# Patient Record
Sex: Female | Born: 1965 | Race: Black or African American | Hispanic: No | Smoking: Never smoker
Health system: Southern US, Community
[De-identification: ages and names within clinical notes are randomized; demographics above are authoritative.]

## PROBLEM LIST (undated history)

## (undated) DIAGNOSIS — R51 Headache: Secondary | ICD-10-CM

## (undated) DIAGNOSIS — F319 Bipolar disorder, unspecified: Secondary | ICD-10-CM

## (undated) DIAGNOSIS — D649 Anemia, unspecified: Secondary | ICD-10-CM

## (undated) DIAGNOSIS — E119 Type 2 diabetes mellitus without complications: Secondary | ICD-10-CM

## (undated) DIAGNOSIS — F419 Anxiety disorder, unspecified: Secondary | ICD-10-CM

## (undated) DIAGNOSIS — I10 Essential (primary) hypertension: Secondary | ICD-10-CM

## (undated) DIAGNOSIS — K589 Irritable bowel syndrome without diarrhea: Secondary | ICD-10-CM

## (undated) DIAGNOSIS — F99 Mental disorder, not otherwise specified: Secondary | ICD-10-CM

## (undated) DIAGNOSIS — F32A Depression, unspecified: Secondary | ICD-10-CM

## (undated) DIAGNOSIS — F329 Major depressive disorder, single episode, unspecified: Secondary | ICD-10-CM

## (undated) HISTORY — DX: Headache: R51

## (undated) HISTORY — PX: NO PAST SURGERIES: SHX2092

## (undated) HISTORY — DX: Irritable bowel syndrome, unspecified: K58.9

## (undated) HISTORY — DX: Type 2 diabetes mellitus without complications: E11.9

---

## 1997-11-27 ENCOUNTER — Emergency Department (HOSPITAL_COMMUNITY): Admission: EM | Admit: 1997-11-27 | Discharge: 1997-11-27 | Payer: Self-pay | Admitting: Emergency Medicine

## 1999-01-19 ENCOUNTER — Other Ambulatory Visit: Admission: RE | Admit: 1999-01-19 | Discharge: 1999-01-19 | Payer: Self-pay | Admitting: Obstetrics and Gynecology

## 1999-02-09 ENCOUNTER — Other Ambulatory Visit: Admission: RE | Admit: 1999-02-09 | Discharge: 1999-02-09 | Payer: Self-pay | Admitting: Obstetrics and Gynecology

## 1999-02-09 ENCOUNTER — Encounter (INDEPENDENT_AMBULATORY_CARE_PROVIDER_SITE_OTHER): Payer: Self-pay | Admitting: Specialist

## 2000-01-21 ENCOUNTER — Encounter: Admission: RE | Admit: 2000-01-21 | Discharge: 2000-01-21 | Payer: Self-pay | Admitting: Family Medicine

## 2000-06-23 ENCOUNTER — Encounter: Admission: RE | Admit: 2000-06-23 | Discharge: 2000-06-23 | Payer: Self-pay | Admitting: Family Medicine

## 2000-10-28 ENCOUNTER — Encounter: Admission: RE | Admit: 2000-10-28 | Discharge: 2000-10-28 | Payer: Self-pay | Admitting: Family Medicine

## 2001-11-07 ENCOUNTER — Encounter (INDEPENDENT_AMBULATORY_CARE_PROVIDER_SITE_OTHER): Payer: Self-pay | Admitting: *Deleted

## 2001-11-07 ENCOUNTER — Encounter: Admission: RE | Admit: 2001-11-07 | Discharge: 2001-11-07 | Payer: Self-pay | Admitting: Family Medicine

## 2001-11-15 ENCOUNTER — Encounter: Admission: RE | Admit: 2001-11-15 | Discharge: 2001-11-15 | Payer: Self-pay | Admitting: Sports Medicine

## 2001-11-15 ENCOUNTER — Encounter: Payer: Self-pay | Admitting: Sports Medicine

## 2001-11-29 ENCOUNTER — Encounter: Admission: RE | Admit: 2001-11-29 | Discharge: 2001-11-29 | Payer: Self-pay | Admitting: Family Medicine

## 2001-12-06 ENCOUNTER — Encounter: Admission: RE | Admit: 2001-12-06 | Discharge: 2001-12-06 | Payer: Self-pay | Admitting: Family Medicine

## 2001-12-13 ENCOUNTER — Encounter: Admission: RE | Admit: 2001-12-13 | Discharge: 2001-12-13 | Payer: Self-pay | Admitting: Family Medicine

## 2001-12-20 ENCOUNTER — Encounter: Admission: RE | Admit: 2001-12-20 | Discharge: 2001-12-20 | Payer: Self-pay | Admitting: Family Medicine

## 2002-01-16 ENCOUNTER — Encounter: Admission: RE | Admit: 2002-01-16 | Discharge: 2002-01-16 | Payer: Self-pay | Admitting: Family Medicine

## 2002-02-14 ENCOUNTER — Encounter: Admission: RE | Admit: 2002-02-14 | Discharge: 2002-02-14 | Payer: Self-pay | Admitting: Sports Medicine

## 2002-04-17 ENCOUNTER — Encounter: Admission: RE | Admit: 2002-04-17 | Discharge: 2002-04-17 | Payer: Self-pay | Admitting: Family Medicine

## 2002-10-18 ENCOUNTER — Encounter (INDEPENDENT_AMBULATORY_CARE_PROVIDER_SITE_OTHER): Payer: Self-pay | Admitting: *Deleted

## 2002-10-18 LAB — CONVERTED CEMR LAB

## 2002-11-07 ENCOUNTER — Encounter: Admission: RE | Admit: 2002-11-07 | Discharge: 2002-11-07 | Payer: Self-pay | Admitting: Family Medicine

## 2003-05-01 ENCOUNTER — Encounter: Admission: RE | Admit: 2003-05-01 | Discharge: 2003-05-01 | Payer: Self-pay | Admitting: Family Medicine

## 2003-11-04 ENCOUNTER — Emergency Department (HOSPITAL_COMMUNITY): Admission: EM | Admit: 2003-11-04 | Discharge: 2003-11-05 | Payer: Self-pay | Admitting: Emergency Medicine

## 2003-11-05 ENCOUNTER — Inpatient Hospital Stay (HOSPITAL_COMMUNITY): Admission: RE | Admit: 2003-11-05 | Discharge: 2003-11-12 | Payer: Self-pay | Admitting: Psychiatry

## 2004-07-26 ENCOUNTER — Inpatient Hospital Stay (HOSPITAL_COMMUNITY): Admission: EM | Admit: 2004-07-26 | Discharge: 2004-07-28 | Payer: Self-pay | Admitting: Emergency Medicine

## 2004-07-28 ENCOUNTER — Ambulatory Visit: Payer: Self-pay | Admitting: Psychiatry

## 2004-07-28 ENCOUNTER — Inpatient Hospital Stay (HOSPITAL_COMMUNITY): Admission: AD | Admit: 2004-07-28 | Discharge: 2004-08-02 | Payer: Self-pay | Admitting: Psychiatry

## 2006-06-16 DIAGNOSIS — N949 Unspecified condition associated with female genital organs and menstrual cycle: Secondary | ICD-10-CM | POA: Insufficient documentation

## 2006-06-16 DIAGNOSIS — B351 Tinea unguium: Secondary | ICD-10-CM | POA: Insufficient documentation

## 2006-06-16 DIAGNOSIS — L2089 Other atopic dermatitis: Secondary | ICD-10-CM | POA: Insufficient documentation

## 2006-06-16 DIAGNOSIS — N393 Stress incontinence (female) (male): Secondary | ICD-10-CM

## 2006-06-17 ENCOUNTER — Encounter (INDEPENDENT_AMBULATORY_CARE_PROVIDER_SITE_OTHER): Payer: Self-pay | Admitting: *Deleted

## 2007-03-23 ENCOUNTER — Encounter: Admission: RE | Admit: 2007-03-23 | Discharge: 2007-03-23 | Payer: Self-pay | Admitting: Family Medicine

## 2007-05-26 ENCOUNTER — Emergency Department (HOSPITAL_COMMUNITY): Admission: EM | Admit: 2007-05-26 | Discharge: 2007-05-26 | Payer: Self-pay | Admitting: Emergency Medicine

## 2010-09-04 NOTE — Discharge Summary (Signed)
NAMERUTHETTA, [name]:  [id number]   MEDICAL RECORD NO.:  [id number]          PATIENT TYPE:  IPS   LOCATION:  0307                          FACILITY:  [facility]   PHYSICIAN:  Jeanice Lim, M.D. DATE OF BIRTH:  [birth year]   DATE OF ADMISSION:  07/28/2004  DATE OF DISCHARGE:  08/02/2004                                 DISCHARGE SUMMARY   IDENTIFYING DATA:  A 45 year old separated African-American female with  history of intention overdose on Tylenol Saturday, July 22, 2004 at home.  Took #50 tablets in an attempt to kill herself.   MEDICATIONS:  Medications in the past Zoloft, Restoril and Xanax.  No  current medications, noncompliant with follow up.   ALLERGIES:  No known drug allergies.   PHYSICAL EXAMINATION:  Physical and neurologic exam within normal limits.   MENTAL STATUS EXAM:  Alert, overweight, cooperative, no eye contact.  Anxious ruminating, positive suicidal thoughts, ambivalent regarding her  current overdose.  Cognitive intact.  Judgment and insight impaired.   ADMISSION DIAGNOSES:  Axis I.  Depressive disorder, recurrent, severe, rule  out post-traumatic stress disorder.  Axis II.  Deferred.  Axis III.  None.  Axis IV.  Moderate.  Axis V.  30/55.   HOSPITAL COURSE:  The patient was admitted, ordered routine p.r.n.'s,  monitored for safety.  Encouraged to participate in therapy as well as the  importance of medication compliance was stressed and the patient reported  regretting overdose, mood improved and the patient was discharged in  improved condition with no suicidal ideation and appearing to have improved  coping skills.  Aware of the need for follow up and treatment.  Her  condition is treatable but untreated she is likely to take her own life and  the patient had no thoughts of this in the future or at the time of  discharge.  Received medication education.   Discharged on:  1.  Zoloft 50 mg daily.  2.  Lamictal 25 mg until August 10, 2004 and then 10 mg daily.  3.  Restoril 0.5 mg q. 8 p.m.   The patient is discharged.  Follow up at [facility] Wednesday, August 05, 2004.   DISCHARGE DIAGNOSES:  Axis I.  Depressive disorder, recurrent, severe, rule  out post-traumatic stress disorder.  Axis II.  Deferred.  Axis III.  None.  Axis IV.  Moderate.  Axis V.  On discharge 75.      JEM/MEDQ  D:  08/27/2004  T:  08/28/2004  Job:  [id number]
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
## 2011-06-16 ENCOUNTER — Ambulatory Visit (HOSPITAL_COMMUNITY)
Admission: RE | Admit: 2011-06-16 | Discharge: 2011-06-16 | Disposition: A | Discharge status: Home / Self Care | Payer: 59 | Attending: Psychiatry | Admitting: Psychiatry

## 2011-06-16 ENCOUNTER — Encounter (HOSPITAL_COMMUNITY): Payer: Self-pay | Admitting: *Deleted

## 2011-06-16 DIAGNOSIS — F411 Generalized anxiety disorder: Secondary | ICD-10-CM | POA: Insufficient documentation

## 2011-06-16 DIAGNOSIS — F4310 Post-traumatic stress disorder, unspecified: Secondary | ICD-10-CM | POA: Insufficient documentation

## 2011-06-16 DIAGNOSIS — F3130 Bipolar disorder, current episode depressed, mild or moderate severity, unspecified: Secondary | ICD-10-CM | POA: Insufficient documentation

## 2011-06-16 HISTORY — DX: Mental disorder, not otherwise specified: F99

## 2011-06-16 HISTORY — DX: Depression, unspecified: F32.A

## 2011-06-16 HISTORY — DX: Major depressive disorder, single episode, unspecified: F32.9

## 2011-06-16 HISTORY — DX: Anxiety disorder, unspecified: F41.9
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
## 2011-06-17 ENCOUNTER — Encounter (HOSPITAL_COMMUNITY): Payer: 59 | Attending: Psychiatry

## 2011-06-17 ENCOUNTER — Encounter (HOSPITAL_COMMUNITY): Payer: Self-pay

## 2011-06-17 DIAGNOSIS — F339 Major depressive disorder, recurrent, unspecified: Secondary | ICD-10-CM

## 2011-06-17 DIAGNOSIS — F4000 Agoraphobia, unspecified: Secondary | ICD-10-CM

## 2011-06-17 NOTE — Progress Notes (Signed)
    Daily Group Progress Note  Program: IOP  Group Time: 9:00-10:30 am   Participation Level: Active  Behavioral Response: Appropriate  Type of Therapy:  Process Group  Summary of Progress: Today was patients first day in the group. She appeared anxious, but observed the group process and began to participate by offering to read a small portion of information being presented during the group.      Group Time: 10:30 am - 12:00 pm   Participation Level:  Active  Behavioral Response: Appropriate  Type of Therapy: Psycho-education Group  Patient participated in a follow up discussion on barriers that impact [place] setting healthy boundaries and discussed ways to challenge negative feelings associated with limit setting.      Maxcine Ham, MSW, LCSW
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
## 2011-06-18 ENCOUNTER — Telehealth (HOSPITAL_COMMUNITY): Payer: Self-pay | Admitting: Psychiatry

## 2011-06-18 ENCOUNTER — Encounter (HOSPITAL_COMMUNITY): Payer: 59 | Attending: Psychiatry

## 2011-06-18 DIAGNOSIS — F4001 Agoraphobia with panic disorder: Secondary | ICD-10-CM | POA: Insufficient documentation

## 2011-06-18 DIAGNOSIS — F332 Major depressive disorder, recurrent severe without psychotic features: Secondary | ICD-10-CM | POA: Insufficient documentation

## 2011-06-21 ENCOUNTER — Encounter (HOSPITAL_COMMUNITY): Payer: 59

## 2011-06-21 DIAGNOSIS — F4001 Agoraphobia with panic disorder: Secondary | ICD-10-CM

## 2011-06-21 NOTE — Progress Notes (Signed)
   Daily Group Progress Note  Program: IOP  Group Time: 0900 - 1030  Participation Level: None  Behavioral Response: NA  Type of Therapy:  Psycho-education Group  Summary of Progress:  Patricia Rodriguez did not participate as she was late arriving     Group Time: 1045 - 1200  Participation Level:  Active  Behavioral Response: Appropriate and Sharing  Type of Therapy: Process Group  Summary of Progress:   Patricia Rodriguez arrived just as we started this group.  She reported she had trouble deciding to leave the house, but she made herself come and drove here on her own and did not have to stop for any reason on the way.  She patted herself on the back to coming.  She was on target with questions to other group members and shared a bit about her struggle with the recent onset of agoraphobia.     Shonna Chock, APRN, CNS  [facility]
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
## 2011-06-22 ENCOUNTER — Encounter (HOSPITAL_COMMUNITY): Payer: 59

## 2011-06-22 NOTE — Progress Notes (Signed)
    Daily Group Progress Note  Program: IOP  Group Time: 9:00-10:30 am   Participation Level: Active  Behavioral Response: Appropriate  Type of Therapy:  Process Group  Summary of Progress: Patient arrived thirty minutes late to group and described her struggle with phobia of leaving her house. Patient was attentive to the discussion but did not participate until called upon to share. Patient described difficulty leaving her home because she can't handle "negative" people. She describes holding in emotion over the years from negative encounters with others and how the inability to release them has caused her to isolate. Patient appears to be getting more comfortable in the group and less guarded.     Group Time: 10:30 am - 12:00 pm   Participation Level:  Active  Behavioral Response: Appropriate  Type of Therapy: Psycho-education Group  Summary of Progress: Patient was introduced to progressive muscle relaxation as a way to decrease stress and promote healthy sleep. Patient practiced the technique and identified how they could use it going forward.  Maxcine Ham, MSW, LCSW

## 2011-06-22 NOTE — Progress Notes (Signed)
Patient ID: Patricia Rodriguez, female   DOB: 1965-04-29, 46 y.o.   MRN: [id number] Pt seen states tolerating the increase in Risperdal 0.5 mg bid well.sleep still poor, discussed increasing Risperdal 0.5 mg am and 1 mg po q pm.

## 2011-06-23 ENCOUNTER — Encounter (HOSPITAL_COMMUNITY): Payer: 59

## 2011-06-24 ENCOUNTER — Encounter (HOSPITAL_COMMUNITY): Payer: 59

## 2011-06-24 NOTE — Progress Notes (Signed)
    Daily Group Progress Note  Program: IOP  Group Time: 9:00-10:30 am   Participation Level: Active  Behavioral Response: Appropriate  Type of Therapy:  Process Group  Summary of Progress: Patient arrived thirty minutes late to the group and described her continued difficulty with getting out of the house in the mornings from her anxiety. Group challenged her on her need to attend, but also provided support and understanding of her difficulty. Patient described how difficult it is for her to get out of bed when her depression is bad. She also said she feels "manic" today and her moods fluctuate.      Group Time: 10:30 am - 12:00 pm   Participation Level:  Active  Behavioral Response: Appropriate  Type of Therapy: Psycho-education Group  Summary of Progress:  Patient discussed depression symptoms and barriers to wellness.   Maxcine Ham, MSW, LCSW

## 2011-06-25 ENCOUNTER — Encounter (HOSPITAL_COMMUNITY): Payer: 59

## 2011-06-25 NOTE — Progress Notes (Signed)
    Daily Group Progress Note  Program: IOP  Group Time: 9:00-10:30 am   Participation Level: Active  Behavioral Response: Appropriate  Type of Therapy:  Process Group  Summary of Progress: Patient arrived on time for the group for the first time and received positive feedback from members on this achievement. Patient described fearing expressing her true feelings to others for fear of their response, but appears to be getting more comfortable in the group setting and on opening up more with others.      Group Time: 10:30 am - 12:00 pm   Participation Level:  Active  Behavioral Response: Appropriate  Type of Therapy: Psycho-education Group  Summary of Progress: Patient defined the term "crisis" and discussed healthy and unhealthy ways of managing during crisis times.   Maxcine Ham, MSW, LCSW

## 2011-06-28 ENCOUNTER — Encounter (HOSPITAL_COMMUNITY): Payer: 59

## 2011-06-28 NOTE — Progress Notes (Signed)
    Daily Group Progress Note  Program: IOP  Group Time: 9:00-10:30 am   Participation Level: Active  Behavioral Response: Appropriate  Type of Therapy:  Process Group  Summary of Progress: Patient arrived on time for group again today and was smiling as she described feeling "good". She is scheduled to end group tomorrow and states her depression has lessoned and she is looking forward to returning to work next week.      Group Time: 10:30 am - 12:00 pm   Participation Level:  Active  Behavioral Response: Appropriate  Type of Therapy: Psycho-education Group  Summary of Progress: Patient was educated on the five stages of grief and identified losses and the stages they are currently experience in the grief process.   Maxcine Ham, MSW, LCSW

## 2011-06-29 ENCOUNTER — Encounter (HOSPITAL_COMMUNITY): Payer: 59

## 2011-06-29 MED ORDER — RISPERIDONE 1 MG PO TABS: 1.0000 mg | ORAL_TABLET | Freq: Two times a day (BID) | ORAL | Status: DC

## 2011-06-29 NOTE — Patient Instructions (Signed)
Patient completed MH-IOP today.  Denies any suicidal ideations.  Reports improved symptoms (decreased depression and anxiety).  Has been applying coping skills that were learned.  Follow up with Abel Presto, [facility] (pt will schedule) and Jake Seats, NP on 06-30-11 @ 1pm.  Encouraged patient to attend support groups and to continue all medications as prescribed.

## 2011-06-29 NOTE — Progress Notes (Signed)
    Daily Group Progress Note  Program: IOP  Group Time: 9:00-10:30 am   Participation Level: Active  Behavioral Response: Appropriate  Type of Therapy:  Process Group  Summary of Progress: Patient shared for the first time today and today was her discharge day. Patient smiled and described wearing a mask and not really feeling any better and not being ready to make the changes necessary to start managing her depression symptoms. She states she would rather wear the mask and pretend everything is ok. Patient discussed her tendency to "overcare" for others resulting in depleting herself and her energy and leading her to attempt suicide in the past. Patient denied suicidal thoughts today and states she feels ready to return to her individual therapist and back to work tomorrow.      Group Time: 10:30 am - 12:00 pm   Participation Level:  Active  Behavioral Response: Appropriate  Type of Therapy: Psycho-education Group  Summary of Progress: Patient participated in a discussion and education segment on taking personal responsibility for mental health wellness and the importance of doing things daily to take control over ensuring overall good mental health.  Maxcine Ham, MSW, LCSW

## 2011-06-29 NOTE — Progress Notes (Unsigned)
[facility] Intensive Outpatient Program Discharge Summary  DEVANEY SEGERS [id number]     3/12/2013Discharge Note  Patient:  Patricia Rodriguez is an 46 y.o., female DOB:  10-12-1965  Date of Admission:  06/17/2011  Date of Discharge:  06/29/2011  Reason for Admission: Depression and agoraphobia  Hospital Course: 46 year old African American female admitted because of severe anxiety agoraphobia depression and panic attacks. Patient was on limited oh, Prozac, Xanax and Bentyl and was also on Risperdal 0.5 mg at at bedtime Patient's risperidone was increased to 0.5 mg twice a day and this helped her agarophobia. Her Risperdal was then increased again to point 5 in the morning and 1 mg at bedtime and this helped her anxiety and panic she was able to get out of her house much easily and her sleep improved. Patient began opening up in group and processing issues and conflicts especially in regards to her friendship. Her anxiety improved and she no longer felt hopeless and helpless. Her appetite was good. Overall she was coping well and doing well scheduled to have some initial anxiety getting out of the house and so her risperidone was increased to 0.5 mg by mouth twice a day. Her panic attacks dissipated She had no side effects from the medications and was coping well sleep and appetite was good mood was good no suicidal or homicidal ideation was present and it was decided to discharge  Mental Status at Discharge: Alert oriented x3, affect is full and bright speech was normal no suicidal or homicidal ideation was present no anxiety was evident. No hallucinations or delusions were present. Recent and remote memory was good judgment and insight were good concentration and recall were good. Suicidal risk minimal  Lab Results: No labs done Current outpatient prescriptions:ALPRAZolam (XANAX) 1 MG tablet, Take 1 mg by mouth 3 (three) times daily as needed., Disp: , Rfl: ;   butalbital-acetaminophen-caffeine (FIORICET WITH CODEINE) 50-325-40-30 MG per capsule, Take 1 capsule by mouth every 4 (four) hours as needed., Disp: , Rfl: ;  dicyclomine (BENTYL) 20 MG tablet, Take 20 mg by mouth every 6 (six) hours., Disp: , Rfl:  FLUoxetine HCl (PROZAC PO), Take 60 mg by mouth 1 day or 1 dose., Disp: , Rfl: ;  lamoTRIgine (LAMICTAL) 200 MG tablet, Take 200 mg by mouth 2 (two) times daily., Disp: , Rfl: ;  risperiDONE (RISPERDAL) 1 MG tablet, Take 1 tablet (1 mg total) by mouth 2 (two) times daily., Disp: 60 tablet, Rfl: 0  Axis Diagnosis:   Axis I: Major Depression, Recurrent severe.            Agorophobia, and panic attacks Axis II: Dependent Personality Traits Axis III:  Past Medical History  Diagnosis Date  . Mental disorder   . Anxiety   . Depression   . IBS (irritable bowel syndrome)   . Headache    Axis IV: economic problems, occupational problems, other psychosocial or environmental problems, problems related to social environment and problems with primary support group Axis V: 61-70 mild symptoms   Level of Care:  OP  Discharge destination:  Home  Is patient on multiple antipsychotic therapies at discharge:  No    Has Patient had three or more failed trials of antipsychotic monotherapy by history:  No  Patient phone:  [phone] (home)  Patient address:   [address] [place] [address],   Follow-up recommendations:  Activity:  As tolerated Diet:  Regular  Comments:  At the time of discharge  patient was not suicidal or homicidal risk and was not psychotic  The patient received suicide prevention pamphlet:  Yes Belongings returned:  Valuables  Margit Banda 06/29/2011, 3:55 PM

## 2011-06-30 ENCOUNTER — Encounter (HOSPITAL_COMMUNITY): Payer: 59

## 2011-07-01 ENCOUNTER — Encounter (HOSPITAL_COMMUNITY): Payer: 59

## 2011-07-02 ENCOUNTER — Encounter (HOSPITAL_COMMUNITY): Payer: 59

## 2011-07-05 ENCOUNTER — Encounter (HOSPITAL_COMMUNITY): Payer: 59

## 2011-07-06 ENCOUNTER — Encounter (HOSPITAL_COMMUNITY): Payer: 59

## 2011-07-07 ENCOUNTER — Encounter (HOSPITAL_COMMUNITY): Payer: 59

## 2011-07-08 ENCOUNTER — Encounter (HOSPITAL_COMMUNITY): Payer: 59

## 2011-07-09 ENCOUNTER — Encounter (HOSPITAL_COMMUNITY): Payer: 59

## 2011-07-12 ENCOUNTER — Encounter (HOSPITAL_COMMUNITY): Payer: 59

## 2011-07-13 ENCOUNTER — Encounter (HOSPITAL_COMMUNITY): Payer: 59

## 2011-07-14 ENCOUNTER — Encounter (HOSPITAL_COMMUNITY): Payer: 59

## 2011-07-19 ENCOUNTER — Emergency Department (HOSPITAL_COMMUNITY)
Admission: EM | Admit: 2011-07-19 | Discharge: 2011-07-19 | Disposition: A | Discharge status: Home / Self Care | Payer: 59 | Attending: Emergency Medicine | Admitting: Emergency Medicine

## 2011-07-19 ENCOUNTER — Encounter (HOSPITAL_COMMUNITY): Payer: Self-pay | Admitting: *Deleted

## 2011-07-19 DIAGNOSIS — F341 Dysthymic disorder: Secondary | ICD-10-CM | POA: Insufficient documentation

## 2011-07-19 DIAGNOSIS — R112 Nausea with vomiting, unspecified: Secondary | ICD-10-CM | POA: Insufficient documentation

## 2011-07-19 DIAGNOSIS — R1013 Epigastric pain: Secondary | ICD-10-CM | POA: Insufficient documentation

## 2011-07-19 DIAGNOSIS — R51 Headache: Secondary | ICD-10-CM

## 2011-07-19 DIAGNOSIS — Z79899 Other long term (current) drug therapy: Secondary | ICD-10-CM | POA: Insufficient documentation

## 2011-07-19 LAB — DIFFERENTIAL
Basophils Absolute: 0 10*3/uL (ref 0.0–0.1)
Basophils Relative: 0 % (ref 0–1)
Eosinophils Relative: 0 % (ref 0–5)
Monocytes Absolute: 0.5 10*3/uL (ref 0.1–1.0)
Neutro Abs: 8.8 10*3/uL — ABNORMAL HIGH (ref 1.7–7.7)

## 2011-07-19 LAB — CBC
HCT: 40 % (ref 36.0–46.0)
Hemoglobin: 12.9 g/dL (ref 12.0–15.0)
MCH: 27.7 pg (ref 26.0–34.0)
MCHC: 32.3 g/dL (ref 30.0–36.0)
MCV: 85.8 fL (ref 78.0–100.0)
Platelets: 480 10*3/uL — ABNORMAL HIGH (ref 150–400)
RBC: 4.66 MIL/uL (ref 3.87–5.11)
RDW: 13.7 % (ref 11.5–15.5)
WBC: 10.6 10*3/uL — ABNORMAL HIGH (ref 4.0–10.5)

## 2011-07-19 LAB — COMPREHENSIVE METABOLIC PANEL
AST: 16 U/L (ref 0–37)
Albumin: 3.6 g/dL (ref 3.5–5.2)
Calcium: 9.6 mg/dL (ref 8.4–10.5)
Chloride: 99 mEq/L (ref 96–112)
Creatinine, Ser: 0.79 mg/dL (ref 0.50–1.10)

## 2011-07-19 LAB — URINALYSIS, ROUTINE W REFLEX MICROSCOPIC
Glucose, UA: NEGATIVE mg/dL
Leukocytes, UA: NEGATIVE
pH: 7 (ref 5.0–8.0)

## 2011-07-19 LAB — PREGNANCY, URINE: Preg Test, Ur: NEGATIVE

## 2011-07-19 MED ORDER — DIPHENHYDRAMINE HCL 50 MG/ML IJ SOLN
25.0000 mg | INTRAMUSCULAR | Status: AC
  Administered 2011-07-19: 25 mg via INTRAVENOUS
  Filled 2011-07-19: qty 1

## 2011-07-19 MED ORDER — GI COCKTAIL ~~LOC~~
30.0000 mL | Freq: Once | ORAL | Status: AC
  Administered 2011-07-19: 30 mL via ORAL
  Filled 2011-07-19: qty 30

## 2011-07-19 MED ORDER — PANTOPRAZOLE SODIUM 40 MG PO TBEC: 40.0000 mg | DELAYED_RELEASE_TABLET | Freq: Every day | ORAL | Status: DC

## 2011-07-19 MED ORDER — METOCLOPRAMIDE HCL 5 MG/ML IJ SOLN
10.0000 mg | Freq: Once | INTRAMUSCULAR | Status: AC
  Administered 2011-07-19: 10 mg via INTRAVENOUS
  Filled 2011-07-19: qty 2

## 2011-07-19 MED ORDER — PANTOPRAZOLE SODIUM 40 MG PO TBEC
40.0000 mg | DELAYED_RELEASE_TABLET | Freq: Once | ORAL | Status: AC
  Administered 2011-07-19: 40 mg via ORAL
  Filled 2011-07-19: qty 1

## 2011-07-19 MED ORDER — ONDANSETRON HCL 4 MG/2ML IJ SOLN
4.0000 mg | Freq: Once | INTRAMUSCULAR | Status: AC
  Administered 2011-07-19: 4 mg via INTRAVENOUS
  Filled 2011-07-19: qty 2

## 2011-07-19 MED ORDER — SODIUM CHLORIDE 0.9 % IV SOLN
Freq: Once | INTRAVENOUS | Status: AC
  Administered 2011-07-19: 03:00:00 via INTRAVENOUS

## 2011-07-19 NOTE — ED Notes (Signed)
Pt c/o sharp upper abdominal pain along with vomiting that began Sunday night. Pt states the pain "comes and goes" but the nausea is constant. Pt has been able to keep water down but hasn't attempted to eat because of the nausea and vomiting.

## 2011-07-19 NOTE — Discharge Instructions (Signed)
Peptic Ulcers Ulcers are small, open craters or sores that develop in the lining of the stomach or the duodenum (the first part of the small intestine). The term peptic ulcer is used to describe both types of ulcers. There are a number of treatments that relieve the discomfort of ulcers. In most cases ulcers do heal.  CAUSES AND COMMON FEATURES OF PEPTIC ULCERS  Peptic ulcers occur only in areas of the digestive system that come in contact with digestive juices. These juices are secreted (given off) by the stomach. They include acid and an enzyme called pepsin that breaks down proteins. Many people with duodenal ulcers have too much digestive juice spilling down from the stomach. Most people with gastric (stomach) ulcers have normal or below normal amounts of stomach acid. Sometimes, when the mucous membrane (protective lining) of the stomach and duodenum does not protect well, this may add to the growth of ulcers. Duodenal ulcers often produces pain in a small area between the breastbone and navel. Pain varies from hunger pain to constant gnawing or burning sensations (feeling). Sometimes the pain is felt during sleep and may awaken the person in the middle of the night. Often the pain occurs two or three hours after eating, when the stomach is empty. Other common symptoms (problems) include overeating for pain relief. Eating relieves the pain of a duodenal ulcer. Gastric ulcer pain may be felt in the same place as the pain of a duodenal ulcer, or slightly higher up. There may also be sensations of feeling full, indigestion, and heartburn. Sometimes pain occurs when the stomach is full. This causes loss of appetite followed by weight loss. HOME CARE INSTRUCTIONS   Use of tobacco products have been found to slow down the healing of an ulcer. STOP SMOKING.   Avoid alcohol, aspirin, and other inflammation (swelling and soreness) reducing drugs. These substances weaken the stomach lining.   Eat regular,  nutritious meals.   Avoid foods that bother you.   Take medications and antacids as directed. Over-the-counter medications are used to neutralize stomach acid. Prescription medications reduce acid secretion, block acid production, or provide a protective coating over the ulcer. If a specific antacid was prescribed, do not switch brands without your caregiver's approval.  Surgery is usually not necessary. Diet and/or drug therapy usually is effective. Surgery may be necessary if perforation, obstruction due to scarring, or uncontrollable bleeding is found, or if severe pain is not otherwise controlled. SEEK IMMEDIATE MEDICAL CARE IF:  You see signs of bleeding. This includes vomiting fresh, bright red blood or passing bloody or tarry, black stools.   You suffer weakness, fatigue, or loss of consciousness. These symptoms can result from severe hemorrhaging (bleeding). Shock may result.   You have sudden, intense, severe abdominal (belly) pain. This is the first sign of a perforation. This would require immediate surgical treatment.   You have intense pain and continued vomiting. This could signal an obstruction of the digestive tract.  Document Released: 04/02/2000 Document Revised: 03/25/2011 Document Reviewed: 04/01/2008 [facility] Patient Information 2012 [place], [place].  Pantoprazole tablets What is this medicine? PANTOPRAZOLE (pan TOE pra zole) prevents the production of acid in the stomach. It is used to treat gastroesophageal reflux disease (GERD), inflammation of the esophagus, and Zollinger-Ellison syndrome. This medicine may be used for other purposes; ask your health care provider or pharmacist if you have questions. What should I tell my health care provider before I take this medicine? They need to know if you have any  of these conditions: -liver disease -low levels of magnesium in the blood -an unusual or allergic reaction to omeprazole, lansoprazole, pantoprazole, rabeprazole,  other medicines, foods, dyes, or preservatives -pregnant or trying to get pregnant -breast-feeding How should I use this medicine? Take this medicine by mouth. Swallow the tablets whole with a drink of water. Follow the directions on the prescription label. Do not crush, break, or chew. Take your medicine at regular intervals. Do not take your medicine more often than directed. Talk to your pediatrician regarding the use of this medicine in children. While this drug may be prescribed for children as young as 5 years for selected conditions, precautions do apply. Overdosage: If you think you have taken too much of this medicine contact a poison control center or emergency room at once. NOTE: This medicine is only for you. Do not share this medicine with others. What if I miss a dose? If you miss a dose, take it as soon as you can. If it is almost time for your next dose, take only that dose. Do not take double or extra doses. What may interact with this medicine? Do not take this medicine with any of the following medications: -atazanavir -nelfinavir This medicine may also interact with the following medications: -ampicillin -delavirdine -digoxin -diuretics -iron salts -medicines for fungal infections like ketoconazole, itraconazole and voriconazole -warfarin This list may not describe all possible interactions. Give your health care provider a list of all the medicines, herbs, non-prescription drugs, or dietary supplements you use. Also tell them if you smoke, drink alcohol, or use illegal drugs. Some items may interact with your medicine. What should I watch for while using this medicine? It can take several days before your stomach pain gets better. Check with your doctor or health care professional if your condition does not start to get better, or if it gets worse. You may need blood work done while you are taking this medicine. What side effects may I notice from receiving this  medicine? Side effects that you should report to your doctor or health care professional as soon as possible: -allergic reactions like skin rash, itching or hives, swelling of the face, lips, or tongue -bone, muscle or joint pain -breathing problems -chest pain or chest tightness -dark yellow or brown urine -dizziness -fast, irregular heartbeat -feeling faint or lightheaded -fever or sore throat -muscle spasm -palpitations -redness, blistering, peeling or loosening of the skin, including inside the mouth -seizures -tremors -unusual bleeding or bruising -unusually weak or tired -yellowing of the eyes or skin Side effects that usually do not require medical attention (Report these to your doctor or health care professional if they continue or are bothersome.): -constipation -diarrhea -dry mouth -headache -nausea This list may not describe all possible side effects. Call your doctor for medical advice about side effects. You may report side effects to FDA at 1-800-FDA-1088. Where should I keep my medicine? Keep out of the reach of children. Store at room temperature between 15 and 30 degrees C (59 and 86 degrees F). Protect from light and moisture. Throw away any unused medicine after the expiration date. NOTE: This sheet is a summary. It may not cover all possible information. If you have questions about this medicine, talk to your doctor, pharmacist, or health care provider.  2012, [employer]vier/[employer] Standard. (06/24/2009 12:03:53 PM)

## 2011-07-19 NOTE — ED Provider Notes (Signed)
History     CSN: [id number]  Arrival date & time 07/19/11  0028   First MD Initiated Contact with Patient 07/19/11 [phone]      Chief Complaint  Patient presents with  . Abdominal Pain  . Headache  . Nausea  . Emesis    (Consider location/radiation/quality/duration/timing/severity/associated sxs/prior treatment) Patient is a 46 y.o. female presenting with abdominal pain, headaches, and vomiting. The history is provided by the patient.  Abdominal Pain The primary symptoms of the illness include abdominal pain and vomiting.  Headache  Associated symptoms include vomiting.  Emesis  Associated symptoms include abdominal pain and headaches.  She had sudden onset at 1900 severe, sharp epigastric pain without radiation. Pain is waxed and waned but is generally at 10/10. There is associated nausea and vomiting. She does not feel any better after vomiting. She denies constipation or diarrhea. She denies fever, chills, sweats. At home, she did not notice anything affecting the pain-nothing made it better nothing made it worse. In the emergency department, she tried taking ice chips orally and pain got worse following that. She does not recall having had pain like this before. She does have a history of irritable bowel syndrome.  Past Medical History  Diagnosis Date  . Mental disorder   . Anxiety   . Depression   . IBS (irritable bowel syndrome)   . Headache     History reviewed. No pertinent past surgical history.  Family History  Problem Relation Age of Onset  . Depression Maternal Aunt     History  Substance Use Topics  . Smoking status: Unknown If Ever Smoked  . Smokeless tobacco: Not on file  . Alcohol Use: No    OB History    Grav Para Term Preterm Abortions TAB SAB Ect Mult Living                  Review of Systems  Gastrointestinal: Positive for vomiting and abdominal pain.  Neurological: Positive for headaches.  All other systems reviewed and are  negative.    Allergies  Vicodin  Home Medications   Current Outpatient Rx  Name Route Sig Dispense Refill  . ALPRAZOLAM 1 MG PO TABS Oral Take 1 mg by mouth 3 (three) times daily as needed.    Marland Kitchen BUTALBITAL-APAP-CAFF-COD 50-325-40-30 MG PO CAPS Oral Take 1 capsule by mouth every 4 (four) hours as needed.    Marland Kitchen DICYCLOMINE HCL 20 MG PO TABS Oral Take 20 mg by mouth every 6 (six) hours.    Marland Kitchen PROZAC PO Oral Take 60 mg by mouth 1 day or 1 dose.    Marland Kitchen LAMOTRIGINE 200 MG PO TABS Oral Take 200 mg by mouth 2 (two) times daily.    Marland Kitchen RISPERIDONE 1 MG PO TABS Oral Take 0.5 mg by mouth 2 (two) times daily.      BP 138/80  Pulse 75  Temp(Src) 99.4 F (37.4 C) (Oral)  Resp 20  SpO2 98%  Physical Exam  Nursing note and vitals reviewed.  46 year old female who is resting comfortably and in no acute distress. Vital signs are normal. Oxygen saturation is 98% which is normal. She is obese. Head is no stock and atraumatic. PERRLA, EOMI. There is no scleral icterus. Oropharynx is clear. Neck is nontender and supple. Back is nontender. Lungs are clear without rales, wheezes, or rhonchi. Heart has regular rate and rhythm without murmur. Abdomen is soft, flat, with some mild tenderness in the epigastric area without rebound or guarding. There  no masses or hepatosplenomegaly. Peristalsis is diminished but present. Extremities have no cyanosis or edema, full range of motion is present. Skin is warm and dry without rash. Neurologic: Mental status is normal, cranial nerves are intact, there no focal motor or sensory deficits.  ED Course  Procedures (including critical care time)  Results for orders placed during the hospital encounter of 07/19/11  CBC      Component Value Range   WBC 10.6 (*) 4.0 - 10.5 (K/uL)   RBC 4.66  3.87 - 5.11 (MIL/uL)   Hemoglobin 12.9  12.0 - 15.0 (g/dL)   HCT 16.1  09.6 - 04.5 (%)   MCV 85.8  78.0 - 100.0 (fL)   MCH 27.7  26.0 - 34.0 (pg)   MCHC 32.3  30.0 - 36.0 (g/dL)   RDW  40.9  81.1 - 91.4 (%)   Platelets 480 (*) 150 - 400 (K/uL)  DIFFERENTIAL      Component Value Range   Neutrophils Relative 83 (*) 43 - 77 (%)   Neutro Abs 8.8 (*) 1.7 - 7.7 (K/uL)   Lymphocytes Relative 12  12 - 46 (%)   Lymphs Abs 1.3  0.7 - 4.0 (K/uL)   Monocytes Relative 5  3 - 12 (%)   Monocytes Absolute 0.5  0.1 - 1.0 (K/uL)   Eosinophils Relative 0  0 - 5 (%)   Eosinophils Absolute 0.0  0.0 - 0.7 (K/uL)   Basophils Relative 0  0 - 1 (%)   Basophils Absolute 0.0  0.0 - 0.1 (K/uL)  COMPREHENSIVE METABOLIC PANEL      Component Value Range   Sodium 137  135 - 145 (mEq/L)   Potassium 4.3  3.5 - 5.1 (mEq/L)   Chloride 99  96 - 112 (mEq/L)   CO2 31  19 - 32 (mEq/L)   Glucose, Bld 110 (*) 70 - 99 (mg/dL)   BUN 9  6 - 23 (mg/dL)   Creatinine, Ser 7.82  0.50 - 1.10 (mg/dL)   Calcium 9.6  8.4 - 95.6 (mg/dL)   Total Protein 7.5  6.0 - 8.3 (g/dL)   Albumin 3.6  3.5 - 5.2 (g/dL)   AST 16  0 - 37 (U/L)   ALT 16  0 - 35 (U/L)   Alkaline Phosphatase 63  39 - 117 (U/L)   Total Bilirubin 0.2 (*) 0.3 - 1.2 (mg/dL)   GFR calc non Af Amer >90  >90 (mL/min)   GFR calc Af Amer >90  >90 (mL/min)  LIPASE, BLOOD      Component Value Range   Lipase 18  11 - 59 (U/L)  URINALYSIS, ROUTINE W REFLEX MICROSCOPIC      Component Value Range   Color, Urine YELLOW  YELLOW    APPearance CLOUDY (*) CLEAR    Specific Gravity, Urine 1.014  1.005 - 1.030    pH 7.0  5.0 - 8.0    Glucose, UA NEGATIVE  NEGATIVE (mg/dL)   Hgb urine dipstick NEGATIVE  NEGATIVE    Bilirubin Urine NEGATIVE  NEGATIVE    Ketones, ur NEGATIVE  NEGATIVE (mg/dL)   Protein, ur NEGATIVE  NEGATIVE (mg/dL)   Urobilinogen, UA 0.2  0.0 - 1.0 (mg/dL)   Nitrite NEGATIVE  NEGATIVE    Leukocytes, UA NEGATIVE  NEGATIVE   PREGNANCY, URINE      Component Value Range   Preg Test, Ur NEGATIVE  NEGATIVE    She got good relief of abdominal pain with a GI cocktail. Started complaining  of a migraine headache and was given a dose of metoclopramide  and diphenhydramine intravenously with good relief of her headache. She will be discharged with a prescription for proton ex.  1. Epigastric pain   2. Headache       MDM  Abdominal pain which seems most likely to be either gastritis or ulcer. She'll be given a therapeutic trial of a GI cocktail and laboratory workup initiated.        Dione Booze, MD 07/19/11 [phone]

## 2013-06-13 ENCOUNTER — Other Ambulatory Visit (HOSPITAL_COMMUNITY): Payer: 59 | Attending: Psychiatry | Admitting: Psychiatry

## 2013-06-13 ENCOUNTER — Encounter (HOSPITAL_COMMUNITY): Payer: Self-pay

## 2013-06-13 DIAGNOSIS — F411 Generalized anxiety disorder: Secondary | ICD-10-CM | POA: Insufficient documentation

## 2013-06-13 DIAGNOSIS — K589 Irritable bowel syndrome without diarrhea: Secondary | ICD-10-CM | POA: Insufficient documentation

## 2013-06-13 DIAGNOSIS — F429 Obsessive-compulsive disorder, unspecified: Secondary | ICD-10-CM

## 2013-06-13 DIAGNOSIS — F332 Major depressive disorder, recurrent severe without psychotic features: Secondary | ICD-10-CM | POA: Insufficient documentation

## 2013-06-13 DIAGNOSIS — F410 Panic disorder [episodic paroxysmal anxiety] without agoraphobia: Secondary | ICD-10-CM | POA: Insufficient documentation

## 2013-06-13 DIAGNOSIS — F4001 Agoraphobia with panic disorder: Secondary | ICD-10-CM

## 2013-06-13 DIAGNOSIS — F314 Bipolar disorder, current episode depressed, severe, without psychotic features: Secondary | ICD-10-CM

## 2013-06-13 MED ORDER — MIRTAZAPINE 15 MG PO TABS: 15.0000 mg | ORAL_TABLET | Freq: Every day | ORAL | Status: DC
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
## 2013-06-13 NOTE — Progress Notes (Signed)
    Daily Group Progress Note  Program: IOP  Group Time: 9:00-10:30  Participation Level: Active  Behavioral Response: Appropriate  Type of Therapy:  Group Therapy  Summary of Progress: Pt. Met with case manager and psychiatrist.     Group Time: 10:30-12:00  Participation Level:  Active  Behavioral Response: Appropriate  Type of Therapy: Psycho-education Group  Summary of Progress: Pt. Met with case manager and psychiatrist.  Zenon Leaf B, COUNS 

## 2013-06-14 ENCOUNTER — Other Ambulatory Visit (HOSPITAL_COMMUNITY): Payer: 59

## 2013-06-15 ENCOUNTER — Other Ambulatory Visit (HOSPITAL_COMMUNITY): Payer: 59 | Admitting: Psychiatry
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
## 2013-06-18 ENCOUNTER — Other Ambulatory Visit (HOSPITAL_COMMUNITY): Payer: 59 | Attending: Family Medicine

## 2013-06-18 DIAGNOSIS — F3130 Bipolar disorder, current episode depressed, mild or moderate severity, unspecified: Secondary | ICD-10-CM | POA: Insufficient documentation

## 2013-06-18 DIAGNOSIS — F4310 Post-traumatic stress disorder, unspecified: Secondary | ICD-10-CM | POA: Insufficient documentation

## 2013-06-18 DIAGNOSIS — K589 Irritable bowel syndrome without diarrhea: Secondary | ICD-10-CM | POA: Insufficient documentation

## 2013-06-18 DIAGNOSIS — F411 Generalized anxiety disorder: Secondary | ICD-10-CM | POA: Insufficient documentation

## 2013-06-19 ENCOUNTER — Encounter (HOSPITAL_COMMUNITY): Payer: Self-pay | Admitting: Psychiatry

## 2013-06-19 ENCOUNTER — Other Ambulatory Visit (HOSPITAL_COMMUNITY): Payer: 59 | Admitting: Psychiatry

## 2013-06-19 DIAGNOSIS — F32A Depression, unspecified: Secondary | ICD-10-CM

## 2013-06-19 DIAGNOSIS — F329 Major depressive disorder, single episode, unspecified: Secondary | ICD-10-CM

## 2013-06-19 NOTE — Progress Notes (Signed)
    Daily Group Progress Note  Program: IOP  Group Time: 9:00-10:30  Participation Level: Active  Behavioral Response: Appropriate  Type of Therapy:  Group Therapy  Summary of Progress: Pt. Discussed history of depression, work-related stress, tendency toward perfectionism and not reaching out to others for support. Pt. Also discussed relationship with dependent mother, caregiving for adult step-daughter and managing contentious relationship with step-daughter's biological mother.   Group Time: 10:30-12:00  Participation Level:  Active  Behavioral Response: Appropriate  Type of Therapy: Psycho-education Group  Summary of Progress: Pt. Participated in group focused on developing healthy boundaries.  Nancie Neas, COUNS

## 2013-06-20 ENCOUNTER — Encounter (HOSPITAL_COMMUNITY): Payer: Self-pay | Admitting: Psychiatry

## 2013-06-20 ENCOUNTER — Other Ambulatory Visit (HOSPITAL_COMMUNITY): Payer: 59 | Admitting: Psychiatry

## 2013-06-20 DIAGNOSIS — F32A Depression, unspecified: Secondary | ICD-10-CM

## 2013-06-20 DIAGNOSIS — F329 Major depressive disorder, single episode, unspecified: Secondary | ICD-10-CM

## 2013-06-20 NOTE — Progress Notes (Signed)
    Daily Group Progress Note  Program: IOP  Group Time: 9:00-10:30  Participation Level: Minimal  Behavioral Response: Drowsy  Type of Therapy:  Group Therapy  Summary of Progress: Pt. Participated in heartmath and guided visualization. Pt. Continues to communicate positive attitude toward the group process and demonstrates motivation to address stressors and build coping skills. Pt. Presented as drowsy and apologized to the group for falling asleep. Pt. Attributed drowsiness to difficulty sleeping last night.     Group Time: 10:30-12:00  Participation Level:  Minimal  Behavioral Response:  Drowsy  Type of Therapy: Psycho-education Group  Summary of Progress: Pt. Continued to present as drowsy during group focused on developing healthy boundaries in relationships.  Nancie Neas, COUNS

## 2013-06-21 ENCOUNTER — Telehealth (HOSPITAL_COMMUNITY): Payer: Self-pay | Admitting: Psychiatry

## 2013-06-21 ENCOUNTER — Other Ambulatory Visit (HOSPITAL_COMMUNITY): Payer: 59 | Admitting: Psychiatry

## 2013-06-22 ENCOUNTER — Other Ambulatory Visit (HOSPITAL_COMMUNITY): Payer: 59 | Admitting: Psychiatry

## 2013-06-22 ENCOUNTER — Encounter (HOSPITAL_COMMUNITY): Payer: Self-pay | Admitting: Psychiatry

## 2013-06-22 DIAGNOSIS — F32A Depression, unspecified: Secondary | ICD-10-CM

## 2013-06-22 DIAGNOSIS — F329 Major depressive disorder, single episode, unspecified: Secondary | ICD-10-CM

## 2013-06-22 NOTE — Progress Notes (Signed)
    Daily Group Progress Note  Program: IOP  Group Time: 9:00-10:30  Participation Level: Active  Behavioral Response: Appropriate and Sharing  Type of Therapy:  Group Therapy  Summary of Progress: Pt. Participated in heartmath and guided meditation and affirmation exercise. Pt. Discussed feelings related to her divorce, home, and desire to change her home and work environment.     Group Time: 10:30-12:00  Participation Level:  Active  Behavioral Response: Appropriate and Sharing  Type of Therapy: Psycho-education Group  Summary of Progress: Pt. Participated in values clarification exercise.  Nancie Neas, COUNS

## 2013-06-25 ENCOUNTER — Encounter (HOSPITAL_COMMUNITY): Payer: Self-pay | Admitting: Psychiatry

## 2013-06-25 ENCOUNTER — Other Ambulatory Visit (HOSPITAL_COMMUNITY): Payer: 59 | Admitting: Psychiatry

## 2013-06-25 DIAGNOSIS — F329 Major depressive disorder, single episode, unspecified: Secondary | ICD-10-CM

## 2013-06-25 DIAGNOSIS — F32A Depression, unspecified: Secondary | ICD-10-CM

## 2013-06-25 NOTE — Progress Notes (Signed)
   Daily Group Progress Note  Program: IOP  Group Time: 9:00-10:30   Participation Level: Active   Behavioral Response: Appropriate and Sharing   Type of Therapy: Group Therapy   Summary of Progress: Pt. Participated in heartmath. Pt. Participated in discussion about the challenge of developing wellness behaviors including sleep routine, nutrition, and exercise. Pt. Reports that she has slept 2-4 hours a night for the past year, sleep is interrupted throughout the night.    Group Time: 10:30-12:00   Participation Level: Active   Behavioral Response: Appropriate and Sharing   Type of Therapy: Psycho-education Group   Summary of Progress: Pt. Participated in grief/loss group facilitated by Jeanella Craze.     Nancie Neas, COUNS

## 2013-06-26 ENCOUNTER — Other Ambulatory Visit (HOSPITAL_COMMUNITY): Payer: 59 | Admitting: Psychiatry

## 2013-06-26 ENCOUNTER — Encounter (HOSPITAL_COMMUNITY): Payer: Self-pay | Admitting: Psychiatry

## 2013-06-26 DIAGNOSIS — F329 Major depressive disorder, single episode, unspecified: Secondary | ICD-10-CM

## 2013-06-26 DIAGNOSIS — F32A Depression, unspecified: Secondary | ICD-10-CM

## 2013-06-26 NOTE — Progress Notes (Signed)
    Daily Group Progress Note  Program: IOP  Group Time: 9:00-10:30  Participation Level: Active  Behavioral Response: Appropriate  Type of Therapy:  Group Therapy  Summary of Progress: Pt. Participated in heartmath meditation. Pt. Participated in discussion about the nature of forgiveness and relationship losses.     Group Time: 10:30-12:00  Participation Level:  Active  Behavioral Response: Appropriate and Sharing  Type of Therapy: Psycho-education Group  Summary of Progress: Pt. Participated in discussion about expectations and stages of loss of a perceived entitlement.  Nancie Neas, COUNS

## 2013-06-27 ENCOUNTER — Other Ambulatory Visit (HOSPITAL_COMMUNITY): Payer: 59 | Admitting: Psychiatry

## 2013-06-28 ENCOUNTER — Encounter (HOSPITAL_COMMUNITY): Payer: Self-pay | Admitting: Psychiatry

## 2013-06-28 ENCOUNTER — Other Ambulatory Visit (HOSPITAL_COMMUNITY): Payer: 59 | Admitting: Psychiatry

## 2013-06-28 DIAGNOSIS — F329 Major depressive disorder, single episode, unspecified: Secondary | ICD-10-CM

## 2013-06-28 DIAGNOSIS — F32A Depression, unspecified: Secondary | ICD-10-CM

## 2013-06-28 NOTE — Progress Notes (Signed)
    Daily Group Progress Note  Program: IOP  Group Time: 9:00-10:30  Participation Level: Active  Behavioral Response: Appropriate and Sharing  Type of Therapy:  Group Therapy  Summary of Progress: Pt. Participated in heartmath and morning meditation. Pt. Shared challenge of agoraphobia, challenge of finding her voice and asserting herself in her relationship with her mother, and loss of former self due to depression.     Group Time: 10:30-12:00  Participation Level:  Active  Behavioral Response: Appropriate  Type of Therapy: Psycho-education Group  Summary of Progress: Pt. Participated in group on developing routine and countering negative self-talk.  Nancie Neas, COUNS

## 2013-06-29 ENCOUNTER — Other Ambulatory Visit (HOSPITAL_COMMUNITY): Payer: 59 | Admitting: Psychiatry

## 2013-06-29 ENCOUNTER — Encounter (HOSPITAL_COMMUNITY): Payer: Self-pay | Admitting: Psychiatry

## 2013-06-29 DIAGNOSIS — F32A Depression, unspecified: Secondary | ICD-10-CM

## 2013-06-29 DIAGNOSIS — F329 Major depressive disorder, single episode, unspecified: Secondary | ICD-10-CM

## 2013-06-29 NOTE — Progress Notes (Signed)
    Daily Group Progress Note  Program: IOP  Group Time: 9:00-10:30  Participation Level: Active  Behavioral Response: Appropriate  Type of Therapy:  Group Therapy  Summary of Progress: Pt. Participated in heartmath and morning meditation. Pt. Reported continued problems with sleep disturbance, reported concerns about medication and discontinuing remeron. Pt. Looking forward to sleep study and finding solutions to problems with sleep. Pt. Discussed positive coping tools including reading paranormal romance novels.     Group Time: 10:30-12:00  Participation Level:  Active  Behavioral Response: Appropriate  Type of Therapy: Psycho-education Group  Summary of Progress: Pt. Participated in group focused on developing distress tolerance behaviors.  Nancie Neas, COUNS

## 2013-07-02 ENCOUNTER — Other Ambulatory Visit (HOSPITAL_COMMUNITY): Payer: 59 | Admitting: Psychiatry

## 2013-07-03 ENCOUNTER — Other Ambulatory Visit (HOSPITAL_COMMUNITY): Payer: 59 | Admitting: Psychiatry

## 2013-07-04 ENCOUNTER — Encounter (HOSPITAL_COMMUNITY): Payer: Self-pay | Admitting: Psychiatry

## 2013-07-04 ENCOUNTER — Other Ambulatory Visit (HOSPITAL_COMMUNITY): Payer: 59 | Admitting: Psychiatry

## 2013-07-04 DIAGNOSIS — F32A Depression, unspecified: Secondary | ICD-10-CM

## 2013-07-04 DIAGNOSIS — F329 Major depressive disorder, single episode, unspecified: Secondary | ICD-10-CM

## 2013-07-04 NOTE — Progress Notes (Signed)
    Daily Group Progress Note  Program: IOP  Group Time: 9:00-10:30  Participation Level: Active  Behavioral Response: Appropriate  Type of Therapy:  Group Therapy  Summary of Progress: Pt. Participated in heartmath and reflective meditation. Pt. Shared that she continues to be challenged by agoraphobia and motivation to engage in self-care activities.     Group Time: 10:30-12:00  Participation Level:  Active  Behavioral Response: Appropriate  Type of Therapy: Psycho-education Group  Summary of Progress: Pt. Participated in discussion about healthy boundary setting.  Nancie Neas, COUNS

## 2013-07-05 ENCOUNTER — Other Ambulatory Visit (HOSPITAL_COMMUNITY): Payer: 59 | Admitting: Psychiatry

## 2013-07-05 DIAGNOSIS — F32A Depression, unspecified: Secondary | ICD-10-CM

## 2013-07-05 DIAGNOSIS — F329 Major depressive disorder, single episode, unspecified: Secondary | ICD-10-CM

## 2013-07-05 NOTE — Patient Instructions (Signed)
Patient completed MH-IOP today.  Patient to follow up with [name], [facility] on 07-05-13 @ 2pm and [name], NP @ 1pm.  Encouraged support groups.

## 2013-07-05 NOTE — Progress Notes (Unsigned)
  [facility] [facility] Discharge Summary  Patricia Rodriguez [id number]  Admission date: 06/13/2013 Discharge date: 07/05/2013  Reason for admission: Agoraphobia and MDD  Chemical Use History: Denies  Family of Origin Issues: None  Progress in Program Toward Treatment Goals: patient reports that she has achieved her goals and is now able to leave her home.Patient notes that she is ready to discharge from IOP as 10 days was all that was covered. She has good follow up in place and will see her therapist and her psychiatrist for medication management.    Progress (rationale): Patient is alert and oriented x 3.She denies SI/HI or AVH. She rates her depression as improved, and her anxiety as decreased.  Marlane Hatcher. Jeymi Hepp [facility] 07/05/2013 11:56 AM

## 2013-07-05 NOTE — Progress Notes (Signed)
Patient ID: Patricia Rodriguez, female   DOB: [birth year], 48 y.o.   MRN: [id number] D: This is a 48 yr old, divorced, African American, female who was referred per her therapist [name], [facility]), treatment for worsening depressive and anxiety symptoms. Pt c/o of increased isolation, hopelessness, poor hygiene, no motivation, decreased sleep (3 hrs), panic attacks, and passive suicidal thoughts. Pt denied intent or plan. Pt reported two previous suicide attempts (overdoses) in 2005 and 2006, in which she was admitted on the inpatient unit at [facility]. Stated that her symptoms worsened in December 2014 after her birthday on the [birth year] th of [birth year]. Family Hx: Maternal aunt struggles with depression and their are other maternal aunts and uncles with drugs and ETOH addictions. Stressors: 1) Job ([employer]) of nine years. Reports that the company allowed her to start working from home in 2013, due to her Agoraphobia. Pt states although, she has been working from home, her productivity has decreased and she's not meeting her STATS. 2) Financial Strain. Been on intermittent FMLA since August 2014 and on short term disability since 05-21-13. 3) No support system.  Pt completed MH-IOP today.  Reports decreased sadness, improved appetite and sleep.  Continues to c/o poor self esteem, indecisiveness, irritability, anhedonia, decreased motivation and passive SI.  Denies intent.  Able to contract for safety.  Denies HI or A/V hallucinations.  Pt would like to continue working on maintaining structure and coping skills.  RTW on 07-16-13; without any restrictions.  A:  D/C today.  F/U with [name], NP in six weeks and [name], [facility] today or to be rescheduled.  Encouraged support groups.  R:  Pt receptive.

## 2013-07-06 NOTE — Progress Notes (Signed)
    Daily Group Progress Note  Program: IOP  Group Time: 9:00-10:30  Participation Level: Active  Behavioral Response: Appropriate  Type of Therapy:  Group Therapy  Summary of Progress: Pt. Participated in heartmath and guided visualization. Pt. Processed challenges anticipated with discharge and return to work. Pt. Reports minimal anxiety and planning to make changes to her workspace to help manage times when she feels anxious.     Group Time: 10:30-12:00  Participation Level:  Active  Behavioral Response: Appropriate  Type of Therapy: Psycho-education Group  Summary of Progress: Pt. Participated in discussion about developing healthy relationship boundaries. Pt. Participated in discharge ceremony.  [facility]

## 2013-07-09 ENCOUNTER — Other Ambulatory Visit (HOSPITAL_COMMUNITY): Payer: 59

## 2013-07-10 ENCOUNTER — Other Ambulatory Visit (HOSPITAL_COMMUNITY): Payer: 59 | Admitting: Psychiatry

## 2013-07-10 DIAGNOSIS — F329 Major depressive disorder, single episode, unspecified: Secondary | ICD-10-CM

## 2013-07-10 DIAGNOSIS — F32A Depression, unspecified: Secondary | ICD-10-CM

## 2013-07-10 NOTE — Progress Notes (Signed)
    Daily Group Progress Note  Program: IOP  Group Time: 9:00-10:30  Participation Level: Active  Behavioral Response: Appropriate  Type of Therapy:  Group Therapy  Summary of Progress: Pt.'s first day back to group since her discharge Thursday of last week. Pt. Reports that she saw her therapist on Thursday afternoon and began to feel anxious about her return to work and felt that she could benefit from a few more days in group. Pt. Presented as tired, lethargic, smiled and shared appropriately in group about determining healthy boundaries in relationships.     Group Time: 10:30-12:00  Participation Level:  Active  Behavioral Response: Appropriate  Type of Therapy: Psycho-education Group  Summary of Progress: Pt. Participated in discussion about how to find self; discussed themes from [employer], [place], Love.  Nancie Neas, COUNS

## 2013-07-11 ENCOUNTER — Other Ambulatory Visit (HOSPITAL_COMMUNITY): Payer: 59 | Admitting: Psychiatry

## 2013-07-11 ENCOUNTER — Encounter (HOSPITAL_COMMUNITY): Payer: Self-pay | Admitting: Psychiatry

## 2013-07-11 DIAGNOSIS — F32A Depression, unspecified: Secondary | ICD-10-CM

## 2013-07-11 DIAGNOSIS — F329 Major depressive disorder, single episode, unspecified: Secondary | ICD-10-CM

## 2013-07-11 NOTE — Progress Notes (Signed)
    Daily Group Progress Note  Program: IOP  Group Time: 9:00-10:30  Participation Level: Active  Behavioral Response: Appropriate and Sharing  Type of Therapy:  Psycho-education Group  Summary of Progress: Pt. Reported that she was feeling positive mood, bright affect, talked and laughed appropriately. Pt. Reports some mild anxiety related to return to work, ongoing challenge of setting appropriate boundaries with her mother.     Group Time: 10:30-12:00  Participation Level:  Active  Behavioral Response: Appropriate  Type of Therapy: Psycho-education Group  Summary of Progress: Pt. Participated in Part 2 of the journey of self-discovery discussion with themes from [employer], [place], Love.  Nancie Neas, COUNS

## 2013-07-12 ENCOUNTER — Other Ambulatory Visit (HOSPITAL_COMMUNITY): Payer: 59 | Admitting: Psychiatry

## 2013-07-12 ENCOUNTER — Encounter (HOSPITAL_COMMUNITY): Payer: Self-pay | Admitting: Psychiatry

## 2013-07-12 DIAGNOSIS — F329 Major depressive disorder, single episode, unspecified: Secondary | ICD-10-CM

## 2013-07-12 DIAGNOSIS — F32A Depression, unspecified: Secondary | ICD-10-CM

## 2013-07-12 NOTE — Progress Notes (Signed)
    Daily Group Progress Note  Program: IOP  Group Time: 9:00-10:30  Participation Level: Active  Behavioral Response: Appropriate  Type of Therapy:  Group Therapy  Summary of Progress: Pt. Presented as alert, sharing, and supportive of other group members. Pt. shared that she was having a good day.     Group Time: 10:30-12:00  Participation Level:  Active  Behavioral Response: Appropriate  Type of Therapy: Psycho-education Group  Summary of Progress: Pt. Participated in discussion and presentation about [place] from [place].  Nancie Neas, COUNS

## 2013-07-13 ENCOUNTER — Other Ambulatory Visit (HOSPITAL_COMMUNITY): Payer: 59 | Admitting: Psychiatry

## 2013-07-13 ENCOUNTER — Encounter (HOSPITAL_COMMUNITY): Payer: Self-pay | Admitting: Psychiatry

## 2013-07-13 DIAGNOSIS — F32A Depression, unspecified: Secondary | ICD-10-CM

## 2013-07-13 DIAGNOSIS — F329 Major depressive disorder, single episode, unspecified: Secondary | ICD-10-CM

## 2013-07-13 NOTE — Progress Notes (Signed)
    Daily Group Progress Note  Program: IOP  Group Time: 9:00-10:30  Participation Level: Active  Behavioral Response: Appropriate  Type of Therapy:  Group Therapy  Summary of Progress: Pt. Alert, bright affect, smiles and laughs appropriately. Pt. Receptive to feedback from the group about developing healthy self-care and developing appropriate relationship boundaries.     Group Time: 10:30-12:00  Participation Level:  Active  Behavioral Response: Appropriate  Type of Therapy: Psycho-education Group  Summary of Progress: Pt. Participated in discussion about [employer], developing vulnerability and worthiness in relationships.  Nancie Neas, COUNS

## 2013-07-13 NOTE — Progress Notes (Signed)
Patient ID: Patricia Rodriguez, female   DOB: [birth year], 48 y.o.   MRN: [id number] Patient in the chart were reviewed today, case was discussed with case manager and patient was seen face-to-face. Patient states that her panic attacks have decreased and she is only experiencing one panic attack we continues to be Agorophobic, has been sleeping a little bit better and it is able to sleep 4 hours denies flashbacks or nightmares. Patient states that the Remeron was discontinued due to hypersomnia and she is now back on her Prozac and lithium was increased to 40 mg every day. Patient wants to curb her appetite as she states she has been binging again and has gained about 20 pounds. Discussed exercising and walking 3000 steps per day patient is willing to do that also discussed healthy snacks and eating patient is willing to try. Denies suicidal or homicidal ideation, anxiety  appears to have improved. No hallucinations or delusions. Coping significantly better.

## 2013-07-16 ENCOUNTER — Other Ambulatory Visit (HOSPITAL_COMMUNITY): Payer: 59

## 2013-07-17 ENCOUNTER — Encounter (HOSPITAL_COMMUNITY): Payer: Self-pay | Admitting: Psychiatry

## 2013-07-17 ENCOUNTER — Other Ambulatory Visit (HOSPITAL_COMMUNITY): Payer: 59 | Admitting: Psychiatry

## 2013-07-17 DIAGNOSIS — F329 Major depressive disorder, single episode, unspecified: Secondary | ICD-10-CM

## 2013-07-17 DIAGNOSIS — F32A Depression, unspecified: Secondary | ICD-10-CM

## 2013-07-17 MED ORDER — ALPRAZOLAM 1 MG PO TABS: 1.0000 mg | ORAL_TABLET | Freq: Three times a day (TID) | ORAL | Status: DC | PRN

## 2013-07-17 NOTE — Progress Notes (Signed)
    Daily Group Progress Note  Program: IOP  Group Time: 9:00-10:30  Participation Level: Active  Behavioral Response: Appropriate  Type of Therapy:  Group Therapy  Summary of Progress: Pt. Participated in meditation and body scan. Pt. Shared some mild anxiety about discharge, but shared success of getting out of the house more and spending time with family.     Group Time: 10:30-12:00  Participation Level:  Active  Behavioral Response: Appropriate  Type of Therapy: Psycho-education Group  Summary of Progress: Pt. Participated in discussion about developing self-compassion based on Kristin Neff's self-compassion model.  Nancie Neas, COUNS

## 2013-07-17 NOTE — Progress Notes (Signed)
Patient ID: Patricia Rodriguez, female   DOB: [birth year], 48 y.o.   MRN: [id number] D: This is a 48 yr old, divorced, African American, female who was referred per her therapist [name], [facility]), treatment for worsening depressive and anxiety symptoms. Pt c/o of increased isolation, hopelessness, poor hygiene, no motivation, decreased sleep (3 hrs), panic attacks, and passive suicidal thoughts. Pt denies intent or plan. Pt is able to contract for safety. Denies HI or A/V hallucinations. Pt reports two previous suicide attempts (overdoses) in 2005 and 2006, in which she was admitted on the inpatient unit at [facility]. States that her symptoms worsened in December 2014 after her birthday on the [birth year]. Family Hx: Maternal aunt struggles with depression and their are other maternal aunts and uncles with drugs and ETOH addictions. Stressors: 1) Job ([employer]) of nine years. Reports that the company allowed her to start working from home in 2013, due to her Agoraphobia. Pt states although, she has been working from home, her productivity has decreased and she's not meeting her STATS. 2) Financial Strain. Been on intermittent FMLA since August 2014 and on short term disability since 05-21-13. 3) No support system.  Pt was not discharged on 07-05-13, pt's therapist called that afternoon stating that she felt that pt needed one more week in [place] due to recent medication change.  This Probation officer called [employer] and was able to get one more week.  Pt reports feeling much better.  Improved sleep.  Decreased anxiety and depression.  Denies SI/HI or A/V hallucinations.  A:  D/C today.  F/U with [name], NP and [name], LPC.  Encouraged support groups.  Return to work when disability co states.  R:  Pt receptive.

## 2013-07-17 NOTE — Patient Instructions (Signed)
Patient completed MH-IOP today.  Will follow up with [name], [facility] and [name], NP.  Will return to work when [employer] states.  Encouraged support groups.
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
## 2013-07-19 ENCOUNTER — Encounter (HOSPITAL_COMMUNITY): Payer: Self-pay | Admitting: Psychiatry

## 2014-01-07 ENCOUNTER — Emergency Department (HOSPITAL_COMMUNITY)
Admission: EM | Admit: 2014-01-07 | Discharge: 2014-01-08 | Disposition: A | Discharge status: Home / Self Care | Payer: BC Managed Care – PPO | Attending: Emergency Medicine | Admitting: Emergency Medicine

## 2014-01-07 ENCOUNTER — Encounter (HOSPITAL_COMMUNITY): Payer: Self-pay | Admitting: Emergency Medicine

## 2014-01-07 DIAGNOSIS — F329 Major depressive disorder, single episode, unspecified: Secondary | ICD-10-CM | POA: Insufficient documentation

## 2014-01-07 DIAGNOSIS — X58XXXA Exposure to other specified factors, initial encounter: Secondary | ICD-10-CM | POA: Insufficient documentation

## 2014-01-07 DIAGNOSIS — Y939 Activity, unspecified: Secondary | ICD-10-CM | POA: Insufficient documentation

## 2014-01-07 DIAGNOSIS — Z79899 Other long term (current) drug therapy: Secondary | ICD-10-CM | POA: Diagnosis not present

## 2014-01-07 DIAGNOSIS — F411 Generalized anxiety disorder: Secondary | ICD-10-CM | POA: Diagnosis not present

## 2014-01-07 DIAGNOSIS — S139XXA Sprain of joints and ligaments of unspecified parts of neck, initial encounter: Secondary | ICD-10-CM | POA: Insufficient documentation

## 2014-01-07 DIAGNOSIS — Y929 Unspecified place or not applicable: Secondary | ICD-10-CM | POA: Insufficient documentation

## 2014-01-07 DIAGNOSIS — F3289 Other specified depressive episodes: Secondary | ICD-10-CM | POA: Diagnosis not present

## 2014-01-07 DIAGNOSIS — G43909 Migraine, unspecified, not intractable, without status migrainosus: Secondary | ICD-10-CM | POA: Diagnosis not present

## 2014-01-07 DIAGNOSIS — K589 Irritable bowel syndrome without diarrhea: Secondary | ICD-10-CM | POA: Diagnosis not present

## 2014-01-07 DIAGNOSIS — G44209 Tension-type headache, unspecified, not intractable: Secondary | ICD-10-CM | POA: Diagnosis not present

## 2014-01-07 DIAGNOSIS — S161XXA Strain of muscle, fascia and tendon at neck level, initial encounter: Secondary | ICD-10-CM

## 2014-01-07 NOTE — ED Notes (Signed)
Pt presents with c/o migraine. Pt has a hx of migraines. Pt reports light sensitivity, vomiting and neck stiffness with this migraine. Pt reports these are symptoms of her migraines but that her migraines do not usually last this long.

## 2014-01-08 MED ORDER — DIPHENHYDRAMINE HCL 25 MG PO CAPS
25.0000 mg | ORAL_CAPSULE | Freq: Once | ORAL | Status: AC
  Administered 2014-01-08: 25 mg via ORAL
  Filled 2014-01-08: qty 1

## 2014-01-08 MED ORDER — METHOCARBAMOL 500 MG PO TABS
1000.0000 mg | ORAL_TABLET | Freq: Once | ORAL | Status: AC
  Administered 2014-01-08: 1000 mg via ORAL
  Filled 2014-01-08: qty 2

## 2014-01-08 MED ORDER — METHOCARBAMOL 500 MG PO TABS: 500.0000 mg | ORAL_TABLET | Freq: Three times a day (TID) | ORAL | Status: DC | PRN

## 2014-01-08 MED ORDER — IBUPROFEN 600 MG PO TABS
600.0000 mg | ORAL_TABLET | Freq: Four times a day (QID) | ORAL | Status: DC | PRN
Start: 2014-01-08 — End: 2014-07-03

## 2014-01-08 MED ORDER — METOCLOPRAMIDE HCL 10 MG PO TABS: 10.0000 mg | ORAL_TABLET | Freq: Four times a day (QID) | ORAL | Status: DC | PRN

## 2014-01-08 MED ORDER — KETOROLAC TROMETHAMINE 60 MG/2ML IM SOLN
60.0000 mg | Freq: Once | INTRAMUSCULAR | Status: AC
  Administered 2014-01-08: 60 mg via INTRAMUSCULAR
  Filled 2014-01-08: qty 2

## 2014-01-08 MED ORDER — METOCLOPRAMIDE HCL 10 MG PO TABS
10.0000 mg | ORAL_TABLET | Freq: Once | ORAL | Status: AC
  Administered 2014-01-08: 10 mg via ORAL
  Filled 2014-01-08: qty 1

## 2014-01-08 NOTE — ED Provider Notes (Signed)
CSN: [id number]     Arrival date & time 01/07/14  2338 History   First MD Initiated Contact with Patient 01/08/14 0005     Chief Complaint  Patient presents with  . Migraine     (Consider location/radiation/quality/duration/timing/severity/associated sxs/prior Treatment) HPI Patient says she's been under increased stress at home. She complains of a temporal headache that radiates around to the occiput and down the neck. It is worse with palpation. Gradual onset 4 days ago. She's had no fever or chills. Denies URI symptoms. States she has associated photophobia and phonophobia. She's been out of her normal migraine medication. She has no focal weakness or numbness. Past Medical History  Diagnosis Date  . Mental disorder   . Anxiety   . Depression   . IBS (irritable bowel syndrome)   . Headache(784.0)    History reviewed. No pertinent past surgical history. Family History  Problem Relation Age of Onset  . Depression Maternal Aunt   . Alcohol abuse Maternal Aunt   . Drug abuse Maternal Aunt   . Drug abuse Father   . Alcohol abuse Paternal Aunt   . Drug abuse Paternal Aunt   . Alcohol abuse Maternal Uncle   . Drug abuse Maternal Uncle   . Alcohol abuse Paternal Uncle   . Drug abuse Paternal Uncle    History  Substance Use Topics  . Smoking status: Never Smoker   . Smokeless tobacco: Not on file  . Alcohol Use: No   OB History   Grav Para Term Preterm Abortions TAB SAB Ect Mult Living                 Review of Systems  Constitutional: Negative for fever and chills.  HENT: Negative for congestion, ear pain, sinus pressure and tinnitus.   Eyes: Positive for photophobia.  Respiratory: Negative for chest tightness and shortness of breath.   Gastrointestinal: Positive for nausea. Negative for vomiting, abdominal pain and diarrhea.  Genitourinary: Negative for dysuria, hematuria and flank pain.  Musculoskeletal: Positive for myalgias and neck pain. Negative for arthralgias,  back pain and neck stiffness.  Skin: Negative for rash and wound.  Neurological: Positive for headaches. Negative for dizziness, syncope, weakness, light-headedness and numbness.  All other systems reviewed and are negative.     Allergies  Vicodin  Home Medications   Prior to Admission medications   Medication Sig Start Date End Date Taking? Authorizing Provider  ALPRAZolam Duanne Moron) 1 MG tablet Take 1 tablet (1 mg total) by mouth 3 (three) times daily as needed. 07/17/13  Yes Leonides Grills, MD  dicyclomine (BENTYL) 20 MG tablet Take 20 mg by mouth every 6 (six) hours as needed (for irritable bowel syndrome).    Yes Historical Provider, MD  FLUoxetine HCl 60 MG TABS Take 60 mg by mouth daily. 12/06/13  Yes Historical Provider, MD  HYDROcodone-acetaminophen (NORCO/VICODIN) 5-325 MG per tablet Take 1 tablet by mouth once.   Yes Historical Provider, MD  lurasidone (LATUDA) 40 MG TABS tablet Take 40 mg by mouth daily.    Yes Historical Provider, MD   BP 158/99  Pulse 113  Temp(Src) 99.2 F (37.3 C) (Oral)  Resp 18  SpO2 94% Physical Exam  Nursing note and vitals reviewed. Constitutional: She is oriented to person, place, and time. She appears well-developed and well-nourished. No distress.  HENT:  Head: Normocephalic and atraumatic.  Mouth/Throat: Oropharynx is clear and moist.  This is tenderness with percussion. Patient does have tenderness to palpation in the  occiput bilaterally. No temporal artery tenderness.  Eyes: EOM are normal. Pupils are equal, round, and reactive to light.  Neck: Normal range of motion. Neck supple.  Full range of motion without nuchal rigidity. Patient does have bilateral cervical paraspinal tenderness with palpation. Spasm noted.  Cardiovascular: Normal rate and regular rhythm.   Pulmonary/Chest: Effort normal and breath sounds normal. No respiratory distress. She has no wheezes. She has no rales.  Abdominal: Soft. Bowel sounds are normal. She  exhibits no distension and no mass. There is no tenderness. There is no rebound and no guarding.  Musculoskeletal: Normal range of motion. She exhibits no edema and no tenderness.  Neurological: She is alert and oriented to person, place, and time.  Patient is alert and oriented x3 with clear, goal oriented speech. Patient has 5/5 motor in all extremities. Sensation is intact to light touch. Bilateral finger-to-nose is normal with no signs of dysmetria. Patient has a normal gait and walks without assistance.   Skin: Skin is warm and dry. No rash noted. No erythema.  Psychiatric: She has a normal mood and affect. Her behavior is normal.    ED Course  Procedures (including critical care time) Labs Review Labs Reviewed - No data to display  Imaging Review No results found.   EKG Interpretation None      MDM   Final diagnoses:  None    Headache likely due to combination of tension and migraine symptoms. We'll treat and reevaluate.  Headache improved. Patient is asking to be discharged home. Continues to have a normal neurologic exam. Return precautions given.  Julianne Rice, MD 01/08/14 0300

## 2014-01-08 NOTE — Discharge Instructions (Signed)

## 2014-01-16 ENCOUNTER — Ambulatory Visit (HOSPITAL_COMMUNITY): Admission: RE | Admit: 2014-01-16 | Payer: BC Managed Care – PPO | Source: Home / Self Care | Admitting: Psychiatry

## 2014-01-16 ENCOUNTER — Encounter (HOSPITAL_COMMUNITY): Payer: Self-pay | Admitting: Emergency Medicine

## 2014-01-16 ENCOUNTER — Emergency Department (HOSPITAL_COMMUNITY)
Admission: EM | Admit: 2014-01-16 | Discharge: 2014-01-17 | Disposition: A | Discharge status: Home / Self Care | Payer: BC Managed Care – PPO | Attending: Emergency Medicine | Admitting: Emergency Medicine

## 2014-01-16 DIAGNOSIS — F332 Major depressive disorder, recurrent severe without psychotic features: Secondary | ICD-10-CM

## 2014-01-16 DIAGNOSIS — F329 Major depressive disorder, single episode, unspecified: Secondary | ICD-10-CM | POA: Diagnosis not present

## 2014-01-16 DIAGNOSIS — R457 State of emotional shock and stress, unspecified: Secondary | ICD-10-CM | POA: Insufficient documentation

## 2014-01-16 DIAGNOSIS — Z79899 Other long term (current) drug therapy: Secondary | ICD-10-CM | POA: Diagnosis not present

## 2014-01-16 DIAGNOSIS — K589 Irritable bowel syndrome without diarrhea: Secondary | ICD-10-CM | POA: Diagnosis not present

## 2014-01-16 DIAGNOSIS — F419 Anxiety disorder, unspecified: Secondary | ICD-10-CM | POA: Insufficient documentation

## 2014-01-16 DIAGNOSIS — F411 Generalized anxiety disorder: Secondary | ICD-10-CM | POA: Diagnosis not present

## 2014-01-16 DIAGNOSIS — R45851 Suicidal ideations: Secondary | ICD-10-CM | POA: Insufficient documentation

## 2014-01-16 DIAGNOSIS — F3289 Other specified depressive episodes: Secondary | ICD-10-CM | POA: Diagnosis not present

## 2014-01-16 DIAGNOSIS — F430 Acute stress reaction: Secondary | ICD-10-CM | POA: Diagnosis not present

## 2014-01-16 LAB — COMPREHENSIVE METABOLIC PANEL
ALBUMIN: 3.4 g/dL — AB (ref 3.5–5.2)
ALK PHOS: 70 U/L (ref 39–117)
ALT: 15 U/L (ref 0–35)
ANION GAP: 10 (ref 5–15)
AST: 12 U/L (ref 0–37)
BILIRUBIN TOTAL: 0.2 mg/dL — AB (ref 0.3–1.2)
BUN: 9 mg/dL (ref 6–23)
CO2: 29 mEq/L (ref 19–32)
CREATININE: 0.83 mg/dL (ref 0.50–1.10)
Calcium: 9.1 mg/dL (ref 8.4–10.5)
Chloride: 99 mEq/L (ref 96–112)
GFR calc non Af Amer: 83 mL/min — ABNORMAL LOW (ref >90)
GLUCOSE: 93 mg/dL (ref 70–99)
POTASSIUM: 3.8 meq/L (ref 3.7–5.3)
Sodium: 138 mEq/L (ref 137–147)
TOTAL PROTEIN: 7.9 g/dL (ref 6.0–8.3)

## 2014-01-16 LAB — RAPID URINE DRUG SCREEN, HOSP PERFORMED
Amphetamines: NOT DETECTED
BARBITURATES: POSITIVE — AB
Benzodiazepines: POSITIVE — AB
Cocaine: NOT DETECTED
OPIATES: NOT DETECTED
TETRAHYDROCANNABINOL: NOT DETECTED

## 2014-01-16 LAB — SALICYLATE LEVEL: Salicylate Lvl: 2.5 mg/dL — ABNORMAL LOW (ref 2.8–20.0)

## 2014-01-16 LAB — CBC
HEMATOCRIT: 40.4 % (ref 36.0–46.0)
HEMOGLOBIN: 13 g/dL (ref 12.0–15.0)
MCH: 26.5 pg (ref 26.0–34.0)
MCHC: 32.2 g/dL (ref 30.0–36.0)
MCV: 82.4 fL (ref 78.0–100.0)
Platelets: 450 10*3/uL — ABNORMAL HIGH (ref 150–400)
RBC: 4.9 MIL/uL (ref 3.87–5.11)
RDW: 14.1 % (ref 11.5–15.5)
WBC: 8.3 10*3/uL (ref 4.0–10.5)

## 2014-01-16 LAB — ETHANOL

## 2014-01-16 LAB — ACETAMINOPHEN LEVEL

## 2014-01-16 MED ORDER — ZOLPIDEM TARTRATE 5 MG PO TABS: 5.0000 mg | ORAL_TABLET | Freq: Every evening | ORAL | Status: DC | PRN

## 2014-01-16 MED ORDER — METOCLOPRAMIDE HCL 10 MG PO TABS: 10.0000 mg | ORAL_TABLET | Freq: Four times a day (QID) | ORAL | Status: DC | PRN

## 2014-01-16 MED ORDER — LORAZEPAM 1 MG PO TABS
1.0000 mg | ORAL_TABLET | Freq: Three times a day (TID) | ORAL | Status: DC | PRN
Start: 2014-01-16 — End: 2014-01-16

## 2014-01-16 MED ORDER — METHOCARBAMOL 500 MG PO TABS: 500.0000 mg | ORAL_TABLET | Freq: Three times a day (TID) | ORAL | Status: DC | PRN

## 2014-01-16 MED ORDER — NICOTINE 21 MG/24HR TD PT24: 21.0000 mg | MEDICATED_PATCH | Freq: Every day | TRANSDERMAL | Status: DC

## 2014-01-16 MED ORDER — LURASIDONE HCL 40 MG PO TABS
40.0000 mg | ORAL_TABLET | Freq: Every day | ORAL | Status: DC
  Administered 2014-01-17: 40 mg via ORAL
  Filled 2014-01-16: qty 1

## 2014-01-16 MED ORDER — FLUOXETINE HCL 20 MG PO CAPS
60.0000 mg | ORAL_CAPSULE | Freq: Every day | ORAL | Status: DC
  Administered 2014-01-17: 60 mg via ORAL
  Filled 2014-01-16: qty 3

## 2014-01-16 MED ORDER — ONDANSETRON HCL 4 MG PO TABS: 4.0000 mg | ORAL_TABLET | Freq: Three times a day (TID) | ORAL | Status: DC | PRN

## 2014-01-16 MED ORDER — IBUPROFEN 200 MG PO TABS: 600.0000 mg | ORAL_TABLET | Freq: Three times a day (TID) | ORAL | Status: DC | PRN

## 2014-01-16 MED ORDER — ALUM & MAG HYDROXIDE-SIMETH 200-200-20 MG/5ML PO SUSP: 30.0000 mL | ORAL | Status: DC | PRN

## 2014-01-16 MED ORDER — DICYCLOMINE HCL 20 MG PO TABS: 20.0000 mg | ORAL_TABLET | Freq: Four times a day (QID) | ORAL | Status: DC | PRN

## 2014-01-16 MED ORDER — ALPRAZOLAM 1 MG PO TABS: 1.0000 mg | ORAL_TABLET | Freq: Three times a day (TID) | ORAL | Status: DC | PRN

## 2014-01-16 NOTE — ED Provider Notes (Signed)
CSN: [id number]     Arrival date & time 01/16/14  2017 History   First MD Initiated Contact with Patient 01/16/14 2114     Chief Complaint  Patient presents with  . Suicidal     (Consider location/radiation/quality/duration/timing/severity/associated sxs/prior Treatment) The history is provided by the patient and medical records. No language interpreter was used.    Patricia Rodriguez is a 48 y.o. female  with a hx of anxiety, agoraphobia presents to the Emergency Department complaining of gradual, persistent, progressively worsening SI onset 6 days ago.  Pt reports that she feels like she doesn't want to get out of bed, take a shower or eat.  Pt reports Hx of 2 suicide attempts via overdose. Pt reports that her current living site is being bought.  She is stressed about her finances.  Pt is being treated for her depression with prozac, xanax, latuda.  She denies all somatic symptoms.  She HI, auditory or visual hallucinations.  Pt denies drug or EtOH usage.   Past Medical History  Diagnosis Date  . Mental disorder   . Anxiety   . Depression   . IBS (irritable bowel syndrome)   . Headache(784.0)    History reviewed. No pertinent past surgical history. Family History  Problem Relation Age of Onset  . Depression Maternal Aunt   . Alcohol abuse Maternal Aunt   . Drug abuse Maternal Aunt   . Drug abuse Father   . Alcohol abuse Paternal Aunt   . Drug abuse Paternal Aunt   . Alcohol abuse Maternal Uncle   . Drug abuse Maternal Uncle   . Alcohol abuse Paternal Uncle   . Drug abuse Paternal Uncle    History  Substance Use Topics  . Smoking status: Never Smoker   . Smokeless tobacco: Not on file  . Alcohol Use: No   OB History   Grav Para Term Preterm Abortions TAB SAB Ect Mult Living                 Review of Systems  Constitutional: Negative for fever, diaphoresis, appetite change, fatigue and unexpected weight change.  HENT: Negative for mouth sores.   Eyes: Negative for  visual disturbance.  Respiratory: Negative for cough, chest tightness, shortness of breath and wheezing.   Cardiovascular: Negative for chest pain.  Gastrointestinal: Negative for nausea, vomiting, abdominal pain, diarrhea and constipation.  Endocrine: Negative for polydipsia, polyphagia and polyuria.  Genitourinary: Negative for dysuria, urgency, frequency and hematuria.  Musculoskeletal: Negative for back pain and neck stiffness.  Skin: Negative for rash.  Allergic/Immunologic: Negative for immunocompromised state.  Neurological: Negative for syncope, light-headedness and headaches.  Hematological: Does not bruise/bleed easily.  Psychiatric/Behavioral: Positive for suicidal ideas. Negative for sleep disturbance. The patient is nervous/anxious.       Allergies  Vicodin  Home Medications   Prior to Admission medications   Medication Sig Start Date End Date Taking? Authorizing Provider  ALPRAZolam Duanne Moron) 1 MG tablet Take 1 tablet (1 mg total) by mouth 3 (three) times daily as needed. 07/17/13  Yes Leonides Grills, MD  butalbital-acetaminophen-caffeine (FIORICET, ESGIC) 50-325-40 MG per tablet Take 1 tablet by mouth every 4 (four) hours as needed. headache 01/09/14  Yes Historical Provider, MD  dicyclomine (BENTYL) 20 MG tablet Take 20 mg by mouth every 6 (six) hours as needed (for irritable bowel syndrome).    Yes Historical Provider, MD  FLUoxetine HCl 60 MG TABS Take 60 mg by mouth daily. 12/06/13  Yes Historical Provider,  MD  HYDROcodone-acetaminophen (NORCO/VICODIN) 5-325 MG per tablet Take 1 tablet by mouth once.   Yes Historical Provider, MD  lurasidone (LATUDA) 40 MG TABS tablet Take 40 mg by mouth daily.    Yes Historical Provider, MD  ibuprofen (ADVIL,MOTRIN) 600 MG tablet Take 1 tablet (600 mg total) by mouth every 6 (six) hours as needed. 01/08/14   Julianne Rice, MD  methocarbamol (ROBAXIN) 500 MG tablet Take 1 tablet (500 mg total) by mouth every 8 (eight) hours as  needed for muscle spasms. 01/08/14   Julianne Rice, MD  metoCLOPramide (REGLAN) 10 MG tablet Take 1 tablet (10 mg total) by mouth every 6 (six) hours as needed for nausea (nausea/headache). 01/08/14   Julianne Rice, MD   BP 140/91  Pulse 83  Temp(Src) 99.1 F (37.3 C) (Oral)  Resp 16  SpO2 96% Physical Exam  Nursing note and vitals reviewed. Constitutional: She appears well-developed and well-nourished. No distress.  Awake, alert, nontoxic appearance  HENT:  Head: Normocephalic and atraumatic.  Mouth/Throat: Oropharynx is clear and moist. No oropharyngeal exudate.  Eyes: Conjunctivae are normal. No scleral icterus.  Neck: Normal range of motion. Neck supple.  Cardiovascular: Normal rate, regular rhythm and intact distal pulses.   Pulmonary/Chest: Effort normal and breath sounds normal. No respiratory distress. She has no wheezes.  Equal chest expansion  Abdominal: Soft. Bowel sounds are normal. She exhibits no mass. There is no tenderness. There is no rebound and no guarding.  Musculoskeletal: Normal range of motion. She exhibits no edema.  Neurological: She is alert.  Speech is clear and goal oriented Moves extremities without ataxia  Skin: Skin is warm and dry. She is not diaphoretic.  Psychiatric: She is not actively hallucinating. She exhibits a depressed mood. She expresses suicidal ideation. She expresses no suicidal plans and no homicidal plans.    ED Course  Procedures (including critical care time) Labs Review Labs Reviewed  CBC - Abnormal; Notable for the following:    Platelets 450 (*)    All other components within normal limits  COMPREHENSIVE METABOLIC PANEL - Abnormal; Notable for the following:    Albumin 3.4 (*)    Total Bilirubin 0.2 (*)    GFR calc non Af Amer 83 (*)    All other components within normal limits  SALICYLATE LEVEL - Abnormal; Notable for the following:    Salicylate Lvl 2.5 (*)    All other components within normal limits  URINE RAPID  DRUG SCREEN (HOSP PERFORMED) - Abnormal; Notable for the following:    Benzodiazepines POSITIVE (*)    Barbiturates POSITIVE (*)    All other components within normal limits  ACETAMINOPHEN LEVEL  ETHANOL    Imaging Review No results found.   EKG Interpretation None      MDM   Final diagnoses:  Suicidal ideation   Patricia Rodriguez presents with SI and hc of suicide attempt in the past.  Pt is here voluntarily however I advised her if she attempts to leave we will have to complete IVC paperwork.  Pt will need TTS evaluation.  Labs pending.   11:22 PM Labs reassuring.  Patient continues to await TTS assessment.  12:23 AM Pt evaluated by Lear Ng, LPC.  Her note reports:    Per Patriciaann Clan, PA pt meets inpt criteria and should be transferred to [facility] for clearance. There are no [facility] beds at this time. TTS to seek placement. Charge RN notified that pt is on her way. Pt agreeable with plan.  BP 140/91  Pulse 83  Temp(Src) 99.1 F (37.3 C) (Oral)  Resp 16  SpO2 96%     Abigail Butts, PA-C 01/17/14 0024
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
## 2014-01-16 NOTE — ED Notes (Signed)
Pt states she has a desire to commit suicide. Pt states when she looks at her pills she thinks " I could just not have a care in the world and take them all." Pt states "sometimes I think what is the use of waking up." Pt states she has a beautiful family and a good job. Pt reports she is having a situation with her home being sold (she's renting it). 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 Pt states "I still don't understand why the 33 of tylenol did not work." Pt states "I am tired, here in my soul, I have let the world drain the positive energy." Pt states she has therapist but it does not help. Pt states in order to leave the house she has to take her anxiety medication and even then she has a 4 hour limit that she can be out before she needs another pill.

## 2014-01-16 NOTE — ED Notes (Signed)
Bed: CVU13 Expected date:  Expected time:  Means of arrival:  Comments: Hold for triage 7

## 2014-01-17 DIAGNOSIS — F332 Major depressive disorder, recurrent severe without psychotic features: Secondary | ICD-10-CM

## 2014-01-17 DIAGNOSIS — R45851 Suicidal ideations: Secondary | ICD-10-CM | POA: Diagnosis present

## 2014-01-17 DIAGNOSIS — Z79899 Other long term (current) drug therapy: Secondary | ICD-10-CM | POA: Diagnosis not present

## 2014-01-17 DIAGNOSIS — F419 Anxiety disorder, unspecified: Secondary | ICD-10-CM | POA: Diagnosis not present

## 2014-01-17 DIAGNOSIS — F329 Major depressive disorder, single episode, unspecified: Secondary | ICD-10-CM | POA: Diagnosis not present

## 2014-01-17 DIAGNOSIS — R457 State of emotional shock and stress, unspecified: Secondary | ICD-10-CM | POA: Diagnosis not present

## 2014-01-17 DIAGNOSIS — K589 Irritable bowel syndrome without diarrhea: Secondary | ICD-10-CM | POA: Diagnosis not present

## 2014-01-17 NOTE — Progress Notes (Addendum)
CSW confirmed with Roderic Palau at [facility] that patient is accepted voluntarily.   Noreene Larsson [phone]  ED CSW 01/17/2014 09:09am   CSW confirmed with nurse who will arrange transport with [facility], and call report to [facility].  Noreene Larsson [phone]  ED CSW 01/17/2014 10:00am

## 2014-01-17 NOTE — Progress Notes (Signed)
Accepted to [facility], per Izora Gala, by Dr.Thotakura to [employer] [facility], report number [phone]. Patient can be transported at anytime.

## 2014-01-17 NOTE — ED Notes (Signed)
Pt accepted to [facility].
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
## 2014-01-17 NOTE — BH Assessment (Signed)
Pt meets inpt criteria and there are currently no [facility] beds available. Faxed referrals to the following for possible placement: [place], [place] Triage Specialist 01/17/2014 1:57 AM

## 2014-01-17 NOTE — BH Assessment (Signed)
Pt has been accepted to [facility] per Clarene Critchley and can come at anytime. Pt should arrive to lobby and let them know she is voluntary.   She will be under the care of Dr. Elaina Hoops, and reports can be called to [phone].   Informed SAPU of placement.   Lear Ng, [facility] Triage Specialist 01/17/2014 7:05 AM

## 2014-01-21 NOTE — ED Provider Notes (Signed)
Medical screening examination/treatment/procedure(s) were performed by non-physician practitioner and as supervising physician I was immediately available for consultation/collaboration.  Leota Jacobsen, MD 01/21/14 1026

## 2014-04-02 ENCOUNTER — Other Ambulatory Visit (HOSPITAL_COMMUNITY): Payer: BLUE CROSS/BLUE SHIELD | Admitting: Psychiatry

## 2014-04-02 ENCOUNTER — Encounter (HOSPITAL_COMMUNITY): Payer: Self-pay

## 2014-04-02 DIAGNOSIS — F4310 Post-traumatic stress disorder, unspecified: Secondary | ICD-10-CM

## 2014-04-02 DIAGNOSIS — F410 Panic disorder [episodic paroxysmal anxiety] without agoraphobia: Secondary | ICD-10-CM | POA: Diagnosis not present

## 2014-04-02 DIAGNOSIS — F3130 Bipolar disorder, current episode depressed, mild or moderate severity, unspecified: Secondary | ICD-10-CM

## 2014-04-02 DIAGNOSIS — K589 Irritable bowel syndrome without diarrhea: Secondary | ICD-10-CM | POA: Diagnosis not present

## 2014-04-02 DIAGNOSIS — F319 Bipolar disorder, unspecified: Secondary | ICD-10-CM | POA: Insufficient documentation

## 2014-04-02 DIAGNOSIS — F411 Generalized anxiety disorder: Secondary | ICD-10-CM | POA: Diagnosis not present

## 2014-04-02 DIAGNOSIS — R45851 Suicidal ideations: Secondary | ICD-10-CM | POA: Diagnosis not present
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
## 2014-04-03 ENCOUNTER — Other Ambulatory Visit (HOSPITAL_COMMUNITY): Payer: BLUE CROSS/BLUE SHIELD | Admitting: Psychiatry

## 2014-04-03 DIAGNOSIS — F3130 Bipolar disorder, current episode depressed, mild or moderate severity, unspecified: Secondary | ICD-10-CM

## 2014-04-03 DIAGNOSIS — F319 Bipolar disorder, unspecified: Secondary | ICD-10-CM | POA: Diagnosis not present

## 2014-04-03 NOTE — Progress Notes (Signed)
    Daily Group Progress Note  Program: IOP  Group Time: 9:00-10:30  Participation Level: Active  Behavioral Response: Appropriate  Type of Therapy:  Group Therapy  Summary of Progress: Pt. Discussed recent diagnosis of agoraphobia. Pt. Reported absence of self-care. Pt. Reported that it is hard for her to get out of bed because of her depression. Pt. Reports that she does not eat well and does not take her medications consistently. Pt. Reported that she takes better care of others than herself.      Group Time: 10:30-12:00  Participation Level:  Active  Behavioral Response: Appropriate  Type of Therapy: Psycho-education Group  Summary of Progress: Pt. Participated in discussion about developing a self-care plan.   Nancie Neas, LPC

## 2014-04-04 ENCOUNTER — Other Ambulatory Visit (HOSPITAL_COMMUNITY): Payer: BLUE CROSS/BLUE SHIELD | Attending: Psychiatry | Admitting: Psychiatry

## 2014-04-04 DIAGNOSIS — F319 Bipolar disorder, unspecified: Secondary | ICD-10-CM | POA: Diagnosis not present

## 2014-04-04 DIAGNOSIS — F3130 Bipolar disorder, current episode depressed, mild or moderate severity, unspecified: Secondary | ICD-10-CM

## 2014-04-04 NOTE — Progress Notes (Signed)
    Daily Group Progress Note  Program: IOP  Group Time: 9:00-10:30  Participation Level: Active  Behavioral Response: Appropriate  Type of Therapy:  Group Therapy  Summary of Progress: Pt. Reported history of experiencing winter blues/seasonal affective disorder. Pt. Discussed challenges of living with agoraphobia. Pt. Discussed her mother who continues to be very dependent on her. Pt. Was very sleepy, nodded often, reported that she slept about 4 hours last night.      Group Time: 10:30-12:00  Participation Level:  Active  Behavioral Response: Appropriate  Type of Therapy: Psycho-education Group  Summary of Progress: Pt. Participated in development of self activity with "I am...poem".   Nancie Neas, LPC

## 2014-04-05 ENCOUNTER — Other Ambulatory Visit (HOSPITAL_COMMUNITY): Payer: BLUE CROSS/BLUE SHIELD | Admitting: Psychiatry

## 2014-04-05 NOTE — Progress Notes (Signed)
    Daily Group Progress Note  Program: IOP  Group Time: 9:00-10:30  Participation Level: Active  Behavioral Response: Appropriate  Type of Therapy:  Group Therapy  Summary of Progress: Pt. Smiled and laughed appropriately. Pt. Actively discussed family who lived with her and significant energy that she spent providing a loving and disciplined home for the children. Pt. Discussed lack of social engagement due to agoraphobia.      Group Time: 10:30-12:00  Participation Level:  Active  Behavioral Response: Appropriate  Type of Therapy: Psycho-education Group  Summary of Progress: Pt. Participated in group facilitated by the mental health association.   Nancie Neas, LPC

## 2014-04-08 ENCOUNTER — Other Ambulatory Visit (HOSPITAL_COMMUNITY): Payer: BLUE CROSS/BLUE SHIELD

## 2014-04-09 ENCOUNTER — Other Ambulatory Visit (HOSPITAL_COMMUNITY): Payer: BLUE CROSS/BLUE SHIELD | Admitting: Psychiatry

## 2014-04-09 DIAGNOSIS — F3130 Bipolar disorder, current episode depressed, mild or moderate severity, unspecified: Secondary | ICD-10-CM

## 2014-04-09 DIAGNOSIS — F319 Bipolar disorder, unspecified: Secondary | ICD-10-CM | POA: Diagnosis not present

## 2014-04-10 ENCOUNTER — Other Ambulatory Visit (HOSPITAL_COMMUNITY): Payer: BLUE CROSS/BLUE SHIELD

## 2014-04-11 ENCOUNTER — Other Ambulatory Visit (HOSPITAL_COMMUNITY): Payer: BLUE CROSS/BLUE SHIELD

## 2014-04-11 NOTE — Progress Notes (Signed)
    Daily Group Progress Note  Program: IOP  Group Time: 9:00-10:30  Participation Level: Active  Behavioral Response: Appropriate  Type of Therapy:  Group Therapy  Summary of Progress: Pt. Presented with bright mood, talked and laughed appropriately. Pt. Continues to reported being challenged by agoraphobia, recognizes need for significant relations in her life.      Group Time: 10:30-12:00  Participation Level:  Active  Behavioral Response: Appropriate  Type of Therapy: Psycho-education Group  Summary of Progress: Pt. Participated in discussion about developing self-care routine, and planning for self-care holiday party for tomorrow's group.  Nancie Neas, LPC

## 2014-04-15 ENCOUNTER — Other Ambulatory Visit (HOSPITAL_COMMUNITY): Payer: BLUE CROSS/BLUE SHIELD | Admitting: Psychiatry

## 2014-04-15 DIAGNOSIS — F3130 Bipolar disorder, current episode depressed, mild or moderate severity, unspecified: Secondary | ICD-10-CM

## 2014-04-15 DIAGNOSIS — F319 Bipolar disorder, unspecified: Secondary | ICD-10-CM | POA: Diagnosis not present

## 2014-04-15 NOTE — Progress Notes (Signed)
    Daily Group Progress Note  Program: IOP  Group Time: 9:00-10:30  Participation Level: Active  Behavioral Response: Appropriate  Type of Therapy:  Individual Therapy  Summary of Progress: Pt. Presented with bright affect. Conducted individual session because Pt. Was the only patient in group. Pt. Discussed absence of self-care, concerns about relationship, isolation from family members.     Group Time: 10:30-12:00  Participation Level:  Active  Behavioral Response: Appropriate  Type of Therapy: Individual Therapy  Summary of Progress: Pt. Continued to process relationship concerns and clarification of healthy boundaries.   Nancie Neas, LPC

## 2014-04-16 ENCOUNTER — Other Ambulatory Visit (HOSPITAL_COMMUNITY): Payer: BLUE CROSS/BLUE SHIELD | Admitting: Psychiatry

## 2014-04-16 DIAGNOSIS — F3130 Bipolar disorder, current episode depressed, mild or moderate severity, unspecified: Secondary | ICD-10-CM

## 2014-04-16 DIAGNOSIS — F319 Bipolar disorder, unspecified: Secondary | ICD-10-CM | POA: Diagnosis not present

## 2014-04-16 NOTE — Progress Notes (Signed)
    Daily Group Progress Note  Program: IOP  Group Time: 9:00-10:30  Participation Level: Active  Behavioral Response: Appropriate  Type of Therapy:  Group Therapy  Summary of Progress: Pt. Presents with bright affect, talkative, laughs appropriately. Pt. Continues to report that she has significant anxiety and is challenged to engage in consistent self-care. Pt. Discussed pain and fear of being rejected by her family if she were to ask for emotional support.      Group Time: 10:30-12:00  Participation Level:  Active  Behavioral Response: Appropriate  Type of Therapy: Psycho-education Group  Summary of Progress: Pt. Participated in discussion about development of vulnerability in supportive relationships. Pt. Watched and discussed Almond Lint video.   Nancie Neas, LPC

## 2014-04-17 ENCOUNTER — Other Ambulatory Visit (HOSPITAL_COMMUNITY): Payer: BLUE CROSS/BLUE SHIELD

## 2014-04-18 ENCOUNTER — Other Ambulatory Visit (HOSPITAL_COMMUNITY): Payer: BLUE CROSS/BLUE SHIELD | Admitting: Psychiatry

## 2014-04-18 ENCOUNTER — Telehealth (HOSPITAL_COMMUNITY): Payer: Self-pay | Admitting: Psychiatry

## 2014-04-22 ENCOUNTER — Other Ambulatory Visit (HOSPITAL_COMMUNITY): Payer: BLUE CROSS/BLUE SHIELD | Attending: Psychiatry | Admitting: Psychiatry

## 2014-04-22 DIAGNOSIS — F419 Anxiety disorder, unspecified: Secondary | ICD-10-CM | POA: Diagnosis not present

## 2014-04-22 DIAGNOSIS — F3130 Bipolar disorder, current episode depressed, mild or moderate severity, unspecified: Secondary | ICD-10-CM

## 2014-04-22 DIAGNOSIS — F329 Major depressive disorder, single episode, unspecified: Secondary | ICD-10-CM | POA: Diagnosis present

## 2014-04-22 NOTE — Progress Notes (Signed)
    Daily Group Progress Note  Program: IOP  Group Time: 9:00-10:30  Participation Level: Active  Behavioral Response: Appropriate  Type of Therapy:  Group Therapy  Summary of Progress: Pt. Appeared sleepy, lethargic. Pt. Talks appropriately when encouraged. Pt. Reports that she continues to be depressed and challenged by agoraphobia. Pt. Has some anxiety about renewing relationship with boyfriend when he is released from prison next month.      Group Time: 10:30-12:00  Participation Level:  Active  Behavioral Response: Appropriate  Type of Therapy: Psycho-education Group  Summary of Progress: Pt. Participated in grief and loss facilitated by Jeanella Craze.   Nancie Neas, LPC

## 2014-04-23 ENCOUNTER — Other Ambulatory Visit (HOSPITAL_COMMUNITY): Payer: BLUE CROSS/BLUE SHIELD | Admitting: Psychiatry

## 2014-04-24 ENCOUNTER — Other Ambulatory Visit (HOSPITAL_COMMUNITY): Payer: BLUE CROSS/BLUE SHIELD | Admitting: Psychiatry

## 2014-04-24 DIAGNOSIS — F3130 Bipolar disorder, current episode depressed, mild or moderate severity, unspecified: Secondary | ICD-10-CM

## 2014-04-24 DIAGNOSIS — F329 Major depressive disorder, single episode, unspecified: Secondary | ICD-10-CM | POA: Diagnosis not present

## 2014-04-24 DIAGNOSIS — F4310 Post-traumatic stress disorder, unspecified: Secondary | ICD-10-CM

## 2014-04-24 NOTE — Progress Notes (Signed)
    Daily Group Progress Note  Program: IOP  Group Time: 9:00-10:30  Participation Level: Active  Behavioral Response: Appropriate  Type of Therapy:  Group Therapy  Summary of Progress: Pt. Presented as lethargic, sleepy, made appropriate eye contact and talked when prompted. Pt. Discussed anxiety related to return to work and thoughts about changing careers. Pt. 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 Pt. Discussed relationships with brother, mother, and boyfriend. Pt. Discussed behavioral patterns related to previous suicidal attempts which were feelings of hopelessness, feeling alone, and belief and things were not going to get better in her life.      Group Time: 10:30-12:00  Participation Level:  Active  Behavioral Response: Appropriate  Type of Therapy: Psycho-education Group  Summary of Progress: Pt. Participated in discussion about developing healthy relationship boundaries.   [facility]OP[facility] PSYCH

## 2014-04-25 ENCOUNTER — Other Ambulatory Visit (HOSPITAL_COMMUNITY): Payer: BLUE CROSS/BLUE SHIELD | Admitting: Psychiatry

## 2014-04-25 DIAGNOSIS — F3130 Bipolar disorder, current episode depressed, mild or moderate severity, unspecified: Secondary | ICD-10-CM

## 2014-04-25 DIAGNOSIS — F329 Major depressive disorder, single episode, unspecified: Secondary | ICD-10-CM | POA: Diagnosis not present

## 2014-04-25 NOTE — Progress Notes (Signed)
    Daily Group Progress Note  Program: IOP  Group Time: 9977-4142  Participation Level: Active  Behavioral Response: Appropriate  Type of Therapy:  Group Therapy  Summary of Progress:  Patient voiced that she doesn't feel ready for discharge.  Fearful of returning to work.  Explored fear with pt.  Pt states that she is considering talking to her providers about long-term disability.  Pt not receptive to feedback.      Group Time: 1045-1200  Participation Level:  Minimal  Behavioral Response: Evasive  Type of Therapy: Psycho-education Group  Summary of Progress: Nutritionist Antonieta Iba, RD, LDN) from Nutrition & Diabetes Department came and led group.  She discussed healthy eating habits, portion control and eating without distractions (i.e. TV, electronics, etc).  Provided pt with resources and websites.

## 2014-04-25 NOTE — Progress Notes (Signed)
  [facility] Discharge Summary  Patricia Rodriguez [id number]  Admission date: 04/02/2014 Discharge date: 04/26/2014  Reason for admission: persistent anxiety and depression  Chemical Use History: none  Family of Origin Issues: none  Progress in Program Toward Treatment Goals: says she believes she is about " half way there",  Does not want to go back to the stress of work, recognizes she is going to have to take more chances in life if she is to get better  Progress (rationale): talking and sharing in group helps but does not get to the core of her anxiety which is likely to get more help from individual therapy.    [name], MD 04/25/2014

## 2014-04-26 ENCOUNTER — Other Ambulatory Visit (HOSPITAL_COMMUNITY): Payer: BLUE CROSS/BLUE SHIELD | Admitting: Psychiatry

## 2014-04-26 DIAGNOSIS — F331 Major depressive disorder, recurrent, moderate: Secondary | ICD-10-CM

## 2014-04-29 ENCOUNTER — Other Ambulatory Visit (HOSPITAL_COMMUNITY): Payer: BLUE CROSS/BLUE SHIELD
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
## 2014-04-29 NOTE — Patient Instructions (Signed)
Patient didn't return to MH-IOP in order to complete her discharge.  Will follow up with [name], [facility] and [name], NP.  Encouraged support groups.  Return to work as stated by [name], NP or [name], [place].

## 2014-04-30 ENCOUNTER — Encounter (HOSPITAL_COMMUNITY): Payer: Self-pay | Admitting: Psychiatry

## 2014-04-30 ENCOUNTER — Other Ambulatory Visit (HOSPITAL_COMMUNITY): Payer: BLUE CROSS/BLUE SHIELD

## 2014-05-01 ENCOUNTER — Other Ambulatory Visit (HOSPITAL_COMMUNITY): Payer: BLUE CROSS/BLUE SHIELD

## 2014-05-02 ENCOUNTER — Other Ambulatory Visit (HOSPITAL_COMMUNITY): Payer: BLUE CROSS/BLUE SHIELD

## 2014-05-03 ENCOUNTER — Other Ambulatory Visit (HOSPITAL_COMMUNITY): Payer: BLUE CROSS/BLUE SHIELD

## 2014-05-06 ENCOUNTER — Other Ambulatory Visit (HOSPITAL_COMMUNITY): Payer: BLUE CROSS/BLUE SHIELD

## 2014-05-07 ENCOUNTER — Other Ambulatory Visit (HOSPITAL_COMMUNITY): Payer: BLUE CROSS/BLUE SHIELD

## 2014-05-08 ENCOUNTER — Other Ambulatory Visit (HOSPITAL_COMMUNITY): Payer: BLUE CROSS/BLUE SHIELD

## 2014-05-09 ENCOUNTER — Other Ambulatory Visit (HOSPITAL_COMMUNITY): Payer: BLUE CROSS/BLUE SHIELD

## 2014-05-10 ENCOUNTER — Other Ambulatory Visit (HOSPITAL_COMMUNITY): Payer: BLUE CROSS/BLUE SHIELD

## 2014-05-13 ENCOUNTER — Other Ambulatory Visit (HOSPITAL_COMMUNITY): Payer: BLUE CROSS/BLUE SHIELD

## 2014-05-14 ENCOUNTER — Other Ambulatory Visit (HOSPITAL_COMMUNITY): Payer: BLUE CROSS/BLUE SHIELD

## 2014-07-01 ENCOUNTER — Emergency Department (HOSPITAL_COMMUNITY): Payer: BLUE CROSS/BLUE SHIELD

## 2014-07-01 ENCOUNTER — Encounter (HOSPITAL_COMMUNITY): Payer: Self-pay

## 2014-07-01 ENCOUNTER — Inpatient Hospital Stay (HOSPITAL_COMMUNITY)
Admission: EM | Admit: 2014-07-01 | Discharge: 2014-07-03 | DRG: 918 | Payer: BLUE CROSS/BLUE SHIELD | Attending: Internal Medicine | Admitting: Internal Medicine

## 2014-07-01 DIAGNOSIS — E876 Hypokalemia: Secondary | ICD-10-CM | POA: Diagnosis not present

## 2014-07-01 DIAGNOSIS — R531 Weakness: Secondary | ICD-10-CM | POA: Diagnosis not present

## 2014-07-01 DIAGNOSIS — R51 Headache: Secondary | ICD-10-CM | POA: Diagnosis present

## 2014-07-01 DIAGNOSIS — N390 Urinary tract infection, site not specified: Secondary | ICD-10-CM | POA: Diagnosis present

## 2014-07-01 DIAGNOSIS — T424X1A Poisoning by benzodiazepines, accidental (unintentional), initial encounter: Secondary | ICD-10-CM | POA: Diagnosis present

## 2014-07-01 DIAGNOSIS — F314 Bipolar disorder, current episode depressed, severe, without psychotic features: Secondary | ICD-10-CM | POA: Diagnosis not present

## 2014-07-01 DIAGNOSIS — Z888 Allergy status to other drugs, medicaments and biological substances status: Secondary | ICD-10-CM | POA: Diagnosis not present

## 2014-07-01 DIAGNOSIS — F419 Anxiety disorder, unspecified: Secondary | ICD-10-CM | POA: Diagnosis present

## 2014-07-01 DIAGNOSIS — R45851 Suicidal ideations: Secondary | ICD-10-CM | POA: Diagnosis not present

## 2014-07-01 DIAGNOSIS — T50902A Poisoning by unspecified drugs, medicaments and biological substances, intentional self-harm, initial encounter: Secondary | ICD-10-CM

## 2014-07-01 DIAGNOSIS — T424X2A Poisoning by benzodiazepines, intentional self-harm, initial encounter: Principal | ICD-10-CM | POA: Diagnosis present

## 2014-07-01 DIAGNOSIS — F319 Bipolar disorder, unspecified: Secondary | ICD-10-CM | POA: Diagnosis present

## 2014-07-01 LAB — CBC WITH DIFFERENTIAL/PLATELET
BASOS ABS: 0 10*3/uL (ref 0.0–0.1)
BASOS PCT: 0 % (ref 0–1)
EOS PCT: 2 % (ref 0–5)
Eosinophils Absolute: 0.1 10*3/uL (ref 0.0–0.7)
HCT: 41.8 % (ref 36.0–46.0)
HEMOGLOBIN: 12.7 g/dL (ref 12.0–15.0)
LYMPHS PCT: 26 % (ref 12–46)
Lymphs Abs: 1.9 10*3/uL (ref 0.7–4.0)
MCH: 26.1 pg (ref 26.0–34.0)
MCHC: 30.4 g/dL (ref 30.0–36.0)
MCV: 86 fL (ref 78.0–100.0)
Monocytes Absolute: 0.5 10*3/uL (ref 0.1–1.0)
Monocytes Relative: 6 % (ref 3–12)
NEUTROS ABS: 5 10*3/uL (ref 1.7–7.7)
NEUTROS PCT: 66 % (ref 43–77)
PLATELETS: 406 10*3/uL — AB (ref 150–400)
RBC: 4.86 MIL/uL (ref 3.87–5.11)
RDW: 14.6 % (ref 11.5–15.5)
WBC: 7.5 10*3/uL (ref 4.0–10.5)

## 2014-07-01 LAB — URINE MICROSCOPIC-ADD ON

## 2014-07-01 LAB — COMPREHENSIVE METABOLIC PANEL
ALT: 19 U/L (ref 0–35)
ANION GAP: 8 (ref 5–15)
AST: 21 U/L (ref 0–37)
Albumin: 3.5 g/dL (ref 3.5–5.2)
Alkaline Phosphatase: 59 U/L (ref 39–117)
BILIRUBIN TOTAL: 0.6 mg/dL (ref 0.3–1.2)
BUN: 7 mg/dL (ref 6–23)
CO2: 31 mmol/L (ref 19–32)
Calcium: 8.4 mg/dL (ref 8.4–10.5)
Chloride: 101 mmol/L (ref 96–112)
Creatinine, Ser: 0.96 mg/dL (ref 0.50–1.10)
GFR, EST AFRICAN AMERICAN: 80 mL/min — AB (ref >90)
GFR, EST NON AFRICAN AMERICAN: 69 mL/min — AB (ref >90)
GLUCOSE: 121 mg/dL — AB (ref 70–99)
POTASSIUM: 4.2 mmol/L (ref 3.5–5.1)
Sodium: 140 mmol/L (ref 135–145)
TOTAL PROTEIN: 7.4 g/dL (ref 6.0–8.3)

## 2014-07-01 LAB — URINALYSIS, ROUTINE W REFLEX MICROSCOPIC
Bilirubin Urine: NEGATIVE
Glucose, UA: NEGATIVE mg/dL
Hgb urine dipstick: NEGATIVE
Ketones, ur: NEGATIVE mg/dL
NITRITE: NEGATIVE
PH: 7 (ref 5.0–8.0)
Protein, ur: NEGATIVE mg/dL
Specific Gravity, Urine: 1.004 — ABNORMAL LOW (ref 1.005–1.030)
Urobilinogen, UA: 0.2 mg/dL (ref 0.0–1.0)

## 2014-07-01 LAB — PREGNANCY, URINE: Preg Test, Ur: NEGATIVE

## 2014-07-01 LAB — SALICYLATE LEVEL

## 2014-07-01 LAB — RAPID URINE DRUG SCREEN, HOSP PERFORMED
Amphetamines: NOT DETECTED
BARBITURATES: NOT DETECTED
Benzodiazepines: POSITIVE — AB
Cocaine: NOT DETECTED
Opiates: NOT DETECTED
Tetrahydrocannabinol: NOT DETECTED

## 2014-07-01 LAB — ACETAMINOPHEN LEVEL: Acetaminophen (Tylenol), Serum: <10 ug/mL — ABNORMAL LOW (ref 10–30)

## 2014-07-01 MED ORDER — DICYCLOMINE HCL 20 MG PO TABS
20.0000 mg | ORAL_TABLET | Freq: Four times a day (QID) | ORAL | Status: DC | PRN
  Filled 2014-07-01: qty 1

## 2014-07-01 MED ORDER — ONDANSETRON HCL 4 MG/2ML IJ SOLN: 4.0000 mg | Freq: Four times a day (QID) | INTRAMUSCULAR | Status: DC | PRN

## 2014-07-01 MED ORDER — PANTOPRAZOLE SODIUM 40 MG PO TBEC
40.0000 mg | DELAYED_RELEASE_TABLET | Freq: Every day | ORAL | Status: DC
  Administered 2014-07-02 – 2014-07-03 (×2): 40 mg via ORAL
  Filled 2014-07-01 (×2): qty 1

## 2014-07-01 MED ORDER — ZOLPIDEM TARTRATE 5 MG PO TABS: 5.0000 mg | ORAL_TABLET | Freq: Every evening | ORAL | Status: DC | PRN

## 2014-07-01 MED ORDER — ENOXAPARIN SODIUM 40 MG/0.4ML ~~LOC~~ SOLN
40.0000 mg | SUBCUTANEOUS | Status: DC
  Administered 2014-07-01 – 2014-07-02 (×2): 40 mg via SUBCUTANEOUS
  Filled 2014-07-01 (×3): qty 0.4

## 2014-07-01 MED ORDER — ALUM & MAG HYDROXIDE-SIMETH 200-200-20 MG/5ML PO SUSP: 30.0000 mL | Freq: Four times a day (QID) | ORAL | Status: DC | PRN

## 2014-07-01 MED ORDER — BUPROPION HCL ER (XL) 150 MG PO TB24
150.0000 mg | ORAL_TABLET | Freq: Every day | ORAL | Status: DC
  Administered 2014-07-01 – 2014-07-03 (×3): 150 mg via ORAL
  Filled 2014-07-01 (×3): qty 1

## 2014-07-01 MED ORDER — IPRATROPIUM-ALBUTEROL 0.5-2.5 (3) MG/3ML IN SOLN
3.0000 mL | Freq: Once | RESPIRATORY_TRACT | Status: AC
  Administered 2014-07-02: 3 mL via RESPIRATORY_TRACT
  Filled 2014-07-01: qty 3

## 2014-07-01 MED ORDER — METRONIDAZOLE 500 MG PO TABS
2000.0000 mg | ORAL_TABLET | Freq: Once | ORAL | Status: AC
Start: 2014-07-01 — End: 2014-07-01
  Administered 2014-07-01: 2000 mg via ORAL
  Filled 2014-07-01: qty 4

## 2014-07-01 MED ORDER — ACETAMINOPHEN 325 MG PO TABS: 650.0000 mg | ORAL_TABLET | Freq: Four times a day (QID) | ORAL | Status: DC | PRN

## 2014-07-01 MED ORDER — FLUOXETINE HCL 20 MG PO CAPS
60.0000 mg | ORAL_CAPSULE | Freq: Every day | ORAL | Status: DC
  Administered 2014-07-01 – 2014-07-03 (×3): 60 mg via ORAL
  Filled 2014-07-01 (×3): qty 3

## 2014-07-01 MED ORDER — SODIUM CHLORIDE 0.9 % IV BOLUS (SEPSIS)
1000.0000 mL | Freq: Once | INTRAVENOUS | Status: AC
  Administered 2014-07-01: 1000 mL via INTRAVENOUS

## 2014-07-01 MED ORDER — ACETAMINOPHEN 650 MG RE SUPP: 650.0000 mg | Freq: Four times a day (QID) | RECTAL | Status: DC | PRN

## 2014-07-01 MED ORDER — ONDANSETRON HCL 4 MG PO TABS: 4.0000 mg | ORAL_TABLET | Freq: Four times a day (QID) | ORAL | Status: DC | PRN

## 2014-07-01 MED ORDER — CEFTRIAXONE SODIUM IN DEXTROSE 20 MG/ML IV SOLN
1.0000 g | INTRAVENOUS | Status: DC
Start: 2014-07-01 — End: 2014-07-03
  Administered 2014-07-01 – 2014-07-02 (×2): 1 g via INTRAVENOUS
  Filled 2014-07-01 (×3): qty 50

## 2014-07-01 MED ORDER — BUTALBITAL-APAP-CAFFEINE 50-325-40 MG PO TABS: 1.0000 | ORAL_TABLET | ORAL | Status: DC | PRN

## 2014-07-01 NOTE — H&P (Addendum)
Patient Demographics  Patricia Rodriguez, is a 49 y.o. female  MRN: [id number]   DOB - 1965/10/05  Admit Date - 07/01/2014  Outpatient Primary MD for the patient is Maggie Font, MD   [place] is a pleasant 50 year old female with bipolar disorder, history of suicidal attempts 2 prior to now and morbid obesity, who comes in after she took about 50 tablets of 1 mg strength Xanax around 4:00 this morning per her on account. She apparently texted a friend and her daughter saying goodbye. She has been referred to the medical service as she remains weak. The weakness may be related to UTI, otherwise her labs are within normal limits and she is oxygenating adequately on room air. Apparently she could not walk in the emergency department hence concern that she may need management on the medical side before referral to psychiatry. She is otherwise lucid and responding to questions appropriately. Will start IV antibiotics, monitor overnight and consult psychiatry in a.m. She will have bedside sitter in the meanwhile. Will defer management of bipolar disorder to psychiatry. Of note is that patient stopped taking her regular psych medications about 3 weeks ago. Plan Overdose of benzodiazepine/bipolar disorder/Suicidal ideation  Admit MedSurg  Bedside sitter.  Consult psychiatry in a.m.  Resume some of patient's regular home medications but hold Xanax  Check TSH. UTI  Follow urine culture  Ceftriaxone IV Obesity, morbid  No acute changes DVT/GI Prophylaxis  Lovenox Family Communication: Admission, patients condition and plan of care including tests being ordered have been discussed with the patient and family including mother and daughter who indicate understanding and agree with the plan and Code Status.  Code Status   Full  Likely DC  To inpatient Psychiatry  Condition GUARDED    Time spent in minutes : 65  Chief Complaint Chief Complaint  Patient  presents with  . Ingestion  . Suicidal     HPI Patricia Rodriguez  is a 49 y.o. female who comes in after she to Approximately 50 tablets of 1 mg strength is Xanax at around 4 AM. She texted her daughter and a friend, apparently to tell them goodbye. A friend found her around 9 AM and called 911. She says she felt like giving up as she is not getting attention. She stopped taking a regular bipolar medications about 3 weeks ago. She says she was ready to give up. She denies any nausea vomiting diarrhea or dysuria or cough.   Review of Systems   As in the HPI above.   Past Medical History  Diagnosis Date  . Mental disorder   . Anxiety   . Depression   . IBS (irritable bowel syndrome)   . Headache(784.0)       History reviewed. No pertinent past surgical history.  Social History History  Substance Use Topics  . Smoking status: Never Smoker   . Smokeless tobacco: Not on file  . Alcohol Use: No    Family History Family History  Problem Relation Age of Onset  . Depression Maternal Aunt   . Alcohol abuse Maternal Aunt   . Drug abuse Maternal Aunt   . Drug abuse Father   . Alcohol abuse Paternal Aunt   . Drug abuse Paternal Aunt   . Alcohol abuse Maternal Uncle   . Drug abuse Maternal Uncle   . Alcohol abuse Paternal Uncle   . Drug abuse Paternal Uncle     Prior to Admission medications   Medication  Sig Start Date End Date Taking? Authorizing Provider  ALPRAZolam Duanne Moron) 1 MG tablet Take 1 tablet (1 mg total) by mouth 3 (three) times daily as needed. 07/17/13  Yes Leonides Grills, MD  aspirin-acetaminophen-caffeine (EXCEDRIN MIGRAINE) [phone] MG per tablet Take 2 tablets by mouth every 6 (six) hours as needed for headache or migraine.   Yes Historical Provider, MD  Aspirin-Salicylamide-Caffeine (BC HEADACHE POWDER PO) Take 1 each by mouth 3 (three) times daily as needed (headache).   Yes Historical Provider, MD  butalbital-acetaminophen-caffeine (FIORICET, ESGIC)  50-325-40 MG per tablet Take 1 tablet by mouth every 4 (four) hours as needed. headache 01/09/14  Yes Historical Provider, MD  dicyclomine (BENTYL) 20 MG tablet Take 20 mg by mouth every 6 (six) hours as needed (for irritable bowel syndrome).    Yes Historical Provider, MD  FLUoxetine HCl 60 MG TABS Take 60 mg by mouth daily. 12/06/13  Yes Historical Provider, MD  lithium carbonate 300 MG capsule Take 600 mg by mouth 2 (two) times daily with a meal.   Yes Historical Provider, MD  lurasidone (LATUDA) 40 MG TABS tablet Take 40 mg by mouth daily.    Yes Historical Provider, MD  pantoprazole (PROTONIX) 20 MG tablet Take 20 mg by mouth.   Yes Historical Provider, MD  buPROPion (WELLBUTRIN XL) 150 MG 24 hr tablet Take 150 mg by mouth daily.     Historical Provider, MD  ibuprofen (ADVIL,MOTRIN) 600 MG tablet Take 1 tablet (600 mg total) by mouth every 6 (six) hours as needed. Patient not taking: Reported on 07/01/2014 01/08/14   Julianne Rice, MD    Allergies  Allergen Reactions  . Vicodin [Hydrocodone-Acetaminophen] Hives and Itching    Physical Exam  Vitals  Blood pressure 134/72, pulse 99, temperature 98.5 F (36.9 C), temperature source Oral, resp. rate 16, height 5' (1.524 m), SpO2 99 %.   1. General: lying in bed in NAD.  2. Normal affect and insight, Not Suicidal or Homicidal, Awake Alert, Oriented X 3.  3. No F.N deficits, ALL C.Nerves Intact, Strength 5/5 all 4 extremities, Sensation intact all 4 extremities, Plantars down going.  4. Ears and Eyes appear Normal, Conjunctivae clear, PERRLA. Moist Oral Mucosa.  5. Supple Neck, No JVD, No cervical lymphadenopathy appriciated, No Carotid Bruits.  6. Symmetrical Chest wall movement, Good air movement bilaterally, CTAB.  7. RRR, No Gallops, Rubs or Murmurs, No Parasternal Heave.  8. Positive Bowel Sounds, Abdomen Soft, Non tender, No organomegaly appriciated,No rebound -guarding or rigidity.  9.  No Cyanosis, Normal Skin Turgor, No  Skin Rash or Bruise.  10. Good muscle tone,  joints appear normal , no effusions, Normal ROM.  11. No Palpable Lymph Nodes in Neck or Axillae  Data Review CBC  Recent Labs Lab 07/01/14 1240  WBC 7.5  HGB 12.7  HCT 41.8  PLT 406*  MCV 86.0  MCH 26.1  MCHC 30.4  RDW 14.6  LYMPHSABS 1.9  MONOABS 0.5  EOSABS 0.1  BASOSABS 0.0   ------------------------------------------------------------------------------------------------------------------  Chemistries   Recent Labs Lab 07/01/14 1240  NA 140  K 4.2  CL 101  CO2 31  GLUCOSE 121*  BUN 7  CREATININE 0.96  CALCIUM 8.4  AST 21  ALT 19  ALKPHOS 59  BILITOT 0.6   ------------------------------------------------------------------------------------------------------------------ CrCl cannot be calculated (Unknown ideal weight.). ------------------------------------------------------------------------------------------------------------------ No results for input(s): TSH, T4TOTAL, T3FREE, THYROIDAB in the last 72 hours.  Invalid input(s): FREET3   Coagulation profile No results for input(s): INR, PROTIME  in the last 168 hours. ------------------------------------------------------------------------------------------------------------------- No results for input(s): DDIMER in the last 72 hours. -------------------------------------------------------------------------------------------------------------------  Cardiac Enzymes No results for input(s): CKMB, TROPONINI, MYOGLOBIN in the last 168 hours.  Invalid input(s): CK ------------------------------------------------------------------------------------------------------------------ Invalid input(s): POCBNP   ---------------------------------------------------------------------------------------------------------------  Urinalysis    Component Value Date/Time   COLORURINE YELLOW 07/01/2014 1238   APPEARANCEUR CLEAR 07/01/2014 1238   LABSPEC 1.004* 07/01/2014  1238   PHURINE 7.0 07/01/2014 1238   GLUCOSEU NEGATIVE 07/01/2014 1238   HGBUR NEGATIVE 07/01/2014 1238   BILIRUBINUR NEGATIVE 07/01/2014 1238   KETONESUR NEGATIVE 07/01/2014 1238   PROTEINUR NEGATIVE 07/01/2014 1238   UROBILINOGEN 0.2 07/01/2014 1238   NITRITE NEGATIVE 07/01/2014 1238   LEUKOCYTESUR MODERATE* 07/01/2014 1238    ----------------------------------------------------------------------------------------------------------------  Imaging results  Ct Head Wo Contrast  07/01/2014   CLINICAL DATA:  intentional ingestion of 50-60 1mg  Xanax tablets around 0245 and suicidal w/ plan to OD. Denies pain. Denies HI/AV. Hx of previous suicide attempts. A & Ox4. Hx of anxiety and depression  EXAM: CT HEAD WITHOUT CONTRAST  TECHNIQUE: Contiguous axial images were obtained from the base of the skull through the vertex without intravenous contrast.  COMPARISON:  07/18/ 2005  FINDINGS: There is no evidence of acute intracranial hemorrhage, brain edema, mass lesion, acute infarction, mass effect, or midline shift. Acute infarct may be inapparent on noncontrast CT. No other intra-axial abnormalities are seen, and the ventricles and sulci are within normal limits in size and symmetry. No abnormal extra-axial fluid collections or masses are identified. No significant calvarial abnormality.  IMPRESSION: 1. Negative for bleed or other acute intracranial process.   Electronically Signed   By: Lucrezia Europe M.D.   On: 07/01/2014 13:18   Dg Chest Port 1 View  07/01/2014   CLINICAL DATA:  Drug ingestion.  EXAM: PORTABLE CHEST - 1 VIEW  COMPARISON:  None.  FINDINGS: Lung volumes are low bilaterally. There is elevation of the left hemidiaphragm. There is no evidence of pulmonary edema, consolidation, pneumothorax, nodule or pleural fluid. The heart size and mediastinal contours are within normal limits. Visualized bony structures are unremarkable.  IMPRESSION: No active disease.   Electronically Signed   By: Aletta Edouard M.D.   On: 07/01/2014 12:58        Reginae Wolfrey M.D on 07/01/2014 at 7:50 PM  Between 7am to 7pm - Pager - [phone]  After 7pm go to [email or web address] - password [email or web address]  And look for the night coverage person covering me after hours  [facility] Office  [phone]

## 2014-07-01 NOTE — ED Notes (Signed)
MD ZAMMIT at bedside.

## 2014-07-01 NOTE — ED Notes (Signed)
Per EMS, Pt, from home, c/o intentional ingestion of 50-60 1mg  Xanax tablets around 0245 and suicidal w/ plan to OD.  Denies pain.  Denies HI/AV.  Hx of previous suicide attempts.  A & Ox4.  Hx of anxiety and depression.

## 2014-07-01 NOTE — ED Notes (Signed)
Bed: BT24 Expected date:  Expected time:  Means of arrival:  Comments: EMS- OD at [place]

## 2014-07-01 NOTE — ED Notes (Signed)
Crackers and water given.

## 2014-07-01 NOTE — ED Provider Notes (Signed)
CSN: [id number]     Arrival date & time 07/01/14  1151 History   First MD Initiated Contact with Patient 07/01/14 1204     Chief Complaint  Patient presents with  . Ingestion  . Suicidal     (Consider location/radiation/quality/duration/timing/severity/associated sxs/prior Treatment) Patient is a 49 y.o. female presenting with Ingested Medication. The history is provided by the patient (the pt states she took 40 xanax at aroung 230am today to hurt herself).  Ingestion This is a new problem. The current episode started 12 to 24 hours ago. The problem occurs constantly. The problem has not changed since onset.Pertinent negatives include no chest pain, no abdominal pain and no headaches. Nothing aggravates the symptoms. Nothing relieves the symptoms.    Past Medical History  Diagnosis Date  . Mental disorder   . Anxiety   . Depression   . IBS (irritable bowel syndrome)   . Headache(784.0)    History reviewed. No pertinent past surgical history. Family History  Problem Relation Age of Onset  . Depression Maternal Aunt   . Alcohol abuse Maternal Aunt   . Drug abuse Maternal Aunt   . Drug abuse Father   . Alcohol abuse Paternal Aunt   . Drug abuse Paternal Aunt   . Alcohol abuse Maternal Uncle   . Drug abuse Maternal Uncle   . Alcohol abuse Paternal Uncle   . Drug abuse Paternal Uncle    History  Substance Use Topics  . Smoking status: Never Smoker   . Smokeless tobacco: Not on file  . Alcohol Use: No   OB History    No data available     Review of Systems  Constitutional: Negative for appetite change and fatigue.  HENT: Negative for congestion, ear discharge and sinus pressure.   Eyes: Negative for discharge.  Respiratory: Negative for cough.   Cardiovascular: Negative for chest pain.  Gastrointestinal: Negative for abdominal pain and diarrhea.  Genitourinary: Negative for frequency and hematuria.  Musculoskeletal: Negative for back pain.  Skin: Negative for rash.   Neurological: Negative for seizures and headaches.  Psychiatric/Behavioral: Positive for dysphoric mood. Negative for hallucinations.      Allergies  Vicodin  Home Medications   Prior to Admission medications   Medication Sig Start Date End Date Taking? Authorizing Provider  ALPRAZolam Duanne Moron) 1 MG tablet Take 1 tablet (1 mg total) by mouth 3 (three) times daily as needed. 07/17/13  Yes Leonides Grills, MD  aspirin-acetaminophen-caffeine (EXCEDRIN MIGRAINE) [phone] MG per tablet Take 2 tablets by mouth every 6 (six) hours as needed for headache or migraine.   Yes Historical Provider, MD  Aspirin-Salicylamide-Caffeine (BC HEADACHE POWDER PO) Take 1 each by mouth 3 (three) times daily as needed (headache).   Yes Historical Provider, MD  butalbital-acetaminophen-caffeine (FIORICET, ESGIC) 50-325-40 MG per tablet Take 1 tablet by mouth every 4 (four) hours as needed. headache 01/09/14  Yes Historical Provider, MD  dicyclomine (BENTYL) 20 MG tablet Take 20 mg by mouth every 6 (six) hours as needed (for irritable bowel syndrome).    Yes Historical Provider, MD  FLUoxetine HCl 60 MG TABS Take 60 mg by mouth daily. 12/06/13  Yes Historical Provider, MD  lithium carbonate 300 MG capsule Take 600 mg by mouth 2 (two) times daily with a meal.   Yes Historical Provider, MD  lurasidone (LATUDA) 40 MG TABS tablet Take 40 mg by mouth daily.    Yes Historical Provider, MD  pantoprazole (PROTONIX) 20 MG tablet Take 20 mg by mouth.  Yes Historical Provider, MD  buPROPion (WELLBUTRIN XL) 150 MG 24 hr tablet Take 150 mg by mouth daily.     Historical Provider, MD  ibuprofen (ADVIL,MOTRIN) 600 MG tablet Take 1 tablet (600 mg total) by mouth every 6 (six) hours as needed. Patient not taking: Reported on 07/01/2014 01/08/14   Julianne Rice, MD   BP 144/82 mmHg  Pulse 90  Temp(Src) 98.4 F (36.9 C) (Oral)  Resp 26  SpO2 96% Physical Exam  Constitutional: She is oriented to person, place, and time.  She appears well-developed.  HENT:  Head: Normocephalic.  Eyes: Conjunctivae and EOM are normal. No scleral icterus.  Neck: Neck supple. No thyromegaly present.  Cardiovascular: Normal rate and regular rhythm.  Exam reveals no gallop and no friction rub.   No murmur heard. Pulmonary/Chest: No stridor. She has no wheezes. She has no rales. She exhibits no tenderness.  Abdominal: She exhibits no distension. There is no tenderness. There is no rebound.  Musculoskeletal: Normal range of motion. She exhibits no edema.  Lymphadenopathy:    She has no cervical adenopathy.  Neurological: She is oriented to person, place, and time. She exhibits normal muscle tone. Coordination normal.  Pt moderately lethargic and is ataxic  Skin: No rash noted. No erythema.  Psychiatric: She has a normal mood and affect. Her behavior is normal.    ED Course  Procedures (including critical care time) Labs Review Labs Reviewed  CBC WITH DIFFERENTIAL/PLATELET - Abnormal; Notable for the following:    Platelets 406 (*)    All other components within normal limits  COMPREHENSIVE METABOLIC PANEL - Abnormal; Notable for the following:    Glucose, Bld 121 (*)    GFR calc non Af Amer 69 (*)    GFR calc Af Amer 80 (*)    All other components within normal limits  ACETAMINOPHEN LEVEL - Abnormal; Notable for the following:    Acetaminophen (Tylenol), Serum <10.0 (*)    All other components within normal limits  URINALYSIS, ROUTINE W REFLEX MICROSCOPIC - Abnormal; Notable for the following:    Specific Gravity, Urine 1.004 (*)    Leukocytes, UA MODERATE (*)    All other components within normal limits  URINE RAPID DRUG SCREEN (HOSP PERFORMED) - Abnormal; Notable for the following:    Benzodiazepines POSITIVE (*)    All other components within normal limits  URINE MICROSCOPIC-ADD ON - Abnormal; Notable for the following:    Bacteria, UA FEW (*)    All other components within normal limits  SALICYLATE LEVEL   PREGNANCY, URINE    Imaging Review Ct Head Wo Contrast  07/01/2014   CLINICAL DATA:  intentional ingestion of 50-60 1mg  Xanax tablets around 0245 and suicidal w/ plan to OD. Denies pain. Denies HI/AV. Hx of previous suicide attempts. A & Ox4. Hx of anxiety and depression  EXAM: CT HEAD WITHOUT CONTRAST  TECHNIQUE: Contiguous axial images were obtained from the base of the skull through the vertex without intravenous contrast.  COMPARISON:  07/18/ 2005  FINDINGS: There is no evidence of acute intracranial hemorrhage, brain edema, mass lesion, acute infarction, mass effect, or midline shift. Acute infarct may be inapparent on noncontrast CT. No other intra-axial abnormalities are seen, and the ventricles and sulci are within normal limits in size and symmetry. No abnormal extra-axial fluid collections or masses are identified. No significant calvarial abnormality.  IMPRESSION: 1. Negative for bleed or other acute intracranial process.   Electronically Signed   By: Eden Emms.D.  On: 07/01/2014 13:18   Dg Chest Port 1 View  07/01/2014   CLINICAL DATA:  Drug ingestion.  EXAM: PORTABLE CHEST - 1 VIEW  COMPARISON:  None.  FINDINGS: Lung volumes are low bilaterally. There is elevation of the left hemidiaphragm. There is no evidence of pulmonary edema, consolidation, pneumothorax, nodule or pleural fluid. The heart size and mediastinal contours are within normal limits. Visualized bony structures are unremarkable.  IMPRESSION: No active disease.   Electronically Signed   By: Aletta Edouard M.D.   On: 07/01/2014 12:58     EKG Interpretation   Date/Time:  Monday July 01 2014 12:33:00 EDT Ventricular Rate:  96 PR Interval:  145 QRS Duration: 87 QT Interval:  354 QTC Calculation: 447 R Axis:   21 Text Interpretation:  Sinus rhythm Abnormal R-wave progression, early  transition Left ventricular hypertrophy Borderline T abnormalities,  inferior leads Minimal ST elevation, inferior leads Confirmed by  Dominiq Fontaine   MD, Hodaya Curto [phone]) on 07/01/2014 2:36:10 PM     CRITICAL CARE Performed by: Coston Mandato L Total critical care time: 40 Critical care time was exclusive of separately billable procedures and treating other patients. Critical care was necessary to treat or prevent imminent or life-threatening deterioration. Critical care was time spent personally by me on the following activities: development of treatment plan with patient and/or surrogate as well as nursing, discussions with consultants, evaluation of patient's response to treatment, examination of patient, obtaining history from patient or surrogate, ordering and performing treatments and interventions, ordering and review of laboratory studies, ordering and review of radiographic studies, pulse oximetry and re-evaluation of patient's condition.   MDM   Final diagnoses:  Weakness  Overdose, intentional self-harm, initial encounter    admit    Milton Ferguson, MD 07/01/14 1436

## 2014-07-02 DIAGNOSIS — R45851 Suicidal ideations: Secondary | ICD-10-CM

## 2014-07-02 DIAGNOSIS — E876 Hypokalemia: Secondary | ICD-10-CM | POA: Diagnosis not present

## 2014-07-02 DIAGNOSIS — F314 Bipolar disorder, current episode depressed, severe, without psychotic features: Secondary | ICD-10-CM

## 2014-07-02 LAB — COMPREHENSIVE METABOLIC PANEL
ALT: 16 U/L (ref 0–35)
AST: 16 U/L (ref 0–37)
Albumin: 3.3 g/dL — ABNORMAL LOW (ref 3.5–5.2)
Alkaline Phosphatase: 57 U/L (ref 39–117)
Anion gap: 6 (ref 5–15)
BILIRUBIN TOTAL: 0.2 mg/dL — AB (ref 0.3–1.2)
BUN: 10 mg/dL (ref 6–23)
CO2: 32 mmol/L (ref 19–32)
Calcium: 8.3 mg/dL — ABNORMAL LOW (ref 8.4–10.5)
Chloride: 101 mmol/L (ref 96–112)
Creatinine, Ser: 1.06 mg/dL (ref 0.50–1.10)
GFR calc Af Amer: 71 mL/min — ABNORMAL LOW (ref >90)
GFR, EST NON AFRICAN AMERICAN: 62 mL/min — AB (ref >90)
Glucose, Bld: 184 mg/dL — ABNORMAL HIGH (ref 70–99)
Potassium: 3.1 mmol/L — ABNORMAL LOW (ref 3.5–5.1)
SODIUM: 139 mmol/L (ref 135–145)
Total Protein: 6.9 g/dL (ref 6.0–8.3)

## 2014-07-02 LAB — CBC
HEMATOCRIT: 36.8 % (ref 36.0–46.0)
Hemoglobin: 11.5 g/dL — ABNORMAL LOW (ref 12.0–15.0)
MCH: 26.9 pg (ref 26.0–34.0)
MCHC: 31.3 g/dL (ref 30.0–36.0)
MCV: 86.2 fL (ref 78.0–100.0)
PLATELETS: 399 10*3/uL (ref 150–400)
RBC: 4.27 MIL/uL (ref 3.87–5.11)
RDW: 14.7 % (ref 11.5–15.5)
WBC: 7.8 10*3/uL (ref 4.0–10.5)

## 2014-07-02 LAB — LITHIUM LEVEL: Lithium Lvl: <0.8 mmol/L — ABNORMAL LOW (ref 0.80–1.40)

## 2014-07-02 LAB — TSH: TSH: 1.938 u[IU]/mL (ref 0.350–4.500)

## 2014-07-02 MED ORDER — POTASSIUM CHLORIDE 20 MEQ/15ML (10%) PO SOLN
40.0000 meq | Freq: Once | ORAL | Status: AC
  Administered 2014-07-02: 40 meq via ORAL
  Filled 2014-07-02: qty 30

## 2014-07-02 MED ORDER — POTASSIUM CHLORIDE 10 MEQ/100ML IV SOLN: 10.0000 meq | INTRAVENOUS | Status: DC

## 2014-07-02 NOTE — Consult Note (Signed)
[place] Psychiatry Consult   Reason for Consult:  Bipolar disorder and xanax overdose Referring Physician:  Dr. Sanjuana Letters Patient Identification: Patricia Rodriguez MRN:  [id number] Principal Diagnosis: Bipolar disorder Diagnosis:   Patient Active Problem List   Diagnosis Date Noted  . Overdose of benzodiazepine [T42.4X1A] 07/01/2014  . Bipolar disorder [F31.9] 07/01/2014  . Suicidal ideation [R45.851] 07/01/2014  . UTI (urinary tract infection) [N39.0] 07/01/2014  . ONYCHOMYCOSIS [B35.1] 06/16/2006  . Obesity, morbid [E66.01] 06/16/2006  . 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 ECZEMA, ATOPIC DERMATITIS [L20.9] 06/16/2006    Total Time spent with patient: 45 minutes  Subjective:   Patricia Rodriguez is a 49 y.o. female patient admitted with alprazolam overdose with suicide intent.  HPI:  Patricia Rodriguez is a 49 years old female seen and chart reviewed for psychiatric consultation and evaluation of depression with suicidal attempt by intentional overdose of alprazolam. Patient mother is at bedside during this evaluation and reportedly concerned about her safety secondary to previous suicidal attempts and exacerbated symptoms of depression and anxiety. Patient reportedly to approximately 40-50 mg of alprazolam and then communicated with the 2 or 3 people including her ex-boyfriend, ex-husband and her daughter saying goodbye to them. Patient does not required intubation during this hospitalization. Patient reported she has been depressed and stressed about disability was rejected recently, has no financial support and does not feel like she is ready to go back to work after taking long-term disability since September 2015. She also reported stopped taking all her mental health medications except alprazolam for the last 15 days. Patient reported that she promised to get god she is not going to overdose after 2006  intentional overdose and psychiatric hospitalization. Patient was recently admitted to [facility] for suicidal ideation the month of October 2015. Patient has been seeing outpatient psychiatric services [facility]. Patient reported she has been good at masking her stresses and symptoms from the family members and mental health providers. Patient cannot contract for safety at this time and needed acute psychiatric hospitalization. Dr. Sanjuana Letters stated that her IV antibiotics can be changed to oral antibiotics for urinary tract infection if she needed to be placed in acute psychiatric hospital. Patient is index screen is positive for benzodiazepines.  Medical history: Patient is a 49 year old female with bipolar disorder, history of suicidal attempts 2 prior to now and morbid obesity, who comes in after she took about 50 tablets of 1 mg strength Xanax around 4:00 this morning per her on account. She apparently texted a friend and her daughter saying goodbye. She has been referred to the medical service as she remains weak. The weakness may be related to UTI, otherwise her labs are within normal limits and she is oxygenating adequately on room air. Apparently she could not walk in the emergency department hence concern that she may need management on the medical side before referral to psychiatry. She is otherwise lucid and responding to questions appropriately. Will start IV antibiotics, monitor overnight and consult psychiatry in a.m. She will have bedside sitter in the meanwhile. Will defer management of bipolar disorder to psychiatry. Of note is that patient stopped taking her regular psych medications about 3 weeks ago.  HPI Elements:   Location:  Bipolar depression. Quality:  Loss of hope, helpless and feels worthless. Severity:  Suicidal attempt by intentional overdose on medication. Timing:  Long-term disability ending and state rejected social  security  disability. Duration:  Few months. Context:  She won't end her life and feels she cannot be struggling.  Past Medical History:  Past Medical History  Diagnosis Date  . Mental disorder   . Anxiety   . Depression   . IBS (irritable bowel syndrome)   . Headache(784.0)    History reviewed. No pertinent past surgical history. Family History:  Family History  Problem Relation Age of Onset  . Depression Maternal Aunt   . Alcohol abuse Maternal Aunt   . Drug abuse Maternal Aunt   . Drug abuse Father   . Alcohol abuse Paternal Aunt   . Drug abuse Paternal Aunt   . Alcohol abuse Maternal Uncle   . Drug abuse Maternal Uncle   . Alcohol abuse Paternal Uncle   . Drug abuse Paternal Uncle    Social History:  History  Alcohol Use No     History  Drug Use No    History   Social History  . Marital Status: Divorced    Spouse Name: N/A  . Number of Children: N/A  . Years of Education: N/A   Social History Main Topics  . Smoking status: Never Smoker   . Smokeless tobacco: Not on file  . Alcohol Use: No  . Drug Use: No  . Sexual Activity: Not Currently   Other Topics Concern  . None   Social History Narrative   Additional Social History:                          Allergies:   Allergies  Allergen Reactions  . Vicodin [Hydrocodone-Acetaminophen] Hives and Itching    Vitals: Blood pressure 130/92, pulse 101, temperature 98.2 F (36.8 C), temperature source Oral, resp. rate 18, height 5' (1.524 m), weight 115.304 kg (254 lb 3.2 oz), SpO2 93 %.  Risk to Self: Is patient at risk for suicide?: Yes Risk to Others:   Prior Inpatient Therapy:   Prior Outpatient Therapy:    Current Facility-Administered Medications  Medication Dose Route Frequency Provider Last Rate Last Dose  . acetaminophen (TYLENOL) tablet 650 mg  650 mg Oral Q6H PRN Simbiso Ranga, MD       Or  . acetaminophen (TYLENOL) suppository 650 mg  650 mg Rectal Q6H PRN Simbiso Ranga, MD      . alum &  mag hydroxide-simeth (MAALOX/MYLANTA) 200-200-20 MG/5ML suspension 30 mL  30 mL Oral Q6H PRN Simbiso Ranga, MD      . buPROPion (WELLBUTRIN XL) 24 hr tablet 150 mg  150 mg Oral Daily Simbiso Ranga, MD   150 mg at 07/02/14 1125  . butalbital-acetaminophen-caffeine (FIORICET, ESGIC) 50-325-40 MG per tablet 1 tablet  1 tablet Oral Q4H PRN Simbiso Ranga, MD      . cefTRIAXone (ROCEPHIN) 1 g in dextrose 5 % 50 mL IVPB - Premix  1 g Intravenous Q24H Simbiso Ranga, MD   1 g at 07/01/14 2233  . dicyclomine (BENTYL) tablet 20 mg  20 mg Oral Q6H PRN Simbiso Ranga, MD      . enoxaparin (LOVENOX) injection 40 mg  40 mg Subcutaneous Q24H Simbiso Ranga, MD   40 mg at 07/01/14 1741  . FLUoxetine (PROZAC) capsule 60 mg  60 mg Oral Daily Simbiso Ranga, MD   60 mg at 07/02/14 1125  . ondansetron (ZOFRAN) tablet 4 mg  4 mg Oral Q6H PRN Simbiso Ranga, MD       Or  . ondansetron (ZOFRAN) injection  4 mg  4 mg Intravenous Q6H PRN Simbiso Ranga, MD      . pantoprazole (PROTONIX) EC tablet 40 mg  40 mg Oral Daily Simbiso Ranga, MD   40 mg at 07/02/14 1125  . zolpidem (AMBIEN) tablet 5 mg  5 mg Oral QHS PRN Simbiso Ranga, MD        Musculoskeletal: Strength & Muscle Tone: decreased Gait & Station: unable to stand Patient leans: N/A  Psychiatric Specialty Exam: Physical Exam as per the history and physical examination   ROS drowsy, weakness and unsteady on feet, depressed, hopeless, and helpless   Blood pressure 130/92, pulse 101, temperature 98.2 F (36.8 C), temperature source Oral, resp. rate 18, height 5' (1.524 m), weight 115.304 kg (254 lb 3.2 oz), SpO2 93 %.Body mass index is 49.64 kg/(m^2).  General Appearance: Guarded  Eye Contact::  Fair  Speech:  Clear and Coherent and Slow  Volume:  Decreased  Mood:  Anxious, Depressed, Hopeless and Worthless  Affect:  Depressed and Flat  Thought Process:  Coherent and Goal Directed  Orientation:  Full (Time, Place, and Person)  Thought Content:  Rumination   Suicidal Thoughts:  Yes.  with intent/plan  Homicidal Thoughts:  No  Memory:  Immediate;   Fair Recent;   Fair  Judgement:  Impaired  Insight:  Fair  Psychomotor Activity:  Psychomotor Retardation  Concentration:  Fair  Recall:  Good  Fund of Knowledge:Good  Language: Good  Akathisia:  Negative  Handed:  Right  AIMS (if indicated):     Assets:  Communication Skills Housing Leisure Time Physical Health Resilience Social Support Talents/Skills  ADL's:  Impaired  Cognition: WNL  Sleep:      Medical Decision Making: Review of Psycho-Social Stressors (1), Review or order clinical lab tests (1), Established Problem, Worsening (2), Review of Medication Regimen & Side Effects (2) and Review of New Medication or Change in Dosage (2)  Treatment Plan Summary: Daily contact with patient to assess and evaluate symptoms and progress in treatment and Medication management  Plan: Agree with restarting Wellbutrin XL 150 mg daily, fluoxetine 60 mg daily Monitor for the lithium levels and may reintroduce Latuda Monitor for benzodiazepine withdrawals seizures and treat as needed Recommend psychiatric Inpatient admission when medically cleared. Supportive therapy provided about ongoing stressors. Appreciate psychiatric consultation and follow up as clinically required Please contact [phone] or [phone] if needs further assistance  Disposition: Patient meets criteria for acute psychiatric hospitalization for crisis stabilization, safety monitoring on medication management of bipolar depression and psychosocial support.  Taegen Lennox,JANARDHAHA R. 07/02/2014 11:28 AM

## 2014-07-02 NOTE — Progress Notes (Signed)
[facility] PROGRESS NOTE  Patricia Rodriguez IEP:[id number] DOB: 16-Aug-1965 DOA: 07/01/2014 PCP: Maggie Font, MD  Summary Appreciate psychiatry. Patricia Rodriguez is a pleasant 49 year old female with bipolar disorder, history of suicidal attempts 2 prior to now and morbid obesity, who came in after she took about 50 tablets of 1 mg strength Xanax in morning of admission per patient's account. She apparently texted a friend and her daughter saying goodbye. She was referred to the medical service for admission as she was noted to have generalized weakness. The weakness may be related to UTI. She is on ceftriaxone pending final urine culture results. Today her potassium is slightly low and this will be replenished. Hopefully, patient will be medically stable in the next 24 hours to be able to transfer to psychiatry inpatient. Will continue ceftriaxone, replenish potassium and ambulate as necessary. Will continue bipolar medications per psychiatry. Plan Overdose of benzodiazepine/bipolar disorder/Suicidal ideation  Bedside sitter.  Follow psychiatry recommendations  For transfer to psychiatry inpatient once medically stable hopefully tomorrow. UTI/hypokalemia  Follow urine culture  Continue Ceftriaxone IV to complete 5-7 days of antibiotic  Replenish potassium Obesity, morbid  No acute changes DVT/GI Prophylaxis  Lovenox Family Communication: Patient's condition and plan of care including tests being ordered have been discussed with the patient and family including mother who indicate understanding and agree with the plan.  Code Status   Full  Consultants:  Psychiatry  Procedures:  None  Antibiotics:  Ceftriaxone 07/01/2014>>  HPI/Subjective: Feels better. Complains of suprapubic pain.  Objective: Filed Vitals:   07/02/14 1252  BP: 137/89  Pulse: 100  Temp: 98.6 F (37 C)  Resp: 18    Intake/Output Summary (Last 24 hours) at 07/02/14 2144 Last data  filed at 07/02/14 1938  Gross per 24 hour  Intake   1010 ml  Output      0 ml  Net   1010 ml   Filed Weights   07/01/14 2100  Weight: 115.304 kg (254 lb 3.2 oz)    Exam:   General:  Comfortable at rest.  Cardiovascular: S1-S2 normal. No murmurs. Pulse regular.  Respiratory: Good air entry bilaterally. No rhonchi or rales.  Abdomen: Soft and nontender. Normal bowel sounds. No organomegaly.  Musculoskeletal: No pedal edema   Neurological: Intact  Data Reviewed: Basic Metabolic Panel:  Recent Labs Lab 07/01/14 1240 07/02/14 0420  NA 140 139  K 4.2 3.1*  CL 101 101  CO2 31 32  GLUCOSE 121* 184*  BUN 7 10  CREATININE 0.96 1.06  CALCIUM 8.4 8.3*   Liver Function Tests:  Recent Labs Lab 07/01/14 1240 07/02/14 0420  AST 21 16  ALT 19 16  ALKPHOS 59 57  BILITOT 0.6 0.2*  PROT 7.4 6.9  ALBUMIN 3.5 3.3*   No results for input(s): LIPASE, AMYLASE in the last 168 hours. No results for input(s): AMMONIA in the last 168 hours. CBC:  Recent Labs Lab 07/01/14 1240 07/02/14 0420  WBC 7.5 7.8  NEUTROABS 5.0  --   HGB 12.7 11.5*  HCT 41.8 36.8  MCV 86.0 86.2  PLT 406* 399   Cardiac Enzymes: No results for input(s): CKTOTAL, CKMB, CKMBINDEX, TROPONINI in the last 168 hours. BNP (last 3 results) No results for input(s): BNP in the last 8760 hours.  ProBNP (last 3 results) No results for input(s): PROBNP in the last 8760 hours.  CBG: No results for input(s): GLUCAP in the last 168 hours.  No results found for this or any  previous visit (from the past 240 hour(s)).   Studies: Ct Head Wo Contrast  07/01/2014   CLINICAL DATA:  intentional ingestion of 50-60 1mg  Xanax tablets around 0245 and suicidal w/ plan to OD. Denies pain. Denies HI/AV. Hx of previous suicide attempts. A & Ox4. Hx of anxiety and depression  EXAM: CT HEAD WITHOUT CONTRAST  TECHNIQUE: Contiguous axial images were obtained from the base of the skull through the vertex without intravenous  contrast.  COMPARISON:  07/18/ 2005  FINDINGS: There is no evidence of acute intracranial hemorrhage, brain edema, mass lesion, acute infarction, mass effect, or midline shift. Acute infarct may be inapparent on noncontrast CT. No other intra-axial abnormalities are seen, and the ventricles and sulci are within normal limits in size and symmetry. No abnormal extra-axial fluid collections or masses are identified. No significant calvarial abnormality.  IMPRESSION: 1. Negative for bleed or other acute intracranial process.   Electronically Signed   By: Lucrezia Europe M.D.   On: 07/01/2014 13:18   Dg Chest Port 1 View  07/01/2014   CLINICAL DATA:  Drug ingestion.  EXAM: PORTABLE CHEST - 1 VIEW  COMPARISON:  None.  FINDINGS: Lung volumes are low bilaterally. There is elevation of the left hemidiaphragm. There is no evidence of pulmonary edema, consolidation, pneumothorax, nodule or pleural fluid. The heart size and mediastinal contours are within normal limits. Visualized bony structures are unremarkable.  IMPRESSION: No active disease.   Electronically Signed   By: Aletta Edouard M.D.   On: 07/01/2014 12:58    Scheduled Meds: . buPROPion  150 mg Oral Daily  . cefTRIAXone (ROCEPHIN)  IV  1 g Intravenous Q24H  . enoxaparin (LOVENOX) injection  40 mg Subcutaneous Q24H  . FLUoxetine  60 mg Oral Daily  . pantoprazole  40 mg Oral Daily   Continuous Infusions:    Time spent: 20 minutes    Patricia Rodriguez  [facility] Pager [phone]. If 7PM-7AM, please contact night-coverage at [email or web address], password [facility] 07/02/2014, 9:44 PM  LOS: 1 day
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
## 2014-07-02 NOTE — Evaluation (Signed)
Physical Therapy Evaluation Patient Details Name: Patricia Rodriguez MRN: [id number] DOB: Jun 04, 1965 Today's Date: 07/02/2014   History of Present Illness  Patient is a 49 y.o. female admitted after overdose on Xanax.  She has PMH of bipolar d/o with previous suicide attempts and IBS.  Clinical Impression  Patient presents with decreased mobility due to deficits listed in PT problem list.  She will benefit from skilled PT in the acute setting to allow return home or safe d/c to inpatient psychiatric unit.  Encouraged pt with support to walk to bathroom in room and to mobilize only when mostly alert.    Follow Up Recommendations Home health PT (if pt d/c home)    Equipment Recommendations  None recommended by PT (mother plans for her to use her walker if needed)    Recommendations for Other Services       Precautions / Restrictions Precautions Precautions: Fall Precaution Comments: suicide precautions      Mobility  Bed Mobility Overal bed mobility: Needs Assistance Bed Mobility: Supine to Sit     Supine to sit: HOB elevated;Mod assist     General bed mobility comments: due to lethargy increased assist; family and sitter report patient more independent when alert  Transfers Overall transfer level: Needs assistance Equipment used: Rolling walker (2 wheeled) Transfers: Sit to/from Stand Sit to Stand: Min assist         General transfer comment: pulled up to stand on my hands due to lethargy  Ambulation/Gait Ambulation/Gait assistance: Min assist Ambulation Distance (Feet): 150 Feet   Gait Pattern/deviations: Step-through pattern;Trunk flexed;Wide base of support (left hand falling off walker)     General Gait Details: cues for eyes open, for posture, for keeping left hand on walker; mechanics and alertness improved in hallway when she saw her ex-boyfriend coming for a visit  Stairs            Wheelchair Mobility    Modified Rankin (Stroke Patients  Only)       Balance Overall balance assessment: Needs assistance   Sitting balance-Leahy Scale: Fair Sitting balance - Comments: at times posterior lean, but self corrects     Standing balance-Leahy Scale: Poor Standing balance comment: due to lethargy anterior bias                             Pertinent Vitals/Pain Pain Assessment: No/denies pain    Home Living Family/patient expects to be discharged to:: Private residence (may go to inpatient psychiatric center in  [facility]) Living Arrangements: Alone (ex-boyfriend present end of session states he lives with her, her mother states she lives alone) Available Help at Discharge: Family;Available PRN/intermittently Type of Home: House Home Access: Stairs to enter   Entrance Stairs-Number of Steps: 1 Home Layout: One level Home Equipment: None (mother states she can use her rolling walker)      Prior Function Level of Independence: Independent               Hand Dominance        Extremity/Trunk Assessment   Upper Extremity Assessment: Generalized weakness           Lower Extremity Assessment: Overall WFL for tasks assessed         Communication   Communication: Other (comment);Expressive difficulties (slowed speech, dysarthric)  Cognition Arousal/Alertness: Lethargic Behavior During Therapy: Flat affect Overall Cognitive Status: Difficult to assess  General Comments      Exercises        Assessment/Plan    PT Assessment Patient needs continued PT services  PT Diagnosis Generalized weakness;Abnormality of gait   PT Problem List Decreased strength;Decreased safety awareness;Decreased balance;Decreased knowledge of use of DME;Decreased activity tolerance  PT Treatment Interventions DME instruction;Therapeutic exercise;Gait training;Balance training;Functional mobility training;Therapeutic activities;Patient/family education   PT Goals (Current goals  can be found in the Care Plan section) Acute Rehab PT Goals Patient Stated Goal: To walk independent PT Goal Formulation: With patient/family Time For Goal Achievement: 07/09/14 Potential to Achieve Goals: Good    Frequency Min 3X/week   Barriers to discharge        Co-evaluation               End of Session Equipment Utilized During Treatment: Gait belt Activity Tolerance: Patient limited by lethargy Patient left: in bed;with call bell/phone within reach;with nursing/sitter in room;with family/visitor present Nurse Communication: Mobility status    Functional Assessment Tool Used: Clinical judgement Functional Limitation: Mobility: Walking and moving around Mobility: Walking and Moving Around Current Status [phone]): At least 20 percent but less than 40 percent impaired, limited or restricted Mobility: Walking and Moving Around Goal Status [phone]): At least 1 percent but less than 20 percent impaired, limited or restricted    Time: 1010-1038 PT Time Calculation (min) (ACUTE ONLY): 28 min   Charges:   PT Evaluation $Initial PT Evaluation Tier I: 1 Procedure PT Treatments $Gait Training: 8-22 mins   PT G Codes:   PT G-Codes **NOT FOR INPATIENT CLASS** Functional Assessment Tool Used: Clinical judgement Functional Limitation: Mobility: Walking and moving around Mobility: Walking and Moving Around Current Status (O1157): At least 20 percent but less than 40 percent impaired, limited or restricted Mobility: Walking and Moving Around Goal Status [phone]): At least 1 percent but less than 20 percent impaired, limited or restricted    [facility] 07/02/2014, 10:49 AM Magda Kiel, PT [phone] 07/02/2014

## 2014-07-03 DIAGNOSIS — F319 Bipolar disorder, unspecified: Secondary | ICD-10-CM

## 2014-07-03 LAB — CBC
HEMATOCRIT: 37 % (ref 36.0–46.0)
HEMOGLOBIN: 11.2 g/dL — AB (ref 12.0–15.0)
MCH: 26.2 pg (ref 26.0–34.0)
MCHC: 30.3 g/dL (ref 30.0–36.0)
MCV: 86.4 fL (ref 78.0–100.0)
PLATELETS: 424 10*3/uL — AB (ref 150–400)
RBC: 4.28 MIL/uL (ref 3.87–5.11)
RDW: 14.9 % (ref 11.5–15.5)
WBC: 8.4 10*3/uL (ref 4.0–10.5)

## 2014-07-03 LAB — BASIC METABOLIC PANEL
Anion gap: 10 (ref 5–15)
BUN: 10 mg/dL (ref 6–23)
CHLORIDE: 102 mmol/L (ref 96–112)
CO2: 28 mmol/L (ref 19–32)
Calcium: 8.3 mg/dL — ABNORMAL LOW (ref 8.4–10.5)
Creatinine, Ser: 0.87 mg/dL (ref 0.50–1.10)
GFR, EST NON AFRICAN AMERICAN: 78 mL/min — AB (ref >90)
Glucose, Bld: 180 mg/dL — ABNORMAL HIGH (ref 70–99)
POTASSIUM: 3.7 mmol/L (ref 3.5–5.1)
Sodium: 140 mmol/L (ref 135–145)

## 2014-07-03 LAB — URINE CULTURE: Colony Count: 60000

## 2014-07-03 LAB — MAGNESIUM: MAGNESIUM: 2.2 mg/dL (ref 1.5–2.5)

## 2014-07-03 MED ORDER — CIPROFLOXACIN HCL 500 MG PO TABS: 500.0000 mg | ORAL_TABLET | Freq: Two times a day (BID) | ORAL | Status: DC

## 2014-07-03 NOTE — Clinical Social Work Note (Signed)
Patient accepted at [facility] and is agreeable to plans to go voluntary.  RN to call report and [facility] to transport. Patient and mother at bedside agree to plans- patient asking how many days she will have to be there- CSW has advised her they will assess and decide.   Eduard Clos, MSW, [place]

## 2014-07-03 NOTE — Progress Notes (Signed)
Pt has been discharged to [facility].

## 2014-07-03 NOTE — Clinical Social Work Note (Signed)
CSW spoke with MD who indicates patient is medically stable for d/c. CSW is checking with area inpatient units for bed availability- will advise.   Eduard Clos, MSW, [place]

## 2014-07-03 NOTE — Discharge Summary (Addendum)
Physician Discharge Summary  Patricia Rodriguez XBM:[id number] DOB: [birth year] DOA: 07/01/2014  PCP: Maggie Font, MD  Admit date: 07/01/2014 Discharge date: 07/03/2014  Time spent: 35 minutes minutes  Recommendations for Outpatient Follow-up:  1. Ciprofloxacin 500 mg by mouth twice a day 3 days for urinary tract infection   Discharge Diagnoses:  Principal Problem:   Bipolar disorder Active Problems:   Obesity, morbid   Overdose of benzodiazepine   Suicidal ideation   UTI (urinary tract infection)   Hypokalemia   Discharge Condition: Medically stable for discharge to inpatient psychiatric facility  Diet recommendation: Heart healthy diet  Filed Weights   07/01/14 2100  Weight: 115.304 kg (254 lb 3.2 oz)    History of present illness:  Patricia Rodriguez is a pleasant 49 year old female with bipolar disorder, history of suicidal attempts 2 prior to now and morbid obesity, who comes in after she took about 50 tablets of 1 mg strength Xanax around 4:00 this morning per her on account. She apparently texted a friend and her daughter saying goodbye. She has been referred to the medical service as she remains weak. The weakness may be related to UTI, otherwise her labs are within normal limits and she is oxygenating adequately on room air. Apparently she could not walk in the emergency department hence concern that she may need management on the medical side before referral to psychiatry. She is otherwise lucid and responding to questions appropriately. Will start IV antibiotics, monitor overnight and consult psychiatry in a.m. She will have bedside sitter in the meanwhile. Will defer management of bipolar disorder to psychiatry. Of note is that patient stopped taking her regular psych medications about 3 weeks ago.  Hospital Course:  Patient is a pleasant 48 year old female with past medical history major depression, bipolar disorder, history of suicide attempts, who was admitted to  the medicine service on 07/01/2014 after taking approximately 50 tablets of 1mg  strength Xanax. She reported having severe depression, with feelings of wanting to harm herself. She was monitored closely with bedside sitter. Lab work remained unremarkable. UA showed evidence of UTI which she was treated with empiric antibiotic therapy. During this hospitalization she was evaluated by psychiatry, who recommended inpatient psych he had trach care admission when medically stable. Social work was consulted, she was accepted to [facility] inpatient psychiatric facility. Patient was transferred in stable condition on 07/03/2014.   Consultations:  Psychiatry  Discharge Exam: Filed Vitals:   07/03/14 1429  BP: 163/72  Pulse: 91  Temp: 97.8 F (36.6 C)  Resp: 20    General: No acute distress, she is awake and alert Cardiovascular: Regular rate and rhythm normal S1-S2 no murmurs rubs gallops Respiratory: Normal respiratory effort, lungs are clear to auscultation bilaterally Abdomen: Soft nontender nondistended positive bowel sounds Extremities: No edema  Discharge Instructions   Discharge Instructions    Call MD for:  difficulty breathing, headache or visual disturbances    Complete by:  As directed      Call MD for:  extreme fatigue    Complete by:  As directed      Call MD for:  hives    Complete by:  As directed      Call MD for:  persistant dizziness or light-headedness    Complete by:  As directed      Call MD for:  persistant nausea and vomiting    Complete by:  As directed      Call MD for:  redness, tenderness, or  signs of infection (pain, swelling, redness, odor or green/yellow discharge around incision site)    Complete by:  As directed      Call MD for:  severe uncontrolled pain    Complete by:  As directed      Call MD for:  temperature >100.4    Complete by:  As directed      Diet - low sodium heart healthy    Complete by:  As directed      Diet - low sodium heart  healthy    Complete by:  As directed      Increase activity slowly    Complete by:  As directed      Increase activity slowly    Complete by:  As directed           Current Discharge Medication List    START taking these medications   Details  ciprofloxacin (CIPRO) 500 MG tablet Take 1 tablet (500 mg total) by mouth 2 (two) times daily. Qty: 6 tablet, Refills: 0      CONTINUE these medications which have NOT CHANGED   Details  dicyclomine (BENTYL) 20 MG tablet Take 20 mg by mouth every 6 (six) hours as needed (for irritable bowel syndrome).     FLUoxetine HCl 60 MG TABS Take 60 mg by mouth daily.    lithium carbonate 300 MG capsule Take 600 mg by mouth 2 (two) times daily with a meal.    pantoprazole (PROTONIX) 20 MG tablet Take 20 mg by mouth.    buPROPion (WELLBUTRIN XL) 150 MG 24 hr tablet Take 150 mg by mouth daily.       STOP taking these medications     ALPRAZolam (XANAX) 1 MG tablet      aspirin-acetaminophen-caffeine (EXCEDRIN MIGRAINE) 250-250-65 MG per tablet      Aspirin-Salicylamide-Caffeine (BC HEADACHE POWDER PO)      butalbital-acetaminophen-caffeine (FIORICET, ESGIC) 50-325-40 MG per tablet      lurasidone (LATUDA) 40 MG TABS tablet      ibuprofen (ADVIL,MOTRIN) 600 MG tablet        Allergies  Allergen Reactions  . Vicodin [Hydrocodone-Acetaminophen] Hives and Itching      The results of significant diagnostics from this hospitalization (including imaging, microbiology, ancillary and laboratory) are listed below for reference.    Significant Diagnostic Studies: Ct Head Wo Contrast  07/01/2014   CLINICAL DATA:  intentional ingestion of 50-60 1mg  Xanax tablets around 0245 and suicidal w/ plan to OD. Denies pain. Denies HI/AV. Hx of previous suicide attempts. A & Ox4. Hx of anxiety and depression  EXAM: CT HEAD WITHOUT CONTRAST  TECHNIQUE: Contiguous axial images were obtained from the base of the skull through the vertex without intravenous  contrast.  COMPARISON:  07/18/ 2005  FINDINGS: There is no evidence of acute intracranial hemorrhage, brain edema, mass lesion, acute infarction, mass effect, or midline shift. Acute infarct may be inapparent on noncontrast CT. No other intra-axial abnormalities are seen, and the ventricles and sulci are within normal limits in size and symmetry. No abnormal extra-axial fluid collections or masses are identified. No significant calvarial abnormality.  IMPRESSION: 1. Negative for bleed or other acute intracranial process.   Electronically Signed   By: Lucrezia Europe M.D.   On: 07/01/2014 13:18   Dg Chest Port 1 View  07/01/2014   CLINICAL DATA:  Drug ingestion.  EXAM: PORTABLE CHEST - 1 VIEW  COMPARISON:  None.  FINDINGS: Lung volumes are low bilaterally. There is elevation  of the left hemidiaphragm. There is no evidence of pulmonary edema, consolidation, pneumothorax, nodule or pleural fluid. The heart size and mediastinal contours are within normal limits. Visualized bony structures are unremarkable.  IMPRESSION: No active disease.   Electronically Signed   By: Aletta Edouard M.D.   On: 07/01/2014 12:58    Microbiology: Recent Results (from the past 240 hour(s))  Culture, Urine     Status: None   Collection Time: 07/01/14 12:47 PM  Result Value Ref Range Status   Specimen Description URINE, RANDOM  Final   Special Requests NONE  Final   Colony Count   Final    60,000 COLONIES/ML Performed at [facility]    Culture   Final    Multiple bacterial morphotypes present, none predominant. Suggest appropriate recollection if clinically indicated. Performed at [facility]    Report Status 07/03/2014 FINAL  Final     Labs: Basic Metabolic Panel:  Recent Labs Lab 07/01/14 1240 07/02/14 0420 07/03/14 0427  NA 140 139 140  K 4.2 3.1* 3.7  CL 101 101 102  CO2 31 32 28  GLUCOSE 121* 184* 180*  BUN 7 10 10   CREATININE 0.96 1.06 0.87  CALCIUM 8.4 8.3* 8.3*  MG  --   --  2.2    Liver Function Tests:  Recent Labs Lab 07/01/14 1240 07/02/14 0420  AST 21 16  ALT 19 16  ALKPHOS 59 57  BILITOT 0.6 0.2*  PROT 7.4 6.9  ALBUMIN 3.5 3.3*   No results for input(s): LIPASE, AMYLASE in the last 168 hours. No results for input(s): AMMONIA in the last 168 hours. CBC:  Recent Labs Lab 07/01/14 1240 07/02/14 0420 07/03/14 0427  WBC 7.5 7.8 8.4  NEUTROABS 5.0  --   --   HGB 12.7 11.5* 11.2*  HCT 41.8 36.8 37.0  MCV 86.0 86.2 86.4  PLT 406* 399 424*   Cardiac Enzymes: No results for input(s): CKTOTAL, CKMB, CKMBINDEX, TROPONINI in the last 168 hours. BNP: BNP (last 3 results) No results for input(s): BNP in the last 8760 hours.  ProBNP (last 3 results) No results for input(s): PROBNP in the last 8760 hours.  CBG: No results for input(s): GLUCAP in the last 168 hours.     SignedKelvin Cellar  [facility] 07/03/2014, 2:41 PM

## 2014-07-03 NOTE — Consult Note (Signed)
Psychiatry Consult followup  Reason for Consult:  Bipolar disorder and xanax overdose Referring Physician:  Dr. Sanjuana Letters Patient Identification: Patricia Rodriguez MRN:  [id number] Principal Diagnosis: Bipolar disorder Diagnosis:   Patient Active Problem List   Diagnosis Date Noted  . Hypokalemia [E87.6] 07/02/2014  . Overdose of benzodiazepine [T42.4X1A] 07/01/2014  . Bipolar disorder [F31.9] 07/01/2014  . Suicidal ideation [R45.851] 07/01/2014  . UTI (urinary tract infection) [N39.0] 07/01/2014  . ONYCHOMYCOSIS [B35.1] 06/16/2006  . Obesity, morbid [E66.01] 06/16/2006  . 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 ECZEMA, ATOPIC DERMATITIS [L20.9] 06/16/2006    Total Time spent with patient: 20 minutes  Subjective:   [name] is a 49 y.o. female patient admitted with alprazolam overdose with suicide intent.  HPI:  Patricia Rodriguez is a 49 years old female seen and chart reviewed for psychiatric consultation and evaluation of depression with suicidal attempt by intentional overdose of alprazolam. Patient mother is at bedside during this evaluation and reportedly concerned about her safety secondary to previous suicidal attempts and exacerbated symptoms of depression and anxiety. Patient reportedly to approximately 40-50 mg of alprazolam and then communicated with the 2 or 3 people including her ex-boyfriend, ex-husband and her daughter saying goodbye to them. Patient does not required intubation during this hospitalization. Patient reported she has been depressed and stressed about disability was rejected recently, has no financial support and does not feel like she is ready to go back to work after taking long-term disability since September 2015. She also reported stopped taking all her mental health medications except alprazolam for the last 15 days. Patient reported that she promised to get god she is not going to  overdose after 2006 intentional overdose and psychiatric hospitalization. Patient was recently admitted to old [facility] for suicidal ideation the month of October 2015. Patient has been seeing outpatient psychiatric services at [facility]. Patient reported she has been good at masking her stresses and symptoms from the family members and mental health providers. Patient cannot contract for safety at this time and needed acute psychiatric hospitalization. Dr. Sanjuana Letters stated that her IV antibiotics can be changed to oral antibiotics for urinary tract infection if she needed to be placed in acute psychiatric hospital. Patient is index screen is positive for benzodiazepines.  Interval History: Patient has no new complaints and stated that she is willing to be placed in [place] and has previous admission at the same place and her past treatment is satisfactory. Her antibiotics can be changed to oral at discharge from this hospital. She continue to meet criteria for acute psych admission. She was accepted at [facility] as per social service and may transfer soon.  Medical history: Patient is a 49 year old female with bipolar disorder, history of suicidal attempts 2 prior to now and morbid obesity, who comes in after she took about 50 tablets of 1 mg strength Xanax around 4:00 this morning per her on account. She apparently texted a friend and her daughter saying goodbye. She has been referred to the medical service as she remains weak. The weakness may be related to UTI, otherwise her labs are within normal limits and she is oxygenating adequately on room air. Apparently she could not walk in the emergency department hence concern that she may need management on the medical side before referral to psychiatry. She is otherwise lucid and responding to questions appropriately. Will start IV antibiotics, monitor overnight  and consult psychiatry in a.m. She will have  bedside sitter in the meanwhile. Will defer management of bipolar disorder to psychiatry. Of note is that patient stopped taking her regular psych medications about 3 weeks ago.    Past Medical History:  Past Medical History  Diagnosis Date  . Mental disorder   . Anxiety   . Depression   . IBS (irritable bowel syndrome)   . Headache(784.0)    History reviewed. No pertinent past surgical history. Family History:  Family History  Problem Relation Age of Onset  . Depression Maternal Aunt   . Alcohol abuse Maternal Aunt   . Drug abuse Maternal Aunt   . Drug abuse Father   . Alcohol abuse Paternal Aunt   . Drug abuse Paternal Aunt   . Alcohol abuse Maternal Uncle   . Drug abuse Maternal Uncle   . Alcohol abuse Paternal Uncle   . Drug abuse Paternal Uncle    Social History:  History  Alcohol Use No     History  Drug Use No    History   Social History  . Marital Status: Divorced    Spouse Name: N/A  . Number of Children: N/A  . Years of Education: N/A   Social History Main Topics  . Smoking status: Never Smoker   . Smokeless tobacco: Not on file  . Alcohol Use: No  . Drug Use: No  . Sexual Activity: Not Currently   Other Topics Concern  . None   Social History Narrative   Additional Social History:                          Allergies:   Allergies  Allergen Reactions  . Vicodin [Hydrocodone-Acetaminophen] Hives and Itching    Vitals: Blood pressure 108/81, pulse 87, temperature 96.6 F (35.9 C), temperature source Axillary, resp. rate 24, height 5' (1.524 m), weight 115.304 kg (254 lb 3.2 oz), SpO2 94 %.  Risk to Self: Is patient at risk for suicide?: Yes Risk to Others:   Prior Inpatient Therapy:   Prior Outpatient Therapy:    Current Facility-Administered Medications  Medication Dose Route Frequency Provider Last Rate Last Dose  . acetaminophen (TYLENOL) tablet 650 mg  650 mg Oral Q6H PRN Simbiso Ranga, MD       Or  . acetaminophen  (TYLENOL) suppository 650 mg  650 mg Rectal Q6H PRN Simbiso Ranga, MD      . alum & mag hydroxide-simeth (MAALOX/MYLANTA) 200-200-20 MG/5ML suspension 30 mL  30 mL Oral Q6H PRN Simbiso Ranga, MD      . buPROPion (WELLBUTRIN XL) 24 hr tablet 150 mg  150 mg Oral Daily Simbiso Ranga, MD   150 mg at 07/03/14 1256  . butalbital-acetaminophen-caffeine (FIORICET, ESGIC) 50-325-40 MG per tablet 1 tablet  1 tablet Oral Q4H PRN Simbiso Ranga, MD      . cefTRIAXone (ROCEPHIN) 1 g in dextrose 5 % 50 mL IVPB - Premix  1 g Intravenous Q24H Simbiso Ranga, MD   1 g at 07/02/14 2100  . dicyclomine (BENTYL) tablet 20 mg  20 mg Oral Q6H PRN Simbiso Ranga, MD      . enoxaparin (LOVENOX) injection 40 mg  40 mg Subcutaneous Q24H Simbiso Ranga, MD   40 mg at 07/02/14 2011  . FLUoxetine (PROZAC) capsule 60 mg  60 mg Oral Daily Simbiso Ranga, MD   60 mg at 07/03/14 1256  . ondansetron (ZOFRAN) tablet 4 mg  4  mg Oral Q6H PRN Simbiso Ranga, MD       Or  . ondansetron (ZOFRAN) injection 4 mg  4 mg Intravenous Q6H PRN Simbiso Ranga, MD      . pantoprazole (PROTONIX) EC tablet 40 mg  40 mg Oral Daily Simbiso Ranga, MD   40 mg at 07/03/14 1256  . zolpidem (AMBIEN) tablet 5 mg  5 mg Oral QHS PRN Simbiso Ranga, MD        Musculoskeletal: Strength & Muscle Tone: decreased Gait & Station: unable to stand Patient leans: N/A  Psychiatric Specialty Exam: Physical Exam as per the history and physical examination   ROS drowsy, weakness and unsteady on feet, depressed, hopeless, and helpless   Blood pressure 108/81, pulse 87, temperature 96.6 F (35.9 C), temperature source Axillary, resp. rate 24, height 5' (1.524 m), weight 115.304 kg (254 lb 3.2 oz), SpO2 94 %.Body mass index is 49.64 kg/(m^2).  General Appearance: Guarded  Eye Contact::  Fair  Speech:  Clear and Coherent and Slow  Volume:  Decreased  Mood:  Anxious, Depressed, Hopeless and Worthless  Affect:  Depressed and Flat  Thought Process:  Coherent and Goal  Directed  Orientation:  Full (Time, Place, and Person)  Thought Content:  Rumination  Suicidal Thoughts:  Yes.  with intent/plan  Homicidal Thoughts:  No  Memory:  Immediate;   Fair Recent;   Fair  Judgement:  Impaired  Insight:  Fair  Psychomotor Activity:  Psychomotor Retardation  Concentration:  Fair  Recall:  Good  Fund of Knowledge:Good  Language: Good  Akathisia:  Negative  Handed:  Right  AIMS (if indicated):     Assets:  Communication Skills Housing Leisure Time Physical Health Resilience Social Support Talents/Skills  ADL's:  Impaired  Cognition: WNL  Sleep:      Medical Decision Making: Review of Psycho-Social Stressors (1), Review or order clinical lab tests (1), Established Problem, Worsening (2), Review of Medication Regimen & Side Effects (2) and Review of New Medication or Change in Dosage (2)  Treatment Plan Summary: Daily contact with patient to assess and evaluate symptoms and progress in treatment and Medication management  Plan: Continue Wellbutrin XL 150 mg daily, fluoxetine 60 mg daily Monitor for the lithium levels and may reintroduce Latuda Monitor for benzodiazepine withdrawals seizures and treat as needed Recommend psychiatric Inpatient admission when medically cleared. Supportive therapy provided about ongoing stressors. Appreciate psychiatric consultation and follow up as clinically required Please contact [phone] or [phone] if needs further assistance  Disposition: Patient meets criteria for acute psychiatric hospitalization for crisis stabilization, safety monitoring on medication management of bipolar depression and psychosocial support.  Kimari Coudriet,JANARDHAHA R. 07/03/2014 2:16 PM

## 2014-07-11 ENCOUNTER — Other Ambulatory Visit (HOSPITAL_COMMUNITY): Payer: BLUE CROSS/BLUE SHIELD | Attending: Psychiatry | Admitting: Psychiatry

## 2014-07-11 ENCOUNTER — Encounter (HOSPITAL_COMMUNITY): Payer: Self-pay | Admitting: Psychiatry

## 2014-07-11 DIAGNOSIS — F419 Anxiety disorder, unspecified: Secondary | ICD-10-CM | POA: Diagnosis not present

## 2014-07-11 DIAGNOSIS — F332 Major depressive disorder, recurrent severe without psychotic features: Secondary | ICD-10-CM | POA: Diagnosis present

## 2014-07-11 DIAGNOSIS — K589 Irritable bowel syndrome without diarrhea: Secondary | ICD-10-CM | POA: Diagnosis not present

## 2014-07-11 NOTE — Progress Notes (Signed)
Psychiatric Assessment Adult  Patient Identification:  Patricia Rodriguez Date of Evaluation:  07/11/2014 Chief Complaint: just out of inpatient for overdose History of Chief Complaint:  Ms Amescua has a history of mood disorder but I have diagnosed her with major depression which seems to fit better as she does not have a history of discreet manic episodes as far as I can tell.  She has been depressed for years and was recently in IOP and even more recently in inpatient after an overdose.  She apparently had a bad day she said and took one of her ativan pills to sleep, no help so she took another and then on an impulse she took a handful.  This is the same impulse she has all the time.  On this admission she feels she has made a breakthrough and is using the coping skills she has been taught over and over she says.  She believes she can benefit from the IOP as well.  HPI Review of Systems Physical Exam  Depressive Symptoms: depressed mood, insomnia, fatigue, recurrent thoughts of death, suicidal attempt,  (Hypo) Manic Symptoms:   Elevated Mood:  Negative Irritable Mood:  Negative Grandiosity:  Negative Distractibility:  Negative Labiality of Mood:  Negative Delusions:  Negative Hallucinations:  Negative Impulsivity:  Yes Sexually Inappropriate Behavior:  Negative Financial Extravagance:  Negative Flight of Ideas:  Negative  Anxiety Symptoms: Excessive Worry:  Yes Panic Symptoms:  Yes Agoraphobia:  Negative Obsessive Compulsive: Negative  Symptoms: None, Specific Phobias:  Negative Social Anxiety:  Negative  Psychotic Symptoms:  Hallucinations: Negative None Delusions:  Negative Paranoia:  Negative   Ideas of Reference:  Negative  PTSD Symptoms: Ever had a traumatic exposure:  Negative Had a traumatic exposure in the last month:  Negative Re-experiencing: Negative None Hypervigilance:  Negative Hyperarousal: Negative None Avoidance: Negative None  Traumatic Brain  Injury: Negative na  Past Psychiatric History: Diagnosis: bipolar disorder and major depression  Hospitalizations: several  Outpatient Care: sees therapist and psychiatrist  Substance Abuse Care: none  Self-Mutilation: none  Suicidal Attempts: several  Violent Behaviors: none   Past Medical History:   Past Medical History  Diagnosis Date  . Mental disorder   . Anxiety   . Depression   . IBS (irritable bowel syndrome)   . Headache(784.0)    History of Loss of Consciousness:  Negative Seizure History:  Negative Cardiac History:  Negative Allergies:   Allergies  Allergen Reactions  . Vicodin [Hydrocodone-Acetaminophen] Hives and Itching   Current Medications:  Current Outpatient Prescriptions  Medication Sig Dispense Refill  . buPROPion (WELLBUTRIN XL) 150 MG 24 hr tablet Take 150 mg by mouth daily.     . ciprofloxacin (CIPRO) 500 MG tablet Take 1 tablet (500 mg total) by mouth 2 (two) times daily. 6 tablet 0  . dicyclomine (BENTYL) 20 MG tablet Take 20 mg by mouth every 6 (six) hours as needed (for irritable bowel syndrome).     Marland Kitchen FLUoxetine HCl 60 MG TABS Take 60 mg by mouth daily.    Marland Kitchen lithium carbonate 300 MG capsule Take 600 mg by mouth 2 (two) times daily with a meal.    . pantoprazole (PROTONIX) 20 MG tablet Take 20 mg by mouth.     No current facility-administered medications for this visit.    Previous Psychotropic Medications:  Medication Dose   continue above meds  na  Substance Abuse History in the last 12 months:none                                                                                                   Medical Consequences of Substance Abuse: none  Legal Consequences of Substance Abuse: none  Family Consequences of Substance Abuse: none  Blackouts:  Negative DT's:  Negative Withdrawal Symptoms:  Negative None  Social History: Current Place of Residence: [place] Place of Birth: not asked Family  Members: lives alone Marital Status:  Single Children: not asked  Sons: na  Daughters: na Relationships: a loner Education:  HS Soil scientist Problems/Performance: good Religious Beliefs/Practices: Christian History of Abuse: none Ship broker History:  None. Legal History: none Hobbies/Interests: not asked  Family History:   Family History  Problem Relation Age of Onset  . Depression Maternal Aunt   . Alcohol abuse Maternal Aunt   . Drug abuse Maternal Aunt   . Drug abuse Father   . Alcohol abuse Paternal Aunt   . Drug abuse Paternal Aunt   . Alcohol abuse Maternal Uncle   . Drug abuse Maternal Uncle   . Alcohol abuse Paternal Uncle   . Drug abuse Paternal Uncle     Mental Status Examination/Evaluation: Objective:  Appearance: Well Groomed  Eye Contact::  Good  Speech:  Clear and Coherent  Volume:  Normal  Mood:  euthymic  Affect:  Appropriate  Thought Process:  Coherent and Logical  Orientation:  Full (Time, Place, and Person)  Thought Content:  Negative  Suicidal Thoughts:  No  Homicidal Thoughts:  No  Judgement:  Good  Insight:  Fair  Psychomotor Activity:  Normal  Akathisia:  Negative  Handed:  Right  AIMS (if indicated):  0  Assets:  Communication Skills Desire for Improvement Financial Resources/Insurance Housing Physical Health Talents/Skills Transportation Vocational/Educational    Laboratory/X-Ray Psychological Evaluation(s)   none  none   Assessment:  Major depression, recurrent, severe without psychosis                  Treatment Plan/Recommendations:  Plan of Care: daily group therapy  Laboratory:  na  Psychotherapy: group therapy  Medications: continue current meds  Routine PRN Medications:  Yes  Consultations: none  Safety Concerns:  none  Other:      [name], MD 3/24/20163:47 PM
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
## 2014-07-12 ENCOUNTER — Other Ambulatory Visit (HOSPITAL_COMMUNITY): Payer: BLUE CROSS/BLUE SHIELD

## 2014-07-12 NOTE — Progress Notes (Signed)
    Daily Group Progress Note  Program: IOP  Group Time: 9:00-10:30  Participation Level: Active  Behavioral Response: Appropriate  Type of Therapy:  Group Therapy  Summary of Progress: Pt. Met with case manager and psychiatrist.     Group Time: 10:30-12:00  Participation Level:  Active  Behavioral Response: Appropriate  Type of Therapy: Psycho-education Group  Summary of Progress: Pt. Presented as alert and attentive, and talkative. Pt. Reported that she is developing awareness about how controlling and inflexible about her life and is learning to "let go" of things more easily.  Nancie Neas, LPC

## 2014-07-15 ENCOUNTER — Other Ambulatory Visit (HOSPITAL_COMMUNITY): Payer: BLUE CROSS/BLUE SHIELD | Admitting: Psychiatry

## 2014-07-15 DIAGNOSIS — F332 Major depressive disorder, recurrent severe without psychotic features: Secondary | ICD-10-CM

## 2014-07-15 NOTE — Progress Notes (Signed)
    Daily Group Progress Note  Program: IOP  Group Time: 9:00-10:30  Participation Level: Active  Behavioral Response: Appropriate  Type of Therapy:  Group Therapy  Summary of Progress: Pt. Reported that her mood was good over the Easter Holiday. Pt. Reported that she spent time with her family and attended bingo on Saturday night. Pt. Reported awareness of anxiety that precedes her agoraphobia. Pt. Reported that she was able to challenge herself to get out of the house and recognized that she had some nausea that she believes was caused by her anxiety but she was able to manage it.     Group Time: 10:30-12:00  Participation Level:  Active  Behavioral Response: Appropriate  Type of Therapy: Psycho-education Group  Summary of Progress: Pt. Watched and discussed Lorina Rabon video about transforming frustrations into opportunities for creativity and resourcefulness.   Nancie Neas, LPC

## 2014-07-16 ENCOUNTER — Other Ambulatory Visit (HOSPITAL_COMMUNITY): Payer: BLUE CROSS/BLUE SHIELD | Admitting: Psychiatry

## 2014-07-16 DIAGNOSIS — F332 Major depressive disorder, recurrent severe without psychotic features: Secondary | ICD-10-CM | POA: Diagnosis not present

## 2014-07-16 NOTE — Progress Notes (Signed)
    Daily Group Progress Note  Program: IOP  Group Time: 9:00-10:30  Participation Level: Active  Behavioral Response: Appropriate  Type of Therapy:  Group Therapy  Summary of Progress: Pt. Presented with brightened affect, smiled and talked appropriately, made appropriate eye contact. Pt. Reported that she made progress with doing laundry yesterday. Pt. Reported that she did not finish the task, but was happy that she was motivated to get started. Pt. Discussed that she believes that her "agoraphobia is no longer working for her", is developing ability to be assertive and set healthier boundaries in relationships.      Group Time: 10:30-12:00  Participation Level:  Active  Behavioral Response: Appropriate  Type of Therapy: Psycho-education Group  Summary of Progress: Pt. Watched Shawn Achor video and participated in discussion about activities that encourage a positive mental state such as gratitude, journaling, exercise, meditation and engaging in random acts of kindness.   Nancie Neas, LPC

## 2014-07-17 ENCOUNTER — Other Ambulatory Visit (HOSPITAL_COMMUNITY): Payer: BLUE CROSS/BLUE SHIELD

## 2014-07-18 ENCOUNTER — Other Ambulatory Visit (HOSPITAL_COMMUNITY): Payer: BLUE CROSS/BLUE SHIELD | Admitting: Psychiatry

## 2014-07-18 DIAGNOSIS — F332 Major depressive disorder, recurrent severe without psychotic features: Secondary | ICD-10-CM | POA: Diagnosis not present

## 2014-07-18 NOTE — Progress Notes (Signed)
    Daily Group Progress Note  Program: IOP  Group Time: 9:00-10:30  Participation Level: Active  Behavioral Response: Appropriate  Type of Therapy:  Group Therapy  Summary of Progress: Pt. Presented with brightened affect. Pt. Discussed burden of feeling dependent on others. Pt. Reported that she is getting her hair done today as an exercise of self-care.      Group Time: 10:30-12:00  Participation Level:  Active  Behavioral Response: Appropriate  Type of Therapy: Group Therapy  Summary of Progress: Pt. Participated in discussion about self-identity development, development of new interests, and defining new-healthier boundaries in relationships.   Nancie Neas, LPC

## 2014-07-19 ENCOUNTER — Other Ambulatory Visit (HOSPITAL_COMMUNITY): Payer: BLUE CROSS/BLUE SHIELD | Attending: Psychiatry | Admitting: Psychiatry

## 2014-07-19 DIAGNOSIS — F332 Major depressive disorder, recurrent severe without psychotic features: Secondary | ICD-10-CM

## 2014-07-19 DIAGNOSIS — F331 Major depressive disorder, recurrent, moderate: Secondary | ICD-10-CM | POA: Diagnosis present

## 2014-07-19 DIAGNOSIS — F419 Anxiety disorder, unspecified: Secondary | ICD-10-CM | POA: Diagnosis not present

## 2014-07-19 NOTE — Progress Notes (Signed)
    Daily Group Progress Note  Program: IOP  Group Time: 1610-9604  Participation Level: Active  Behavioral Response: Appropriate and Sharing  Type of Therapy:  Group Therapy  Summary of Progress: Pt shared that she is struggling with shame and guilt feelings after her suicide attempt.  Reports that her family is smothering her with telling her that she (pt) is loved and how they don't want her to attempt again.  "It's to the point I don't know what to say to them.  I thought fifty pills would've done it.  I don't want to see them hurt."  Suggested that pt discuss with her therapist the possibility of having a family session to educate the family and give pt a chance to openly talk.     Group Time: 1045-1200  Participation Level:  Active  Behavioral Response: Appropriate and Sharing  Type of Therapy: Psycho-education Group  Summary of Progress: Pt participated in grief/loss group; which was facilitated by Jeanella Craze.  Dellia Nims, M.Ed.

## 2014-07-22 ENCOUNTER — Telehealth (HOSPITAL_COMMUNITY): Payer: Self-pay | Admitting: Psychiatry

## 2014-07-22 ENCOUNTER — Other Ambulatory Visit (HOSPITAL_COMMUNITY): Payer: BLUE CROSS/BLUE SHIELD | Admitting: Psychiatry

## 2014-07-23 ENCOUNTER — Other Ambulatory Visit (HOSPITAL_COMMUNITY): Payer: BLUE CROSS/BLUE SHIELD | Admitting: Psychiatry

## 2014-07-23 DIAGNOSIS — F331 Major depressive disorder, recurrent, moderate: Secondary | ICD-10-CM | POA: Diagnosis not present

## 2014-07-23 DIAGNOSIS — F332 Major depressive disorder, recurrent severe without psychotic features: Secondary | ICD-10-CM

## 2014-07-23 NOTE — Progress Notes (Signed)
    Daily Group Progress Note  Program: IOP  Group Time:   Participation Level:   Behavioral Response:   Type of Therapy:    Summary of Progress:      Group Time: 1100-1200   Participation Level:  Active  Behavioral Response: Appropriate, Sharing and Drowsy  Type of Therapy: Psycho-education Group  Summary of Progress: Discussed +/- nightly rituals and ways to enhance sleep by way of deep breathing techniques, meditation, progressive muscle relaxation and guided imagery.

## 2014-07-24 ENCOUNTER — Other Ambulatory Visit (HOSPITAL_COMMUNITY): Payer: BLUE CROSS/BLUE SHIELD | Admitting: Psychiatry

## 2014-07-24 DIAGNOSIS — F331 Major depressive disorder, recurrent, moderate: Secondary | ICD-10-CM | POA: Diagnosis not present

## 2014-07-24 DIAGNOSIS — F332 Major depressive disorder, recurrent severe without psychotic features: Secondary | ICD-10-CM

## 2014-07-24 NOTE — Progress Notes (Signed)
    Daily Group Progress Note  Program: IOP  Group Time: 9:00-10:30  Participation Level: Active  Behavioral Response: Appropriate  Type of Therapy:  Group Therapy  Summary of Progress: Pt. Reported that she slept approximately 7 hours last night which is significant improvement for her. Pt. Reported plans to go to home improvement store to purchase a toilet seat with her daughter. Pt. Made plans for going to a non-threatening store, but reported that she felt that she was up to the challenge. Pt. Presented with bright affect, good eye contact, and talkative.        Nancie Neas, LPC

## 2014-07-24 NOTE — Progress Notes (Signed)
    Daily Group Progress Note  Program: IOP  Group Time: 9:00-10:30  Participation Level: Active  Behavioral Response: Appropriate  Type of Therapy:  Group Therapy  Summary of Progress: Pt. Reported that she felt very relaxed. Pt. Reported that this was her second night sleeping for 7 hours uninterrupted. Pt. Reported that she was able to go to [employer] with her daughter and pick out a toilet seat with minimal anxiety.      Group Time: 10:30-12:00  Participation Level:  Active  Behavioral Response: Appropriate  Type of Therapy: Psycho-education Group  Summary of Progress: Pt. Participated in grounding sequence including bumble bee breath, 4-3-8 breathing, tree pose, and demonstrations of legs up the wall, corpse pose, and child's pose.  Nancie Neas, LPC

## 2014-07-25 ENCOUNTER — Other Ambulatory Visit (HOSPITAL_COMMUNITY): Payer: BLUE CROSS/BLUE SHIELD | Admitting: Psychiatry

## 2014-07-25 DIAGNOSIS — F331 Major depressive disorder, recurrent, moderate: Secondary | ICD-10-CM | POA: Diagnosis not present

## 2014-07-25 DIAGNOSIS — F332 Major depressive disorder, recurrent severe without psychotic features: Secondary | ICD-10-CM

## 2014-07-25 NOTE — Progress Notes (Signed)
    Daily Group Progress Note  Program: IOP  Group Time: 9:00-10:30  Participation Level: Active  Behavioral Response: Appropriate  Type of Therapy:  Group Therapy  Summary of Progress: Pt. Reported that she was feeling sleepy, but that she was continuing to manage her anxiety well. Pt. Reported that she had more motivation for household tasks, continuing to cleanup her house. Pt. Reported that she had productive conversation with relative which helped her to gain insight about unmet needs from close family members.      Group Time: 10:30-12:00  Participation Level:  Active  Behavioral Response: Appropriate  Type of Therapy: Psycho-education Group  Summary of Progress: Pt. Watched Ruby Wax video and participated in discussion about the stigma of mental illness.   Nancie Neas, LPC

## 2014-07-26 ENCOUNTER — Other Ambulatory Visit (HOSPITAL_COMMUNITY): Payer: BLUE CROSS/BLUE SHIELD

## 2014-07-29 ENCOUNTER — Other Ambulatory Visit (HOSPITAL_COMMUNITY): Payer: BLUE CROSS/BLUE SHIELD | Admitting: Psychiatry

## 2014-07-30 ENCOUNTER — Other Ambulatory Visit (HOSPITAL_COMMUNITY): Payer: BLUE CROSS/BLUE SHIELD | Admitting: Psychiatry

## 2014-07-30 DIAGNOSIS — F331 Major depressive disorder, recurrent, moderate: Secondary | ICD-10-CM | POA: Diagnosis not present

## 2014-07-30 DIAGNOSIS — F332 Major depressive disorder, recurrent severe without psychotic features: Secondary | ICD-10-CM

## 2014-07-31 ENCOUNTER — Other Ambulatory Visit (HOSPITAL_COMMUNITY): Payer: BLUE CROSS/BLUE SHIELD | Admitting: Psychiatry

## 2014-07-31 DIAGNOSIS — F331 Major depressive disorder, recurrent, moderate: Secondary | ICD-10-CM | POA: Diagnosis not present

## 2014-07-31 DIAGNOSIS — F332 Major depressive disorder, recurrent severe without psychotic features: Secondary | ICD-10-CM

## 2014-07-31 NOTE — Progress Notes (Signed)
Patient ID: [name], female   DOB: Apr 03, 1966, 49 y.o.   MRN: [id number] Discharge Note  Patient:  Patricia Rodriguez is an 49 y.o., female DOB:  [birth year]  Date of Admission:  07/11/2014  Date of Discharge:  07/31/2014  Reason for Admission:follow up to inpatient admission after overdose  IOP Course:  Patricia Rodriguez believed she had made a psychological turn around after this inpatient stay and that more optimistic stance has persisted through out her stay in IOP.  She missed very little time and participated in groups.  She remains positive and although she is anxious about returning to work, she plans to go back on 15 April.  She has been able to get out the house and lead a fairly normal life.  Anxiety remains prominent.  Depression is present but not severe and she is not having the thoughts she might overdose for no reason.  She, even when depressed, is always pleasant and non complaining.  Mental Status at [place] depressed but not suicidal.  Anxiety is prominent. No suicidal ideation.  No psychosis  Lab Results: No results found for this or any previous visit (from the past 48 hour(s)).   Current outpatient prescriptions:  .  ALPRAZolam (XANAX) 1 MG tablet, Take 1 mg by mouth 3 (three) times daily., Disp: , Rfl:  .  buPROPion (WELLBUTRIN XL) 150 MG 24 hr tablet, Take 150 mg by mouth daily. , Disp: , Rfl:  .  ciprofloxacin (CIPRO) 500 MG tablet, Take 1 tablet (500 mg total) by mouth 2 (two) times daily., Disp: 6 tablet, Rfl: 0 .  lurasidone (LATUDA) 40 MG TABS tablet, Take by mouth 1 day or 1 dose. Take 60 mgQHS, Disp: , Rfl:  .  pantoprazole (PROTONIX) 20 MG tablet, Take 20 mg by mouth., Disp: , Rfl:   Axis Diagnosis:  Major depression, recurrent, moderate   Level of Care:  IOP  Discharge destination:  Other:  return to outpatient therapy and med management at [facility]  Is patient on multiple antipsychotic therapies at discharge:  No    Has Patient  had three or more failed trials of antipsychotic monotherapy by history:  Negative  Patient phone:  [phone] (home)  Patient address:   [address] [place] [address],   Follow-up recommendations:  Activity:  continue current activity Diet:  continue current diet  Comments:  Has done very well this go round and is expecting to return to work i 2 days  The patient received suicide prevention pamphlet:  Yes Belongings returned:  Trisha Mangle 07/31/2014, 9:47 AM

## 2014-07-31 NOTE — Progress Notes (Signed)
    Daily Group Progress Note  Program: IOP  Group Time: 9:00-10:30  Participation Level: Active  Behavioral Response: Appropriate  Type of Therapy:  Group Therapy  Summary of Progress: Pt. Prepared for discharge. Pt. Reported that she felt some nervousness about plan to return to work on Friday, but planned to return by contacting her supervisor and easing in to work schedule.     Group Time: 10:30-12:00  Participation Level:  Active  Behavioral Response: Appropriate  Type of Therapy: Psycho-education Group  Summary of Progress: Pt. Watched Mel Robbins video about developing motivation.  Nancie Neas, LPC

## 2014-07-31 NOTE — Progress Notes (Signed)
Patricia Rodriguez is a 49 y.o. patient, divorced, African American, female who was transitioned from [facility]. Pt was at that facility from 07-03-14 until 07-10-14; post suicide attempt (overdose: #50 Xanax). Pt reported worsening depressive and anxiety symtoms. Discussed safety options at length. Pt was able to contract for safety. Denied HI or A/V hallucinations. Pt reported two previous suicide attempts (overdoses) in 2005 and 2006, in which she was admitted on the inpatient unit at [facility]. Was previously hospitalized at [facility] for ~ seven days in October 2015. Family Hx: Maternal aunt struggles with depression and their are other maternal aunts and uncles with drugs and ETOH addictions. Stressors: 1) Pt mentioned that her symptoms worsened recently whenever she saw a cousin's husband at a fish fry. Reported this is someone who she has had conflict with previously. Stated she later found out that he killed two people that night. 2) Job ([employer]) of nine years. Pt stated she has been out of work on disability since January 2016 was due back to return on 07-05-14. Stated her claim was turned down and she hasn't received a paycheck in months. "My mother has been providing me with food, but I am use to taking care of myself."  Pt completed MH-IOP today.  Reports feeling much better (ie. Improved sleep and concentration).  C/O increased appetite.  Reports feeling anxious about returning to work and leaving [place].  Denies SI/HI or A/V hallucinations.  A:  D/C pt today.  F/U with [name], NP today at 2 pm and [name], [facility] on 08-14-14 @ 2 pm.  Encouraged support groups.  RTW on 08-02-14; without any restrictions.  R:  Pt receptive.

## 2014-07-31 NOTE — Progress Notes (Signed)
    Daily Group Progress Note  Program: IOP  Group Time: 9:00-10:30  Participation Level: Active  Behavioral Response: Appropriate  Type of Therapy:  Group Therapy  Summary of Progress: Pt. Presented as sleepy, lethargic. Pt. Reported that she took too much of anxiety medication before group. Pt. Participated in bilateral tapping exercise for anxiety.      Group Time: 10:30-12:00  Participation Level:  Active  Behavioral Response: Appropriate  Type of Therapy: Psycho-education Group  Summary of Progress: Pt. Participated in session with Alyse from the mental health association.   Nancie Neas, LPC

## 2014-07-31 NOTE — Patient Instructions (Signed)
Patient completed MH-IOP today.  Will follow up with [name], NP on 07-31-14 @ 2pm and [name], [facility] on 08-14-14 @ 2pm.  Encouraged support groups.  Return to work on 08-02-14; without any restrictions.

## 2014-08-01 ENCOUNTER — Other Ambulatory Visit (HOSPITAL_COMMUNITY): Payer: BLUE CROSS/BLUE SHIELD

## 2014-08-02 ENCOUNTER — Other Ambulatory Visit (HOSPITAL_COMMUNITY): Payer: BLUE CROSS/BLUE SHIELD

## 2014-08-05 ENCOUNTER — Other Ambulatory Visit (HOSPITAL_COMMUNITY): Payer: BLUE CROSS/BLUE SHIELD

## 2014-08-06 ENCOUNTER — Other Ambulatory Visit (HOSPITAL_COMMUNITY): Payer: BLUE CROSS/BLUE SHIELD

## 2014-08-07 ENCOUNTER — Other Ambulatory Visit (HOSPITAL_COMMUNITY): Payer: BLUE CROSS/BLUE SHIELD

## 2014-08-08 ENCOUNTER — Other Ambulatory Visit (HOSPITAL_COMMUNITY): Payer: BLUE CROSS/BLUE SHIELD

## 2014-08-09 ENCOUNTER — Other Ambulatory Visit (HOSPITAL_COMMUNITY): Payer: BLUE CROSS/BLUE SHIELD

## 2014-08-12 ENCOUNTER — Other Ambulatory Visit (HOSPITAL_COMMUNITY): Payer: BLUE CROSS/BLUE SHIELD

## 2014-08-13 ENCOUNTER — Other Ambulatory Visit (HOSPITAL_COMMUNITY): Payer: BLUE CROSS/BLUE SHIELD

## 2014-08-14 ENCOUNTER — Other Ambulatory Visit (HOSPITAL_COMMUNITY): Payer: BLUE CROSS/BLUE SHIELD

## 2014-08-15 ENCOUNTER — Other Ambulatory Visit (HOSPITAL_COMMUNITY): Payer: BLUE CROSS/BLUE SHIELD

## 2014-08-16 ENCOUNTER — Other Ambulatory Visit (HOSPITAL_COMMUNITY): Payer: BLUE CROSS/BLUE SHIELD

## 2014-08-19 ENCOUNTER — Other Ambulatory Visit (HOSPITAL_COMMUNITY): Payer: BLUE CROSS/BLUE SHIELD

## 2014-08-20 ENCOUNTER — Other Ambulatory Visit (HOSPITAL_COMMUNITY): Payer: BLUE CROSS/BLUE SHIELD

## 2014-08-21 ENCOUNTER — Other Ambulatory Visit (HOSPITAL_COMMUNITY): Payer: BLUE CROSS/BLUE SHIELD

## 2014-09-25 ENCOUNTER — Encounter (HOSPITAL_COMMUNITY): Payer: Self-pay | Admitting: Psychiatry

## 2014-09-30 ENCOUNTER — Encounter: Payer: Self-pay | Admitting: Podiatry

## 2014-09-30 ENCOUNTER — Ambulatory Visit (INDEPENDENT_AMBULATORY_CARE_PROVIDER_SITE_OTHER): Payer: BLUE CROSS/BLUE SHIELD | Admitting: Podiatry

## 2014-09-30 VITALS — BP 148/84 | HR 86 | Temp 98.3°F | Resp 14

## 2014-09-30 DIAGNOSIS — L600 Ingrowing nail: Secondary | ICD-10-CM | POA: Diagnosis not present

## 2014-09-30 DIAGNOSIS — B351 Tinea unguium: Secondary | ICD-10-CM | POA: Diagnosis not present

## 2014-09-30 DIAGNOSIS — M79606 Pain in leg, unspecified: Secondary | ICD-10-CM

## 2014-09-30 NOTE — Progress Notes (Signed)
   Subjective:    Patient ID: Patricia Rodriguez, female    DOB: 29-Jan-1966, 49 y.o.   MRN: [id number]  HPI Patient presents here today with painful and discolored B/L toenails since 6 months ago. She has had the blackened nails for 5 years. They are too thick to cut herself.     Review of Systems  Skin:       excema  Neurological: Positive for headaches.  Psychiatric/Behavioral:       Depression, anxiety       Objective:   Physical Exam        Assessment & Plan:

## 2014-09-30 NOTE — Progress Notes (Signed)
Subjective:     Patient ID: Patricia Rodriguez, female   DOB: [birth year], 49 y.o.   MRN: [id number]  HPI patient presents with severe nail disease of the hallux second and fifth nail right and hallux second and lesser nails of the left. States they get tender and making it hard for her to walk or to wear shoe gear comfortably   Review of Systems  All other systems reviewed and are negative.      Objective:   Physical Exam  Constitutional: She is oriented to person, place, and time.  Cardiovascular: Intact distal pulses.   Musculoskeletal: Normal range of motion.  Neurological: She is oriented to person, place, and time.  Skin: Skin is warm.  Nursing note and vitals reviewed.  neurovascular status intact muscle strength adequate with range of motion found to be within normal limits. 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 Long-term I recommended nail removal and explained procedure and patient will have the right foot done first with hallux second and fifth nails to be removed permanently scheduled for surgery

## 2014-10-07 ENCOUNTER — Ambulatory Visit: Payer: BLUE CROSS/BLUE SHIELD | Admitting: Podiatry

## 2014-10-10 ENCOUNTER — Ambulatory Visit: Payer: BLUE CROSS/BLUE SHIELD | Admitting: Podiatry

## 2014-10-14 ENCOUNTER — Encounter: Payer: Self-pay | Admitting: Podiatry

## 2014-10-14 ENCOUNTER — Ambulatory Visit (INDEPENDENT_AMBULATORY_CARE_PROVIDER_SITE_OTHER): Payer: BLUE CROSS/BLUE SHIELD | Admitting: Podiatry

## 2014-10-14 VITALS — BP 143/90 | HR 96 | Resp 16

## 2014-10-14 DIAGNOSIS — L600 Ingrowing nail: Secondary | ICD-10-CM

## 2014-10-14 MED ORDER — OXYCODONE-ACETAMINOPHEN 10-325 MG PO TABS: 1.0000 | ORAL_TABLET | ORAL | Status: DC | PRN

## 2014-10-14 NOTE — Progress Notes (Signed)
Subjective:     Patient ID: Patricia Rodriguez, female   DOB: [birth year], 49 y.o.   MRN: [id number]  HPI patient presents for nail removal of the big nail and second nail left foot decided she doesn't want the fifth nail done. Has several nails on the right foot that are given need to be fixed but were in a do the left foot first. States they are tender   Review of Systems     Objective:   Physical Exam Neurovascular status intact muscle strength adequate range of motion within normal limits with significant thickness of the hallux and second nails left that are painful when pressed hallux second fifth nails of the right foot that are painful when pressed    Assessment:     Damaged hallux nailbeds with pain 1 to left 1-5 right    Plan:     H&P and condition reviewed with patient. We discussed nail removal and I explained risk and patient wants the procedures to be performed at this time I went ahead and I infiltrated the left hallux and second toes with 120 mg Xylocaine Marcaine mixture and remove the hallux and second nails exposed matrix and applied phenol 5 applications to the hallux nailbed for applications to the second nail bed and applied alcohol lavaged afterwards and sterile dressings. Gave instructions on soaks and reappoint

## 2014-10-14 NOTE — Patient Instructions (Signed)

## 2014-10-15 ENCOUNTER — Telehealth: Payer: Self-pay | Admitting: *Deleted

## 2014-10-15 NOTE — Telephone Encounter (Signed)
Called patient at [phone] (Home #) to check to see how their left great toe was doing. Patient stated that their left great toe bleed last night. They changed their dressing and foot is feeling okay today.

## 2014-10-29 ENCOUNTER — Ambulatory Visit: Payer: BLUE CROSS/BLUE SHIELD | Admitting: Podiatry

## 2014-10-30 ENCOUNTER — Telehealth: Payer: Self-pay | Admitting: *Deleted

## 2014-10-30 MED ORDER — OXYCODONE-ACETAMINOPHEN 10-325 MG PO TABS: 1.0000 | ORAL_TABLET | ORAL | Status: DC | PRN

## 2014-10-30 NOTE — Telephone Encounter (Signed)
Pt states she will be in to see Dr. Paulla Dolly 11/20/2014, but her foot hurts and she would like a refill of the Percocet.  Dr. Paulla Dolly ordered refill as previously, but no more until seen.  Pt is informed she must pick the rx up in the [place] office.

## 2014-11-20 ENCOUNTER — Encounter: Payer: Self-pay | Admitting: Podiatry

## 2014-11-20 ENCOUNTER — Ambulatory Visit (INDEPENDENT_AMBULATORY_CARE_PROVIDER_SITE_OTHER): Payer: BLUE CROSS/BLUE SHIELD | Admitting: Podiatry

## 2014-11-20 VITALS — BP 140/96 | HR 86 | Resp 15

## 2014-11-20 DIAGNOSIS — L600 Ingrowing nail: Secondary | ICD-10-CM | POA: Diagnosis not present

## 2014-11-20 MED ORDER — NEOMYCIN-POLYMYXIN-HC 3.5-10000-1 OT SOLN: OTIC | Status: DC

## 2014-11-20 MED ORDER — OXYCODONE-ACETAMINOPHEN 10-325 MG PO TABS: 1.0000 | ORAL_TABLET | ORAL | Status: DC | PRN

## 2014-11-20 NOTE — Progress Notes (Signed)
Subjective:     Patient ID: Patricia Rodriguez, female   DOB: Sep 25, 1965, 49 y.o.   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 Patient wants surgery understanding risk and today I went ahead and infiltrated the right second toe and big toe with 120 mg Xylocaine Marcaine mixture and under sterile conditions remove the hallux nail and second nail right. I then exposed matrix and applied phenol 5 applications to the hallux for applications the second toe 30 seconds followed by alcohol lavaged and sterile dressing. Gave instructions on soaks

## 2014-11-20 NOTE — Patient Instructions (Signed)

## 2014-11-26 ENCOUNTER — Telehealth: Payer: Self-pay | Admitting: *Deleted

## 2014-11-26 NOTE — Telephone Encounter (Addendum)
Pt request refill of Percocet her toenail procedure sites still hurt.  Reordered and informed pt, she would need to be seen if continues to have pain, and to pick the rx up in the [place] office.

## 2014-11-27 MED ORDER — OXYCODONE-ACETAMINOPHEN 10-325 MG PO TABS: 1.0000 | ORAL_TABLET | ORAL | Status: DC | PRN

## 2014-11-27 NOTE — Telephone Encounter (Signed)
She can have 20 and that is it

## 2014-12-03 ENCOUNTER — Ambulatory Visit (INDEPENDENT_AMBULATORY_CARE_PROVIDER_SITE_OTHER): Payer: BLUE CROSS/BLUE SHIELD | Admitting: Family Medicine

## 2014-12-03 VITALS — BP 138/80 | HR 113 | Temp 98.7°F | Resp 16 | Ht 60.0 in | Wt 257.2 lb

## 2014-12-03 DIAGNOSIS — J029 Acute pharyngitis, unspecified: Secondary | ICD-10-CM

## 2014-12-03 DIAGNOSIS — J020 Streptococcal pharyngitis: Secondary | ICD-10-CM | POA: Diagnosis not present

## 2014-12-03 LAB — POCT RAPID STREP A (OFFICE): RAPID STREP A SCREEN: NEGATIVE

## 2014-12-03 MED ORDER — PENICILLIN V POTASSIUM 500 MG PO TABS: ORAL_TABLET | ORAL | Status: DC

## 2014-12-03 NOTE — Patient Instructions (Signed)
Drink plenty of fluids and get enough rest  Take ibuprofen or Tylenol if needed for pain and fever  Take the penicillin 500 mg 1 twice daily for infection. If the throat culture shows positive for strep take it for a full 10 days. If it is negative you should discontinue the penicillin and discard the remainder  Should hear from my office by this weekend regarding this, if you do not hear back by about Friday or Saturday please call and check on the lab results.  Return if further problems \ Pharyngitis Pharyngitis is redness, pain, and swelling (inflammation) of your pharynx.  CAUSES  Pharyngitis is usually caused by infection. Most of the time, these infections are from viruses (viral) and are part of a cold. However, sometimes pharyngitis is caused by bacteria (bacterial). Pharyngitis can also be caused by allergies. Viral pharyngitis may be spread from person to person by coughing, sneezing, and personal items or utensils (cups, forks, spoons, toothbrushes). Bacterial pharyngitis may be spread from person to person by more intimate contact, such as kissing.  SIGNS AND SYMPTOMS  Symptoms of pharyngitis include:   Sore throat.   Tiredness (fatigue).   Low-grade fever.   Headache.  Joint pain and muscle aches.  Skin rashes.  Swollen lymph nodes.  Plaque-like film on throat or tonsils (often seen with bacterial pharyngitis). DIAGNOSIS  Your health care provider will ask you questions about your illness and your symptoms. Your medical history, along with a physical exam, is often all that is needed to diagnose pharyngitis. Sometimes, a rapid strep test is done. Other lab tests may also be done, depending on the suspected cause.  TREATMENT  Viral pharyngitis will usually get better in 3-4 days without the use of medicine. Bacterial pharyngitis is treated with medicines that kill germs (antibiotics).  HOME CARE INSTRUCTIONS   Drink enough water and fluids to keep your urine  clear or pale yellow.   Only take over-the-counter or prescription medicines as directed by your health care provider:   If you are prescribed antibiotics, make sure you finish them even if you start to feel better.   Do not take aspirin.   Get lots of rest.   Gargle with 8 oz of salt water ( tsp of salt per 1 qt of water) as often as every 1-2 hours to soothe your throat.   Throat lozenges (if you are not at risk for choking) or sprays may be used to soothe your throat. SEEK MEDICAL CARE IF:   You have large, tender lumps in your neck.  You have a rash.  You cough up green, yellow-brown, or bloody spit. SEEK IMMEDIATE MEDICAL CARE IF:   Your neck becomes stiff.  You drool or are unable to swallow liquids.  You vomit or are unable to keep medicines or liquids down.  You have severe pain that does not go away with the use of recommended medicines.  You have trouble breathing (not caused by a stuffy nose). MAKE SURE YOU:   Understand these instructions.  Will watch your condition.  Will get help right away if you are not doing well or get worse. Document Released: 04/05/2005 Document Revised: 01/24/2013 Document Reviewed: 12/11/2012 [facility] Patient Information 2015 [place], [place]. This information is not intended to replace advice given to you by your health care provider. Make sure you discuss any questions you have with your health care provider.

## 2014-12-03 NOTE — Progress Notes (Signed)
  Subjective:  Patient ID: Patricia Rodriguez, female    DOB: September 23, 1965  Age: 49 y.o. MRN: [id number]  49 year old lady who comes in with a history of a sore throat since last week. The last few days she has had some fever. She did a little bit better with taking some BC and other over-the-counter pain relievers. However this morning she woke up with it hurting badly again. She felt feverish. She took some BC before she came in here. She is on disability. She does not smoke. She has had pain into her ears. She has a little cough, some head stuffiness but not blowing much out the nose. She has not been exposed to anyone else with these kind of symptoms that she knows of. She did have a lot of strep throats when she was young, but it is been a long time since she's had anything like this.   Objective:   Pleasant lady. Alert and oriented. She is significantly overweight. Her TMs are normal. Throat erythematous with white exudate on the tonsils and back the throat. Neck supple without major nodes. Chest is clear to auscultation. Heart regular without murmurs.  Assessment & Plan:   Assessment:  Acute sore throat  Plan:  Strep test. If negative will do a culture.  Results for orders placed or performed in visit on 12/03/14  POCT rapid strep A  Result Value Ref Range   Rapid Strep A Screen Negative Negative    The exam is suspicious enough that I'm going ahead and treating until we get the culture results back. If they are negative she is to stop the medicine. There are no Patient Instructions on file for this visit.   Aubrielle Stroud, MD 12/03/2014

## 2014-12-05 ENCOUNTER — Telehealth: Payer: Self-pay

## 2014-12-05 NOTE — Telephone Encounter (Signed)
Pt called about labs. Let her know they were still pending and that we would call her when they came back

## 2014-12-06 LAB — CULTURE, GROUP A STREP: Organism ID, Bacteria: NORMAL

## 2014-12-08 ENCOUNTER — Encounter: Payer: Self-pay | Admitting: Family Medicine

## 2014-12-11 ENCOUNTER — Ambulatory Visit (INDEPENDENT_AMBULATORY_CARE_PROVIDER_SITE_OTHER): Payer: BLUE CROSS/BLUE SHIELD | Admitting: Physician Assistant

## 2014-12-11 VITALS — BP 130/88 | HR 107 | Temp 98.6°F | Resp 20 | Ht 60.25 in | Wt 259.4 lb

## 2014-12-11 DIAGNOSIS — Z880 Allergy status to penicillin: Secondary | ICD-10-CM | POA: Diagnosis not present

## 2014-12-11 DIAGNOSIS — L500 Allergic urticaria: Secondary | ICD-10-CM

## 2014-12-11 MED ORDER — PREDNISONE 20 MG PO TABS: 40.0000 mg | ORAL_TABLET | Freq: Every day | ORAL | Status: DC

## 2014-12-11 NOTE — Progress Notes (Signed)
   Subjective:    Patient ID: Patricia Rodriguez, female    DOB: 19-Jan-1966, 49 y.o.   MRN: [id number]  Chief Complaint  Patient presents with  . Rash    Located all over body. Started Sunday night.    . Sore Throat    x1 week. Was given PCN last week and noticed the itching after starting the PCN.    Medications, allergies, past medical history, surgical history, family history, social history and problem list reviewed and updated.  HPI  72 yof presents with rash.  Seen here 8 days ago put on pcn for st. Throat cultures so far negative. Had been pcn bid. Approx 3 days ago noticed hives back of neck. Since then have moved to chest, back, bilateral arms and left leg. No known hx pcn allergy.  Denies other new meds, foods, lotions, detergents, travel, bedding, soaps or anything else new. Denies sob, cough, trouble breathing, trouble swallowing. Denies fevers.   Review of Systems See HPI     Objective:   Physical Exam  Constitutional: She appears well-developed and well-nourished.  Non-toxic appearance. She does not have a sickly appearance. She does not appear ill. No distress.  BP 130/88 mmHg  Pulse 107  Temp(Src) 98.6 F (37 C) (Oral)  Resp 20  Ht 5' 0.25" (1.53 m)  Wt 259 lb 6.4 oz (117.663 kg)  BMI 50.26 kg/m2  SpO2 96%   HENT:  Mouth/Throat: Uvula is midline, oropharynx is clear and moist and mucous membranes are normal.  Pulmonary/Chest: Effort normal and breath sounds normal.  Skin:  Small hives in between breasts and left anterior elbow. One large hive lower mid back.       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

## 2014-12-11 NOTE — Patient Instructions (Signed)
Take the prednisone 40 mg daily for 3 days. Be sure to not take penicillins any longer and inform all your health care providers of this.  I have added it to your allergy list.  You can take allegra or claritin in the am and benadryl at night to help with itching.  Please come back to see [place] if you're not improved in 4-5 days.   Hives Hives are itchy, red, swollen areas of the skin. They can vary in size and location on your body. Hives can come and go for hours or several days (acute hives) or for several weeks (chronic hives). Hives do not spread from person to person (noncontagious). They may get worse with scratching, exercise, and emotional stress. CAUSES   Allergic reaction to food, additives, or drugs.  Infections, including the common cold.  Illness, such as vasculitis, lupus, or thyroid disease.  Exposure to sunlight, heat, or cold.  Exercise.  Stress.  Contact with chemicals. SYMPTOMS   Red or white swollen patches on the skin. The patches may change size, shape, and location quickly and repeatedly.  Itching.  Swelling of the hands, feet, and face. This may occur if hives develop deeper in the skin. DIAGNOSIS  Your caregiver can usually tell what is wrong by performing a physical exam. Skin or blood tests may also be done to determine the cause of your hives. In some cases, the cause cannot be determined. TREATMENT  Mild cases usually get better with medicines such as antihistamines. Severe cases may require an emergency epinephrine injection. If the cause of your hives is known, treatment includes avoiding that trigger.  HOME CARE INSTRUCTIONS   Avoid causes that trigger your hives.  Take antihistamines as directed by your caregiver to reduce the severity of your hives. Non-sedating or low-sedating antihistamines are usually recommended. Do not drive while taking an antihistamine.  Take any other medicines prescribed for itching as directed by your  caregiver.  Wear loose-fitting clothing.  Keep all follow-up appointments as directed by your caregiver. SEEK MEDICAL CARE IF:   You have persistent or severe itching that is not relieved with medicine.  You have painful or swollen joints. SEEK IMMEDIATE MEDICAL CARE IF:   You have a fever.  Your tongue or lips are swollen.  You have trouble breathing or swallowing.  You feel tightness in the throat or chest.  You have abdominal pain. These problems may be the first sign of a life-threatening allergic reaction. Call your local emergency services (911 in [place] MAKE SURE YOU:   Understand these instructions.  Will watch your condition.  Will get help right away if you are not doing well or get worse. Document Released: 04/05/2005 Document Revised: 04/10/2013 Document Reviewed: 06/29/2011 [facility] Patient Information 2015 [place], [place]. This information is not intended to replace advice given to you by your health care provider. Make sure you discuss any questions you have with your health care provider.

## 2015-03-28 ENCOUNTER — Ambulatory Visit (INDEPENDENT_AMBULATORY_CARE_PROVIDER_SITE_OTHER): Payer: BLUE CROSS/BLUE SHIELD

## 2015-03-28 ENCOUNTER — Ambulatory Visit (INDEPENDENT_AMBULATORY_CARE_PROVIDER_SITE_OTHER): Payer: BLUE CROSS/BLUE SHIELD | Admitting: Family Medicine

## 2015-03-28 VITALS — BP 136/94 | HR 115 | Temp 99.1°F | Resp 16 | Ht 60.0 in | Wt 275.0 lb

## 2015-03-28 DIAGNOSIS — F3130 Bipolar disorder, current episode depressed, mild or moderate severity, unspecified: Secondary | ICD-10-CM

## 2015-03-28 DIAGNOSIS — M25571 Pain in right ankle and joints of right foot: Secondary | ICD-10-CM

## 2015-03-28 DIAGNOSIS — S93401A Sprain of unspecified ligament of right ankle, initial encounter: Secondary | ICD-10-CM

## 2015-03-28 DIAGNOSIS — M79671 Pain in right foot: Secondary | ICD-10-CM

## 2015-03-28 DIAGNOSIS — F319 Bipolar disorder, unspecified: Secondary | ICD-10-CM

## 2015-03-28 MED ORDER — IBUPROFEN 200 MG PO TABS: 200.0000 mg | ORAL_TABLET | Freq: Four times a day (QID) | ORAL | Status: DC | PRN

## 2015-03-28 NOTE — Progress Notes (Signed)
Subjective:  By signing my name below, I, Rawaa Al Rifaie, attest that this documentation has been prepared under the direction and in the presence of Merri Ray, MD.  Leandra Kern, Medical Scribe. 03/28/2015.  4:16 PM.   I personally performed the services described in this documentation, which was scribed in my presence. The recorded information has been reviewed and considered, and addended by me as needed.      Patient ID: Patricia Rodriguez, female    DOB: September 26, 1965, 49 y.o.   MRN: [id number]  Chief Complaint  Patient presents with  . Ankle Injury    hurt her right ankle saturday  . Depression    triage screening    HPI HPI Comments: ADITI DAMSCHRODER is a 49 y.o. female who presents to [facility] complaining of right ankle injury, onset 6 days ago. Pt has a hx of Bipolar disorder, and SI. She was admitted to behavioral health earlier this year, discharged on 04/13. On discharge, depression was present, but no further thoughts of overdose. Pt is followed by [facility].  Depression screen [facility] 2/9 03/28/2015 12/11/2014 12/03/2014  Decreased Interest 3 0 3  Down, Depressed, Hopeless 3 0 3  PHQ - 2 Score 6 0 6  Altered sleeping 3 - 3  Tired, decreased energy 3 - 3  Change in appetite 2 - 1  Feeling bad or failure about yourself  3 - 3  Trouble concentrating 3 - 1  Moving slowly or fidgety/restless 3 - 0  Suicidal thoughts 0 - 0  PHQ-9 Score 23 - 17  Difficult doing work/chores - - Extremely dIfficult     Ankle injury:  Pt states that she twisted the area as she was walking. Pt was not able to walk on it comfortably, however she did not need to used crutches. Pt was did not go to the ED for the injury. She notes that the area had associated swelling and ecchymosis, and describes the pain as throbbing pain that prevents her from sleeping at night. Pt took Ibuprofen for the pain, however she did not try applying any heat or cold treatments.  She states that she does not have a hx of ankle fractures, but she does reports straining the area about 4 years ago. Pt notes taking BC powder extensively, and reports hx of ulcers previously.   Bipolar disorder with hx of SI: Pt notes that she is followed up by Dr. Dorethea Clan twice every week. She indicates that her depression symptoms are worse when she is experiencing physical pain. Pt states that she has had suicidal thought with making plans 3 times, however she denies current thoughts of suicide or having an active intent/ plan today.    Patient Active Problem List   Diagnosis Date Noted  . Severe recurrent major depression without psychotic features ([place]) 07/11/2014  . Hypokalemia 07/02/2014  . Overdose of benzodiazepine 07/01/2014  . Bipolar disorder ([place]) 07/01/2014  . Suicidal ideation 07/01/2014  . UTI (urinary tract infection) 07/01/2014  . ONYCHOMYCOSIS 06/16/2006  . Obesity, morbid ([place]) 06/16/2006  . INCONTINENCE, STRESS, FEMALE 06/16/2006  . DYSFUNCTIONAL UTERINE BLEEDING 06/16/2006  . ECZEMA, ATOPIC DERMATITIS 06/16/2006   Past Medical History  Diagnosis Date  . Mental disorder   . Anxiety   . Depression   . IBS (irritable bowel syndrome)   . Headache(784.0)    History reviewed. No pertinent past surgical history. Allergies  Allergen Reactions  . Penicillins Hives  . Vicodin [Hydrocodone-Acetaminophen]  Hives and Itching   Prior to Admission medications   Medication Sig Start Date End Date Taking? Authorizing Provider  ALPRAZolam Duanne Moron) 1 MG tablet Take 1 mg by mouth 3 (three) times daily.   Yes Historical Provider, MD  Armodafinil (NUVIGIL PO) Take by mouth daily.   Yes Historical Provider, MD  buPROPion (WELLBUTRIN XL) 150 MG 24 hr tablet Take 150 mg by mouth daily.    Yes Historical Provider, MD  lurasidone (LATUDA) 40 MG TABS tablet Take by mouth 1 day or 1 dose. Take 60 mgQHS   Yes Historical Provider, MD  NUVIGIL 250 MG tablet  09/30/14  Yes Historical  Provider, MD  neomycin-polymyxin-hydrocortisone (CORTISPORIN) otic solution Apply 1-2 drops to toe after soaking BID Patient not taking: Reported on 03/28/2015 11/20/14   Wallene Huh, DPM  oxyCODONE-acetaminophen (PERCOCET) 10-325 MG per tablet Take 1 tablet by mouth every 4 (four) hours as needed for pain. Patient not taking: Reported on 03/28/2015 11/27/14   Tamala Fothergill Regal, DPM  pantoprazole (PROTONIX) 20 MG tablet Take 20 mg by mouth.    Historical Provider, MD   Social History   Social History  . Marital Status: Divorced    Spouse Name: N/A  . Number of Children: N/A  . Years of Education: N/A   Occupational History  . Not on file.   Social History Main Topics  . Smoking status: Light Tobacco Smoker  . Smokeless tobacco: Never Used  . Alcohol Use: No  . Drug Use: No  . Sexual Activity: Not Currently   Other Topics Concern  . Not on file   Social History Narrative    Review of Systems  Musculoskeletal: Positive for joint swelling and arthralgias.  Psychiatric/Behavioral: Positive for dysphoric mood. Negative for suicidal ideas and self-injury.      Objective:   Physical Exam  Constitutional: She is oriented to person, place, and time. She appears well-developed and well-nourished. No distress.  HENT:  Head: Normocephalic and atraumatic.  Eyes: EOM are normal. Pupils are equal, round, and reactive to light.  Neck: Neck supple.  Cardiovascular: Normal rate.   Pulmonary/Chest: Effort normal.  Musculoskeletal:  Right knee- no tenderness to palpation including proximal fibula. Tender to palp along the distal fib to the lateral malleolus anterior and inferior.  Right ankle- Soft tissue swelling, faint depending ecchymosis over the lateral ankle on the right. Right foot- Navicula is non tender, but tender along the proximal 5th metatarsal. Slight decreased ROM diffusely, but strength intact.   Neurological: She is alert and oriented to person, place, and time. No cranial  nerve deficit.  Skin: Skin is warm and dry.  Psychiatric: She has a normal mood and affect. Her behavior is normal.  Nursing note and vitals reviewed.   Filed Vitals:   03/28/15 1545  BP: 136/94  Pulse: 115  Temp: 99.1 F (37.3 C)  TempSrc: Oral  Resp: 16  Height: 5' (1.524 m)  Weight: 275 lb (124.739 kg)  SpO2: 95%    [facility] (PRIMARY) x-ray report read by Dr. Merri Ray, MD: Right foot- No apparent fracture. Right ankle- Small tiny avulsion, possibility old. In the distal fibula there is a  lateral soft tissue swelling, no acute fractures.      Assessment & Plan:   Patricia Rodriguez is a 49 y.o. female Ankle sprain, right, initial encounter - Plan: DG Foot Complete Right, DG Ankle Complete Right, ibuprofen (ADVIL) 200 MG tablet Ankle pain, right - Plan: DG Foot Complete Right, DG  Ankle Complete Right Foot pain, right - Plan: DG Foot Complete Right, DG Ankle Complete Right  - Suspected lateral ankle sprain, no apparent acute fractures. Small area distal to lateral malleolus likely old avulsion. No concerning findings on foot x-ray.  -Lace up ankle brace as needed. Start range of motion as soon as she is able.   - HEP, RTC precautions.   Bipolar depression ([place])  - Denies recent suicidal intent, plan or active suicidal ideation. She does have appointment with her psychiatrist tomorrow. Does admit to some increased depressed symptoms with pain in her ankle. Keep follow-up with Dr. tomorrow, ER/911 precautions if active suicidal ideation, intent, or plan. Understanding expressed.  Meds ordered this encounter  Medications  . ibuprofen (ADVIL) 200 MG tablet    Sig: Take 1-2 tablets (200-400 mg total) by mouth every 6 (six) hours as needed.    Dispense:  30 tablet    Refill:  0   Patient Instructions  Ankle brace as needed, range of motion as discussed. Ibuprofen 1-2 over-the-counter up to every 6 hours as needed with food. Stop this medication if any abdominal pain. See  information on ankle sprain below recheck in the next 2 weeks if symptoms are not improved. Return to the clinic or go to the nearest emergency room if any of your symptoms worsen or new symptoms occur.  Keep appointment with your psychiatrist tomorrow.    RICE for Routine Care of Injuries Theroutine careofmanyinjuriesincludes rest, ice, compression, and elevation (RICE therapy). RICE therapy is often recommended for injuries to soft tissues, such as a muscle strain, ligament injuries, bruises, and overuse injuries. It can also be used for some bony injuries. Using RICE therapy can help to relieve pain, lessen swelling, and enable your body to heal. Rest Rest is required to allow your body to heal. This usually involves reducing your normal activities and avoiding use of the injured part of your body. Generally, you can return to your normal activities when you are comfortable and have been given permission by your health care provider. Ice Icing your injury helps to keep the swelling down, and it lessens pain. Do not apply ice directly to your skin.  Put ice in a plastic bag.  Place a towel between your skin and the bag.  Leave the ice on for 20 minutes, 2-3 times a day. Do this for as long as you are directed by your health care provider. Compression Compression means putting pressure on the injured area. Compression helps to keep swelling down, gives support, and helps with discomfort. Compression may be done with an elastic bandage. If an elastic bandage has been applied, follow these general tips:  Remove and reapply the bandage every 3-4 hours or as directed by your health care provider.  Make sure the bandage is not wrapped too tightly, because this can cut off circulation. If part of your body beyond the bandage becomes blue, numb, cold, swollen, or more painful, your bandage is most likely too tight. If this occurs, remove your bandage and reapply it more loosely.  See your  health care provider if the bandage seems to be making your problems worse rather than better. Elevation Elevation means keeping the injured area raised. This helps to lessen swelling and decrease pain. If possible, your injured area should be elevated at or above the level of your heart or the center of your chest. [place]? You should seek medical care if:  Your pain and swelling  continue.  Your symptoms are getting worse rather than improving. These symptoms may indicate that further evaluation or further X-rays are needed. Sometimes, X-rays may not show a small broken bone (fracture) until a number of days later. Make a follow-up appointment with your health care provider. WHEN SHOULD I SEEK IMMEDIATE MEDICAL CARE? You should seek immediate medical care if:  You have sudden severe pain at or below the area of your injury.  You have redness or increased swelling around your injury.  You have tingling or numbness at or below the area of your injury that does not improve after you remove the elastic bandage.   This information is not intended to replace advice given to you by your health care provider. Make sure you discuss any questions you have with your health care provider.   Document Released: 07/18/2000 Document Revised: 12/25/2014 Document Reviewed: 03/13/2014 Elsevier Interactive Patient Education 2016 [employer]  Acute Ankle Sprain With Phase I Rehab An acute ankle sprain is a partial or complete tear in one or more of the ligaments of the ankle due to traumatic injury. The severity of the injury depends on both the number of ligaments sprained and the grade of sprain. There are 3 grades of sprains.   A grade 1 sprain is a mild sprain. There is a slight pull without obvious tearing. There is no loss of strength, and the muscle and ligament are the correct length.  A grade 2 sprain is a moderate sprain. There is tearing of fibers within the substance  of the ligament where it connects two bones or two cartilages. The length of the ligament is increased, and there is usually decreased strength.  A grade 3 sprain is a complete rupture of the ligament and is uncommon. In addition to the grade of sprain, there are three types of ankle sprains.  Lateral ankle sprains: This is a sprain of one or more of the three ligaments on the outer side (lateral) of the ankle. These are the most common sprains. Medial ankle sprains: There is one large triangular ligament of the inner side (medial) of the ankle that is susceptible to injury. Medial ankle sprains are less common. Syndesmosis, "high ankle," sprains: The syndesmosis is the ligament that connects the two bones of the lower leg. Syndesmosis sprains usually only occur with very severe ankle sprains. SYMPTOMS  Pain, tenderness, and swelling in the ankle, starting at the side of injury that may progress to the whole ankle and foot with time.  "Pop" or tearing sensation at the time of injury.  Bruising that may spread to the heel.  Impaired ability to walk soon after injury. CAUSES   Acute ankle sprains are caused by trauma placed on the ankle that temporarily forces or pries the anklebone (talus) out of its normal socket.  Stretching or tearing of the ligaments that normally hold the joint in place (usually due to a twisting injury). RISK INCREASES WITH:  Previous ankle sprain.  Sports in which the foot may land awkwardly (i.e., basketball, volleyball, or soccer) or walking or running on uneven or rough surfaces.  Shoes with inadequate support to prevent sideways motion when stress occurs.  Poor strength and flexibility.  Poor balance skills.  Contact sports. PREVENTION   Warm up and stretch properly before activity.  Maintain physical fitness:  Ankle and leg flexibility, muscle strength, and endurance.  Cardiovascular fitness.  Balance training activities.  Use proper technique  and have a coach correct improper technique.  Taping, protective strapping, bracing, or high-top tennis shoes may help prevent injury. Initially, tape is best; however, it loses most of its support function within 10 to 15 minutes.  Wear proper-fitted protective shoes (High-top shoes with taping or bracing is more effective than either alone).  Provide the ankle with support during sports and practice activities for 12 months following injury. PROGNOSIS   If treated properly, ankle sprains can be expected to recover completely; however, the length of recovery depends on the degree of injury.  A grade 1 sprain usually heals enough in 5 to 7 days to allow modified activity and requires an average of 6 weeks to heal completely.  A grade 2 sprain requires 6 to 10 weeks to heal completely.  A grade 3 sprain requires 12 to 16 weeks to heal.  A syndesmosis sprain often takes more than 3 months to heal. RELATED COMPLICATIONS   Frequent recurrence of symptoms may result in a chronic problem. Appropriately addressing the problem the first time decreases the frequency of recurrence and optimizes healing time. Severity of the initial sprain does not predict the likelihood of later instability.  Injury to other structures (bone, cartilage, or tendon).  A chronically unstable or arthritic ankle joint is a possibility with repeated sprains. TREATMENT Treatment initially involves the use of ice, medication, and compression bandages to help reduce pain and inflammation. Ankle sprains are usually immobilized in a walking cast or boot to allow for healing. Crutches may be recommended to reduce pressure on the injury. After immobilization, strengthening and stretching exercises may be necessary to regain strength and a full range of motion. Surgery is rarely needed to treat ankle sprains. MEDICATION   Nonsteroidal anti-inflammatory medications, such as aspirin and ibuprofen (do not take for the first 3 days  after injury or within 7 days before surgery), or other minor pain relievers, such as acetaminophen, are often recommended. Take these as directed by your caregiver. Contact your caregiver immediately if any bleeding, stomach upset, or signs of an allergic reaction occur from these medications.  Ointments applied to the skin may be helpful.  Pain relievers may be prescribed as necessary by your caregiver. Do not take prescription pain medication for longer than 4 to 7 days. Use only as directed and only as much as you need. HEAT AND COLD  Cold treatment (icing) is used to relieve pain and reduce inflammation for acute and chronic cases. Cold should be applied for 10 to 15 minutes every 2 to 3 hours for inflammation and pain and immediately after any activity that aggravates your symptoms. Use ice packs or an ice massage.  Heat treatment may be used before performing stretching and strengthening activities prescribed by your caregiver. Use a heat pack or a warm soak. SEEK IMMEDIATE MEDICAL CARE IF:   Pain, swelling, or bruising worsens despite treatment.  You experience pain, numbness, discoloration, or coldness in the foot or toes.  New, unexplained symptoms develop (drugs used in treatment may produce side effects.) EXERCISES  PHASE I EXERCISES RANGE OF MOTION (ROM) AND STRETCHING EXERCISES - Ankle Sprain, Acute Phase I, Weeks 1 to 2 These exercises may help you when beginning to restore flexibility in your ankle. You will likely work on these exercises for the 1 to 2 weeks after your injury. Once your physician, physical therapist, or athletic trainer sees adequate progress, he or she will advance your exercises. While completing these exercises, remember:   Restoring tissue flexibility helps normal motion to return to the joints.  This allows healthier, less painful movement and activity.  An effective stretch should be held for at least 30 seconds.  A stretch should never be painful. You  should only feel a gentle lengthening or release in the stretched tissue. RANGE OF MOTION - Dorsi/Plantar Flexion  While sitting with your right / left knee straight, draw the top of your foot upwards by flexing your ankle. Then reverse the motion, pointing your toes downward.  Hold each position for __________ seconds.  After completing your first set of exercises, repeat this exercise with your knee bent. Repeat __________ times. Complete this exercise __________ times per day.  RANGE OF MOTION - Ankle Alphabet  Imagine your right / left big toe is a pen.  Keeping your hip and knee still, write out the entire alphabet with your "pen." Make the letters as large as you can without increasing any discomfort. Repeat __________ times. Complete this exercise __________ times per day.  STRENGTHENING EXERCISES - Ankle Sprain, Acute -Phase I, Weeks 1 to 2 These exercises may help you when beginning to restore strength in your ankle. You will likely work on these exercises for 1 to 2 weeks after your injury. Once your physician, physical therapist, or athletic trainer sees adequate progress, he or she will advance your exercises. While completing these exercises, remember:   Muscles can gain both the endurance and the strength needed for everyday activities through controlled exercises.  Complete these exercises as instructed by your physician, physical therapist, or athletic trainer. Progress the resistance and repetitions only as guided.  You may experience muscle soreness or fatigue, but the pain or discomfort you are trying to eliminate should never worsen during these exercises. If this pain does worsen, stop and make certain you are following the directions exactly. If the pain is still present after adjustments, discontinue the exercise until you can discuss the trouble with your clinician. STRENGTH - Dorsiflexors  Secure a rubber exercise band/tubing to a fixed object (i.e., table, pole) and  loop the other end around your right / left foot.  Sit on the floor facing the fixed object. The band/tubing should be slightly tense when your foot is relaxed.  Slowly draw your foot back toward you using your ankle and toes.  Hold this position for __________ seconds. Slowly release the tension in the band and return your foot to the starting position. Repeat __________ times. Complete this exercise __________ times per day.  STRENGTH - Plantar-flexors   Sit with your right / left leg extended. Holding onto both ends of a rubber exercise band/tubing, loop it around the ball of your foot. Keep a slight tension in the band.  Slowly push your toes away from you, pointing them downward.  Hold this position for __________ seconds. Return slowly, controlling the tension in the band/tubing. Repeat __________ times. Complete this exercise __________ times per day.  STRENGTH - Ankle Eversion  Secure one end of a rubber exercise band/tubing to a fixed object (table, pole). Loop the other end around your foot just before your toes.  Place your fists between your knees. This will focus your strengthening at your ankle.  Drawing the band/tubing across your opposite foot, slowly, pull your little toe out and up. Make sure the band/tubing is positioned to resist the entire motion.  Hold this position for __________ seconds. Have your muscles resist the band/tubing as it slowly pulls your foot back to the starting position.  Repeat __________ times. Complete this exercise __________ times per  day.  STRENGTH - Ankle Inversion  Secure one end of a rubber exercise band/tubing to a fixed object (table, pole). Loop the other end around your foot just before your toes.  Place your fists between your knees. This will focus your strengthening at your ankle.  Slowly, pull your big toe up and in, making sure the band/tubing is positioned to resist the entire motion.  Hold this position for __________  seconds.  Have your muscles resist the band/tubing as it slowly pulls your foot back to the starting position. Repeat __________ times. Complete this exercises __________ times per day.  STRENGTH - Towel Curls  Sit in a chair positioned on a non-carpeted surface.  Place your right / left foot on a towel, keeping your heel on the floor.  Pull the towel toward your heel by only curling your toes. Keep your heel on the floor.  If instructed by your physician, physical therapist, or athletic trainer, add weight to the end of the towel. Repeat __________ times. Complete this exercise __________ times per day.   This information is not intended to replace advice given to you by your health care provider. Make sure you discuss any questions you have with your health care provider.   Document Released: 11/04/2004 Document Revised: 04/26/2014 Document Reviewed: 07/18/2008   Elsevier Interactive Patient Education [employer].     I personally performed the services described in this documentation, which was scribed in my presence. The recorded information has been reviewed and considered, and addended by me as needed.

## 2015-03-28 NOTE — Patient Instructions (Signed)
Ankle brace as needed, range of motion as discussed. Ibuprofen 1-2 over-the-counter up to every 6 hours as needed with food. Stop this medication if any abdominal pain. See information on ankle sprain below recheck in the next 2 weeks if symptoms are not improved. Return to the clinic or go to the nearest emergency room if any of your symptoms worsen or new symptoms occur.  Keep appointment with your psychiatrist tomorrow.    RICE for Routine Care of Injuries Theroutine careofmanyinjuriesincludes rest, ice, compression, and elevation (RICE therapy). RICE therapy is often recommended for injuries to soft tissues, such as a muscle strain, ligament injuries, bruises, and overuse injuries. It can also be used for some bony injuries. Using RICE therapy can help to relieve pain, lessen swelling, and enable your body to heal. Rest Rest is required to allow your body to heal. This usually involves reducing your normal activities and avoiding use of the injured part of your body. Generally, you can return to your normal activities when you are comfortable and have been given permission by your health care provider. Ice Icing your injury helps to keep the swelling down, and it lessens pain. Do not apply ice directly to your skin.  Put ice in a plastic bag.  Place a towel between your skin and the bag.  Leave the ice on for 20 minutes, 2-3 times a day. Do this for as long as you are directed by your health care provider. Compression Compression means putting pressure on the injured area. Compression helps to keep swelling down, gives support, and helps with discomfort. Compression may be done with an elastic bandage. If an elastic bandage has been applied, follow these general tips:  Remove and reapply the bandage every 3-4 hours or as directed by your health care provider.  Make sure the bandage is not wrapped too tightly, because this can cut off circulation. If part of your body beyond the  bandage becomes blue, numb, cold, swollen, or more painful, your bandage is most likely too tight. If this occurs, remove your bandage and reapply it more loosely.  See your health care provider if the bandage seems to be making your problems worse rather than better. Elevation Elevation means keeping the injured area raised. This helps to lessen swelling and decrease pain. If possible, your injured area should be elevated at or above the level of your heart or the center of your chest. [place]? You should seek medical care if:  Your pain and swelling continue.  Your symptoms are getting worse rather than improving. These symptoms may indicate that further evaluation or further X-rays are needed. Sometimes, X-rays may not show a small broken bone (fracture) until a number of days later. Make a follow-up appointment with your health care provider. WHEN SHOULD I SEEK IMMEDIATE MEDICAL CARE? You should seek immediate medical care if:  You have sudden severe pain at or below the area of your injury.  You have redness or increased swelling around your injury.  You have tingling or numbness at or below the area of your injury that does not improve after you remove the elastic bandage.   This information is not intended to replace advice given to you by your health care provider. Make sure you discuss any questions you have with your health care provider.   Document Released: 07/18/2000 Document Revised: 12/25/2014 Document Reviewed: 03/13/2014 Elsevier Interactive Patient Education 2016 [employer]  Acute Ankle Sprain With Phase I Rehab An  acute ankle sprain is a partial or complete tear in one or more of the ligaments of the ankle due to traumatic injury. The severity of the injury depends on both the number of ligaments sprained and the grade of sprain. There are 3 grades of sprains.   A grade 1 sprain is a mild sprain. There is a slight pull without obvious  tearing. There is no loss of strength, and the muscle and ligament are the correct length.  A grade 2 sprain is a moderate sprain. There is tearing of fibers within the substance of the ligament where it connects two bones or two cartilages. The length of the ligament is increased, and there is usually decreased strength.  A grade 3 sprain is a complete rupture of the ligament and is uncommon. In addition to the grade of sprain, there are three types of ankle sprains.  Lateral ankle sprains: This is a sprain of one or more of the three ligaments on the outer side (lateral) of the ankle. These are the most common sprains. Medial ankle sprains: There is one large triangular ligament of the inner side (medial) of the ankle that is susceptible to injury. Medial ankle sprains are less common. Syndesmosis, "high ankle," sprains: The syndesmosis is the ligament that connects the two bones of the lower leg. Syndesmosis sprains usually only occur with very severe ankle sprains. SYMPTOMS  Pain, tenderness, and swelling in the ankle, starting at the side of injury that may progress to the whole ankle and foot with time.  "Pop" or tearing sensation at the time of injury.  Bruising that may spread to the heel.  Impaired ability to walk soon after injury. CAUSES   Acute ankle sprains are caused by trauma placed on the ankle that temporarily forces or pries the anklebone (talus) out of its normal socket.  Stretching or tearing of the ligaments that normally hold the joint in place (usually due to a twisting injury). RISK INCREASES WITH:  Previous ankle sprain.  Sports in which the foot may land awkwardly (i.e., basketball, volleyball, or soccer) or walking or running on uneven or rough surfaces.  Shoes with inadequate support to prevent sideways motion when stress occurs.  Poor strength and flexibility.  Poor balance skills.  Contact sports. PREVENTION   Warm up and stretch properly before  activity.  Maintain physical fitness:  Ankle and leg flexibility, muscle strength, and endurance.  Cardiovascular fitness.  Balance training activities.  Use proper technique and have a coach correct improper technique.  Taping, protective strapping, bracing, or high-top tennis shoes may help prevent injury. Initially, tape is best; however, it loses most of its support function within 10 to 15 minutes.  Wear proper-fitted protective shoes (High-top shoes with taping or bracing is more effective than either alone).  Provide the ankle with support during sports and practice activities for 12 months following injury. PROGNOSIS   If treated properly, ankle sprains can be expected to recover completely; however, the length of recovery depends on the degree of injury.  A grade 1 sprain usually heals enough in 5 to 7 days to allow modified activity and requires an average of 6 weeks to heal completely.  A grade 2 sprain requires 6 to 10 weeks to heal completely.  A grade 3 sprain requires 12 to 16 weeks to heal.  A syndesmosis sprain often takes more than 3 months to heal. RELATED COMPLICATIONS   Frequent recurrence of symptoms may result in a chronic problem. Appropriately addressing  the problem the first time decreases the frequency of recurrence and optimizes healing time. Severity of the initial sprain does not predict the likelihood of later instability.  Injury to other structures (bone, cartilage, or tendon).  A chronically unstable or arthritic ankle joint is a possibility with repeated sprains. TREATMENT Treatment initially involves the use of ice, medication, and compression bandages to help reduce pain and inflammation. Ankle sprains are usually immobilized in a walking cast or boot to allow for healing. Crutches may be recommended to reduce pressure on the injury. After immobilization, strengthening and stretching exercises may be necessary to regain strength and a full  range of motion. Surgery is rarely needed to treat ankle sprains. MEDICATION   Nonsteroidal anti-inflammatory medications, such as aspirin and ibuprofen (do not take for the first 3 days after injury or within 7 days before surgery), or other minor pain relievers, such as acetaminophen, are often recommended. Take these as directed by your caregiver. Contact your caregiver immediately if any bleeding, stomach upset, or signs of an allergic reaction occur from these medications.  Ointments applied to the skin may be helpful.  Pain relievers may be prescribed as necessary by your caregiver. Do not take prescription pain medication for longer than 4 to 7 days. Use only as directed and only as much as you need. HEAT AND COLD  Cold treatment (icing) is used to relieve pain and reduce inflammation for acute and chronic cases. Cold should be applied for 10 to 15 minutes every 2 to 3 hours for inflammation and pain and immediately after any activity that aggravates your symptoms. Use ice packs or an ice massage.  Heat treatment may be used before performing stretching and strengthening activities prescribed by your caregiver. Use a heat pack or a warm soak. SEEK IMMEDIATE MEDICAL CARE IF:   Pain, swelling, or bruising worsens despite treatment.  You experience pain, numbness, discoloration, or coldness in the foot or toes.  New, unexplained symptoms develop (drugs used in treatment may produce side effects.) EXERCISES  PHASE I EXERCISES RANGE OF MOTION (ROM) AND STRETCHING EXERCISES - Ankle Sprain, Acute Phase I, Weeks 1 to 2 These exercises may help you when beginning to restore flexibility in your ankle. You will likely work on these exercises for the 1 to 2 weeks after your injury. Once your physician, physical therapist, or athletic trainer sees adequate progress, he or she will advance your exercises. While completing these exercises, remember:   Restoring tissue flexibility helps normal motion  to return to the joints. This allows healthier, less painful movement and activity.  An effective stretch should be held for at least 30 seconds.  A stretch should never be painful. You should only feel a gentle lengthening or release in the stretched tissue. RANGE OF MOTION - Dorsi/Plantar Flexion  While sitting with your right / left knee straight, draw the top of your foot upwards by flexing your ankle. Then reverse the motion, pointing your toes downward.  Hold each position for __________ seconds.  After completing your first set of exercises, repeat this exercise with your knee bent. Repeat __________ times. Complete this exercise __________ times per day.  RANGE OF MOTION - Ankle Alphabet  Imagine your right / left big toe is a pen.  Keeping your hip and knee still, write out the entire alphabet with your "pen." Make the letters as large as you can without increasing any discomfort. Repeat __________ times. Complete this exercise __________ times per day.  STRENGTHENING EXERCISES -  Ankle Sprain, Acute -Phase I, Weeks 1 to 2 These exercises may help you when beginning to restore strength in your ankle. You will likely work on these exercises for 1 to 2 weeks after your injury. Once your physician, physical therapist, or athletic trainer sees adequate progress, he or she will advance your exercises. While completing these exercises, remember:   Muscles can gain both the endurance and the strength needed for everyday activities through controlled exercises.  Complete these exercises as instructed by your physician, physical therapist, or athletic trainer. Progress the resistance and repetitions only as guided.  You may experience muscle soreness or fatigue, but the pain or discomfort you are trying to eliminate should never worsen during these exercises. If this pain does worsen, stop and make certain you are following the directions exactly. If the pain is still present after  adjustments, discontinue the exercise until you can discuss the trouble with your clinician. STRENGTH - Dorsiflexors  Secure a rubber exercise band/tubing to a fixed object (i.e., table, pole) and loop the other end around your right / left foot.  Sit on the floor facing the fixed object. The band/tubing should be slightly tense when your foot is relaxed.  Slowly draw your foot back toward you using your ankle and toes.  Hold this position for __________ seconds. Slowly release the tension in the band and return your foot to the starting position. Repeat __________ times. Complete this exercise __________ times per day.  STRENGTH - Plantar-flexors   Sit with your right / left leg extended. Holding onto both ends of a rubber exercise band/tubing, loop it around the ball of your foot. Keep a slight tension in the band.  Slowly push your toes away from you, pointing them downward.  Hold this position for __________ seconds. Return slowly, controlling the tension in the band/tubing. Repeat __________ times. Complete this exercise __________ times per day.  STRENGTH - Ankle Eversion  Secure one end of a rubber exercise band/tubing to a fixed object (table, pole). Loop the other end around your foot just before your toes.  Place your fists between your knees. This will focus your strengthening at your ankle.  Drawing the band/tubing across your opposite foot, slowly, pull your little toe out and up. Make sure the band/tubing is positioned to resist the entire motion.  Hold this position for __________ seconds. Have your muscles resist the band/tubing as it slowly pulls your foot back to the starting position.  Repeat __________ times. Complete this exercise __________ times per day.  STRENGTH - Ankle Inversion  Secure one end of a rubber exercise band/tubing to a fixed object (table, pole). Loop the other end around your foot just before your toes.  Place your fists between your knees.  This will focus your strengthening at your ankle.  Slowly, pull your big toe up and in, making sure the band/tubing is positioned to resist the entire motion.  Hold this position for __________ seconds.  Have your muscles resist the band/tubing as it slowly pulls your foot back to the starting position. Repeat __________ times. Complete this exercises __________ times per day.  STRENGTH - Towel Curls  Sit in a chair positioned on a non-carpeted surface.  Place your right / left foot on a towel, keeping your heel on the floor.  Pull the towel toward your heel by only curling your toes. Keep your heel on the floor.  If instructed by your physician, physical therapist, or athletic trainer, add weight to the  end of the towel. Repeat __________ times. Complete this exercise __________ times per day.   This information is not intended to replace advice given to you by your health care provider. Make sure you discuss any questions you have with your health care provider.   Document Released: 11/04/2004 Document Revised: 04/26/2014 Document Reviewed: 07/18/2008   Elsevier Interactive Patient Education [employer].

## 2015-09-04 ENCOUNTER — Other Ambulatory Visit: Payer: Self-pay | Admitting: Physician Assistant

## 2015-09-04 ENCOUNTER — Other Ambulatory Visit (HOSPITAL_COMMUNITY)
Admission: RE | Admit: 2015-09-04 | Discharge: 2015-09-04 | Disposition: A | Discharge status: Home / Self Care | Payer: Managed Care, Other (non HMO) | Source: Ambulatory Visit | Attending: Physician Assistant | Admitting: Physician Assistant

## 2015-09-04 DIAGNOSIS — Z113 Encounter for screening for infections with a predominantly sexual mode of transmission: Secondary | ICD-10-CM | POA: Insufficient documentation

## 2015-09-04 DIAGNOSIS — Z01419 Encounter for gynecological examination (general) (routine) without abnormal findings: Secondary | ICD-10-CM | POA: Insufficient documentation

## 2015-09-04 DIAGNOSIS — Z1151 Encounter for screening for human papillomavirus (HPV): Secondary | ICD-10-CM | POA: Diagnosis not present

## 2015-09-08 LAB — CYTOLOGY - PAP

## 2015-09-10 ENCOUNTER — Ambulatory Visit: Payer: Managed Care, Other (non HMO) | Admitting: Podiatry

## 2015-09-17 ENCOUNTER — Encounter: Payer: Self-pay | Admitting: Podiatry

## 2015-09-17 ENCOUNTER — Ambulatory Visit (INDEPENDENT_AMBULATORY_CARE_PROVIDER_SITE_OTHER): Payer: Managed Care, Other (non HMO) | Admitting: Podiatry

## 2015-09-17 VITALS — BP 143/87 | HR 100 | Resp 20 | Ht 61.0 in | Wt 259.0 lb

## 2015-09-17 DIAGNOSIS — L600 Ingrowing nail: Secondary | ICD-10-CM | POA: Diagnosis not present

## 2015-09-17 MED ORDER — HYDROCODONE-ACETAMINOPHEN 10-325 MG PO TABS: 1.0000 | ORAL_TABLET | Freq: Three times a day (TID) | ORAL | Status: DC | PRN

## 2015-09-17 NOTE — Progress Notes (Signed)
Subjective:     Patient ID: Patricia Rodriguez, female   DOB: [birth year], 50 y.o.   MRN: [id number]  HPI patient presents stating she's had several nails that are really bothering her and making it hard to wear shoe gear comfortably. 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 I explained procedures and risk and she wants surgery and today I infiltrated with 240 mg of Xylocaine Marcaine mixture remove the third fourth and fifth nails on the left foot and applied chemical phenol for applications 30 seconds and the fifth nail the right foot applying chemical and alcohol solution. Applied sterile dressing and instructed on soaks and reappoint to recheck

## 2015-09-17 NOTE — Progress Notes (Signed)
   Subjective:    Patient ID: Patricia Rodriguez, female    DOB: December 26, 1965, 50 y.o.   MRN: [id number]  HPI "I need to get 3 toenails on the left foot removed and the baby toenail on the right foot.  I can't cut them with normal clippers.  They're painful in enclosed shoes.  They're infected."    Review of Systems     Objective:   Physical Exam        Assessment & Plan:

## 2015-10-03 ENCOUNTER — Telehealth: Payer: Self-pay | Admitting: *Deleted

## 2015-10-03 NOTE — Telephone Encounter (Addendum)
Pt called states her feet are swelling and did not after the previous toe procedure.  I left message informing that if the toes were red, swollen and with drainage to please call for an appt, but if generalized swelling of the feet without evolvement of the toes it was more than likely related to the heat and fluid retention concerns that her PCP could address.  I told pt to call again so I could discuss with her. Pt called again left message stating she needed an note to wear flip-flops with her dress casual at work, and will call with the fax number. 10/07/2015-Pt called with fax number [phone], for note to wear flip-flops for 2 weeks during dress casual at work.  Note faxed to pt.

## 2015-10-03 NOTE — Telephone Encounter (Signed)
Left message for patient at [phone] (Home #) to check to see how they were doing from having their nails removed from their third, fourth and fifth toes on their Left foot on Wednesday, Sep 17, 2015. Waiting for a response.

## 2015-10-07 ENCOUNTER — Encounter: Payer: Self-pay | Admitting: *Deleted

## 2015-10-27 ENCOUNTER — Encounter (HOSPITAL_COMMUNITY): Payer: Self-pay | Admitting: Emergency Medicine

## 2015-10-27 ENCOUNTER — Ambulatory Visit (HOSPITAL_COMMUNITY)
Admission: EM | Admit: 2015-10-27 | Discharge: 2015-10-27 | Disposition: A | Discharge status: Home / Self Care | Payer: Managed Care, Other (non HMO) | Attending: Family Medicine | Admitting: Family Medicine

## 2015-10-27 DIAGNOSIS — N390 Urinary tract infection, site not specified: Secondary | ICD-10-CM | POA: Diagnosis not present

## 2015-10-27 LAB — POCT URINALYSIS DIP (DEVICE)
Bilirubin Urine: NEGATIVE
Glucose, UA: NEGATIVE mg/dL
Ketones, ur: NEGATIVE mg/dL
Nitrite: POSITIVE — AB
PH: 6 (ref 5.0–8.0)
Protein, ur: NEGATIVE mg/dL
Specific Gravity, Urine: 1.015 (ref 1.005–1.030)
Urobilinogen, UA: 0.2 mg/dL (ref 0.0–1.0)

## 2015-10-27 MED ORDER — PHENAZOPYRIDINE HCL 200 MG PO TABS: 200.0000 mg | ORAL_TABLET | Freq: Three times a day (TID) | ORAL | Status: DC

## 2015-10-27 MED ORDER — CIPROFLOXACIN HCL 500 MG PO TABS: 500.0000 mg | ORAL_TABLET | Freq: Two times a day (BID) | ORAL | Status: DC

## 2015-10-27 NOTE — Discharge Instructions (Signed)
Antibiotic Medicine °Antibiotic medicines are used to treat infections caused by bacteria. They work by hurting or killing the germs that are making you sick. °HOW WILL MY MEDICINE BE PICKED? °There are many kinds of antibiotic medicines. To help your doctor pick one, tell your doctor if: °· You have any allergies. °· You are pregnant or plan to get pregnant. °· You are breastfeeding. °· You are taking any medicines. These include over-the-counter medicines, prescription medicines, and herbal remedies. °· You have a medical condition or problem. °If you have questions about why your medicine was picked, ask. °FOR HOW LONG SHOULD I TAKE MY MEDICINE? °Take your medicine for as long as your doctor tells you to. Do not stop taking it when you feel better. If you stop taking it too soon: °· You may start to feel sick again. °· Your infection may get harder to treat. °· New problems may develop. °WHAT IF I MISS A DOSE? °Try not to miss any doses of antibiotic medicine. If you miss a dose: °· Take the dose as soon as you can. °· If you are taking 2 doses a day, take the next dose in 5 to 6 hours. °· If you are taking 3 or more doses a day, take the next dose in 2 to 4 hours. Then go back to the normal schedule. °If you cannot take a missed dose, take the next dose on time. Then take the missed dose after you have taken all the doses as told by your doctor, as if you had one more dose left. °DOES THIS MEDICINE AFFECT BIRTH CONTROL? °Birth control pills may not work while you are on antibiotic medicines. If you are taking birth control pills, keep taking them as usual. Use a second form of birth control, such as a condom. Keep using the second form of birth control until you are finished with your current 1 month cycle of birth control pills. °GET HELP IF: °· You get worse. °· You do not feel better a few days after starting the medicine. °· You throw up (vomit). °· There are white patches in your mouth. °· You have new  joint pain after starting the medicine. °· You have new muscle aches after starting the medicine. °· You had a fever before starting the medicine, and it comes back. °· You have any symptoms of an allergic reaction, such as an itchy rash. If this happens, stop taking the medicine. °GET HELP RIGHT AWAY IF: °· Your pee (urine) turns dark or becomes blood-colored. °· Your skin turns yellow. °· You bruise or bleed easily. °· You have very bad watery poop (diarrhea) and cramps in your belly (abdomen). °· You have a very bad headache. °· You have signs of a very bad allergic reaction, such as: °¨ Trouble breathing. °¨ Wheezing. °¨ Swelling of the lips, tongue, or face. °¨ Fainting. °¨ Blisters on the skin or in the mouth. °If you have signs of a very bad allergic reaction, stop taking the antibiotic medicine right away. °  °This information is not intended to replace advice given to you by your health care provider. Make sure you discuss any questions you have with your health care provider. °  °Document Released: 01/13/2008 Document Revised: 12/25/2014 Document Reviewed: 08/21/2014 °Elsevier Interactive Patient Education ©2016 [employer] ° °

## 2015-10-27 NOTE — ED Provider Notes (Signed)
CSN: [id number]     Arrival date & time 10/27/15  1759 History   None    Chief Complaint  Patient presents with  . Urinary Frequency  . Dysuria   (Consider location/radiation/quality/duration/timing/severity/associated sxs/prior Treatment) Patient is a 50 y.o. female presenting with frequency and dysuria. The history is provided by the patient.  Urinary Frequency This is a new problem. The current episode started more than 2 days ago. The problem occurs constantly. The problem has been gradually worsening. Nothing aggravates the symptoms. Nothing relieves the symptoms. She has tried nothing for the symptoms.  Dysuria   Past Medical History  Diagnosis Date  . Mental disorder   . Anxiety   . Depression   . IBS (irritable bowel syndrome)   . Headache(784.0)    History reviewed. No pertinent past surgical history. Family History  Problem Relation Age of Onset  . Depression Maternal Aunt   . Alcohol abuse Maternal Aunt   . Drug abuse Maternal Aunt   . Drug abuse Father   . Alcohol abuse Paternal Aunt   . Drug abuse Paternal Aunt   . Alcohol abuse Maternal Uncle   . Drug abuse Maternal Uncle   . Alcohol abuse Paternal Uncle   . Drug abuse Paternal Uncle    Social History  Substance Use Topics  . Smoking status: Never Smoker   . Smokeless tobacco: Never Used  . Alcohol Use: 0.0 oz/week    0 Standard drinks or equivalent per week     Comment: socially   OB History    No data available     Review of Systems  Constitutional: Negative.   HENT: Negative.   Eyes: Negative.   Respiratory: Negative.   Cardiovascular: Negative.   Endocrine: Negative.   Genitourinary: Positive for dysuria and frequency.  Allergic/Immunologic: Negative.   Neurological: Negative.   Hematological: Negative.   Psychiatric/Behavioral: Negative.     Allergies  Oxycodone and Penicillins  Home Medications   Prior to Admission medications   Medication Sig Start Date End Date Taking?  Authorizing Provider  ALPRAZolam Duanne Moron) 1 MG tablet Take 1 mg by mouth 3 (three) times daily.   Yes Historical Provider, MD  Armodafinil (NUVIGIL PO) Take by mouth daily.   Yes Historical Provider, MD  buPROPion (WELLBUTRIN XL) 150 MG 24 hr tablet Take 150 mg by mouth daily.    Yes Historical Provider, MD  dicyclomine (BENTYL) 20 MG tablet Take 20 mg by mouth as needed.   Yes Historical Provider, MD  HYDROcodone-acetaminophen (NORCO) 10-325 MG tablet Take 1 tablet by mouth every 8 (eight) hours as needed. 09/17/15  Yes Tamala Fothergill Regal, DPM  lurasidone (LATUDA) 40 MG TABS tablet Take by mouth 1 day or 1 dose. Take 60 mgQHS   Yes Historical Provider, MD  NUVIGIL 250 MG tablet  09/30/14  Yes Historical Provider, MD  pantoprazole (PROTONIX) 20 MG tablet Take 20 mg by mouth. Reported on 09/17/2015   Yes Historical Provider, MD   Meds Ordered and Administered this Visit  Medications - No data to display  BP 167/116 mmHg  Pulse 99  Temp(Src) 98.9 F (37.2 C) (Oral)  Resp 20  SpO2 100% No data found.   Physical Exam  Constitutional: She appears well-developed and well-nourished.  HENT:  Head: Normocephalic.  Right Ear: External ear normal.  Left Ear: External ear normal.  Mouth/Throat: Oropharynx is clear and moist.  Eyes: Conjunctivae are normal. Pupils are equal, round, and reactive to light.  Neck: Normal range  of motion. Neck supple.  Cardiovascular: Normal rate, regular rhythm and normal heart sounds.   Pulmonary/Chest: Effort normal and breath sounds normal.  Abdominal: Soft. Bowel sounds are normal.    ED Course  Procedures (including critical care time)  Labs Review Labs Reviewed  POCT URINALYSIS DIP (DEVICE) - Abnormal; Notable for the following:    Hgb urine dipstick TRACE (*)    Nitrite POSITIVE (*)    Leukocytes, UA SMALL (*)    All other components within normal limits    Imaging Review No results found.   Visual Acuity Review  Right Eye Distance:   Left Eye  Distance:   Bilateral Distance:    Right Eye Near:   Left Eye Near:    Bilateral Near:         MDM   UTI - Cipro 500mg  one po bid x 10 days #20 Pyridium 200mg  one po tid x 2 d #6  Lysbeth Penner, FNP 10/27/15 1936

## 2015-10-27 NOTE — ED Notes (Signed)
The patient presented to the [facility] with a complaint of urinary frequency and dysuria x 2 days.

## 2016-01-29 ENCOUNTER — Encounter: Payer: Managed Care, Other (non HMO) | Attending: Physician Assistant

## 2016-01-29 DIAGNOSIS — E119 Type 2 diabetes mellitus without complications: Secondary | ICD-10-CM | POA: Diagnosis present

## 2016-01-29 NOTE — [place] Notes (Signed)
Patient was seen on 01/29/16 for the first of a series of three diabetes self-management courses at the [facility].  Patient Education Plan per assessed needs and concerns is to attend four course education program for Diabetes Self Management Education.  The following learning objectives were met by the patient during this class:  Describe diabetes  State some common risk factors for diabetes  Defines the role of glucose and insulin  Identifies type of diabetes and pathophysiology  Describe the relationship between diabetes and cardiovascular risk  State the members of the Healthcare Team  States the rationale for glucose monitoring  State when to test glucose  State their individual Target Range  State the importance of logging glucose readings  Describe how to interpret glucose readings  Identifies A1C target  Explain the correlation between A1c and eAG values  State symptoms and treatment of high blood glucose  State symptoms and treatment of low blood glucose  Explain proper technique for glucose testing  Identifies proper sharps disposal  Handouts given during class include:  Living Well with Diabetes book  Carb Counting and Meal Planning book  Meal Plan Card  Carbohydrate guide  Meal planning worksheet  Low Sodium Flavoring Tips  The diabetes portion plate  S0Y to eAG Conversion Chart  Diabetes Medications  Diabetes Recommended Care Schedule  Support Group  Diabetes Success Plan  Core Class Satisfaction Survey  Follow-Up Plan:  Attend core 2

## 2016-02-01 IMAGING — CT CT HEAD W/O CM
2 series · 16 of 30 positions shown, 20 images · non-contrast
Comparison: [DATE]

CLINICAL DATA: intentional ingestion of 50-60 1mg Xanax tablets
around 2030 and suicidal w/ plan to OD. Denies pain. Denies HI/AV.
Hx of previous suicide attempts. A & Ox4. Hx of anxiety and
depression

EXAM:
CT HEAD WITHOUT CONTRAST
TECHNIQUE: Contiguous axial images were obtained from the base of the skull
through the vertex without intravenous contrast.

[Series 2: head w/o · axial · non-contrast · 0.45mm/px · z∈[-165,-45]mm · 13 of 28 slices shown, 17 images]
[im 2/28  brain]
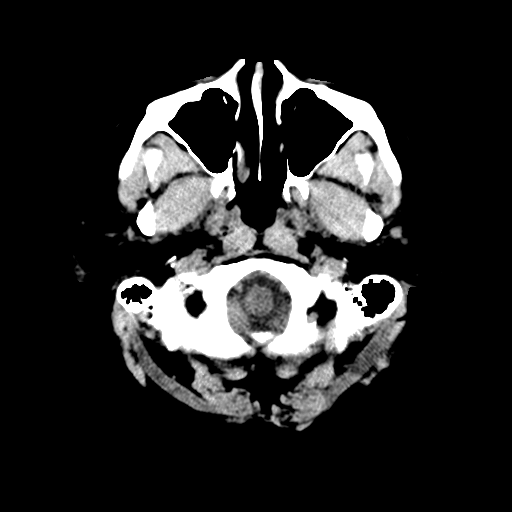
[im 2/28  bone]
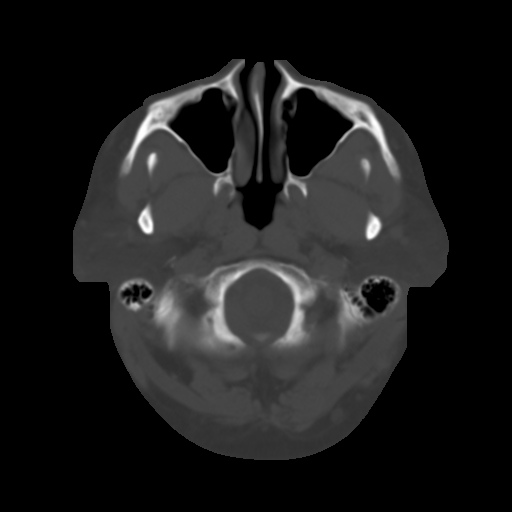
[im 4/28  brain]
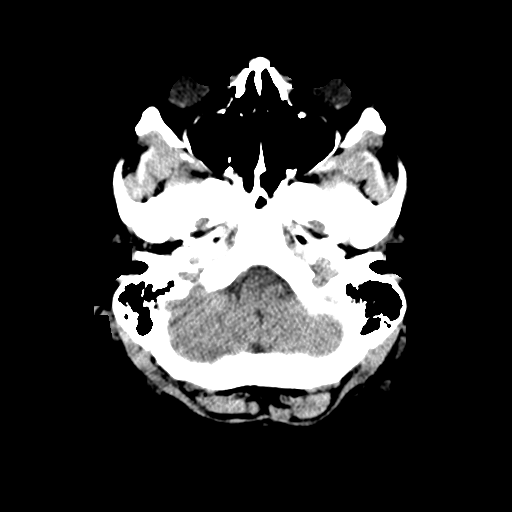
[im 6/28  brain]
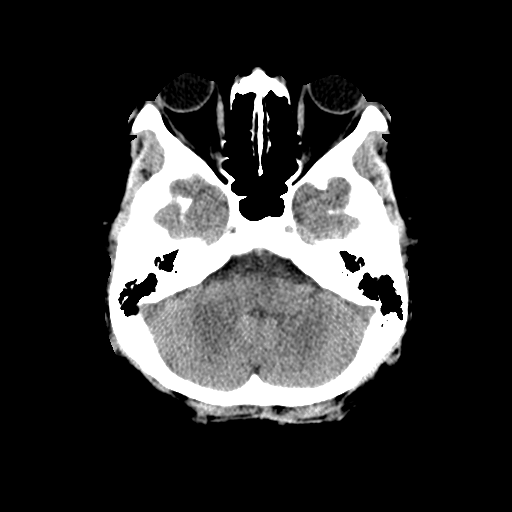
[im 8/28  brain]
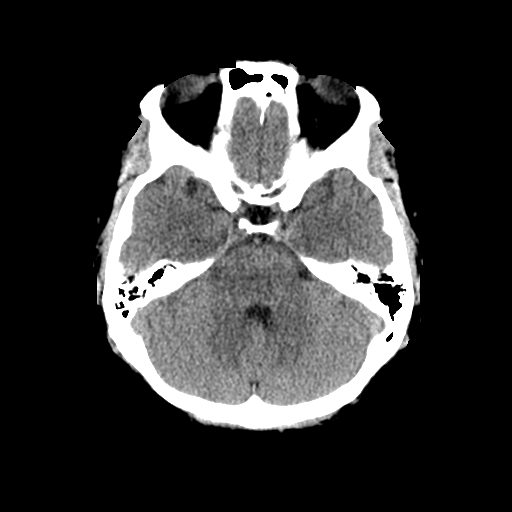
[im 10/28  brain]
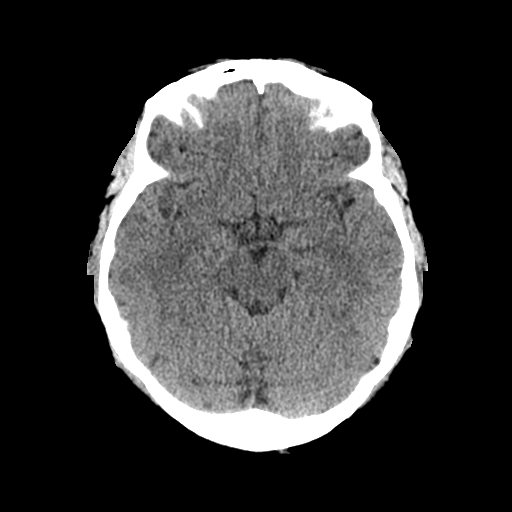
[im 10/28  bone]
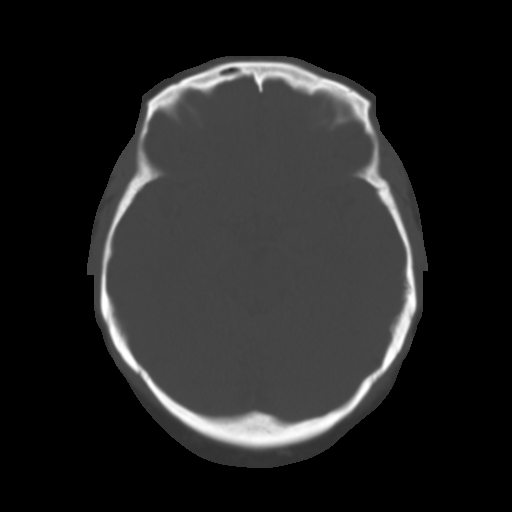
[im 12/28  brain]
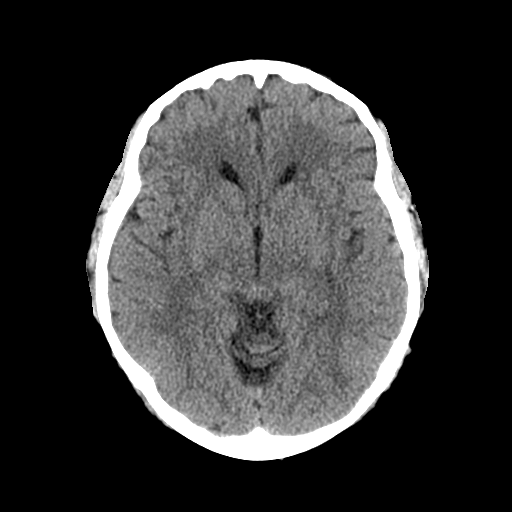
[im 14/28  brain]
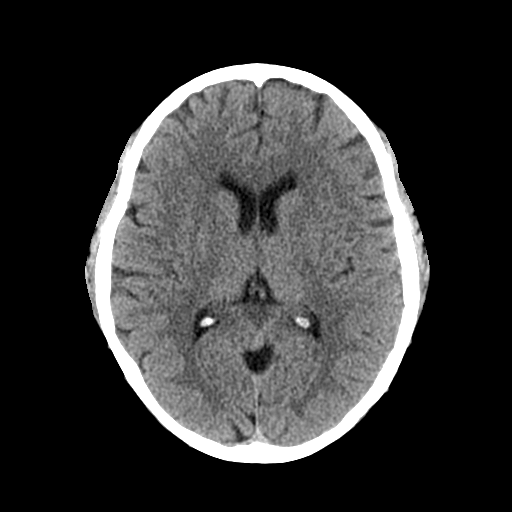
[im 16/28  brain]
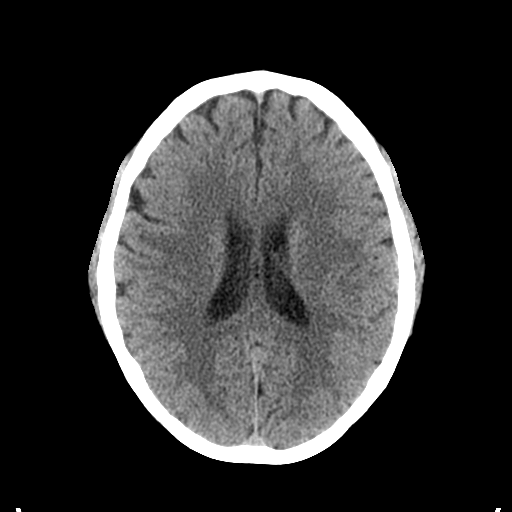
[im 18/28  brain]
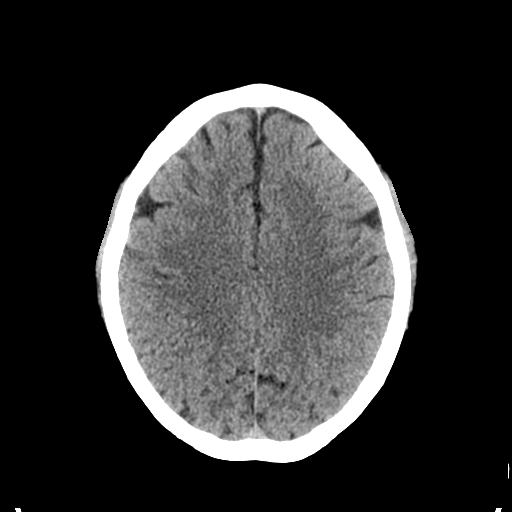
[im 18/28  bone]
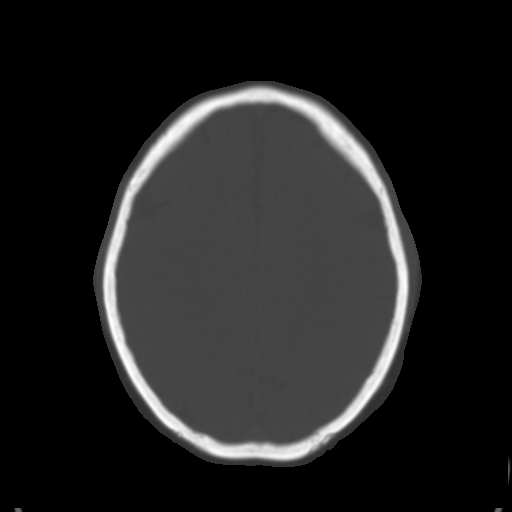
[im 20/28  brain]
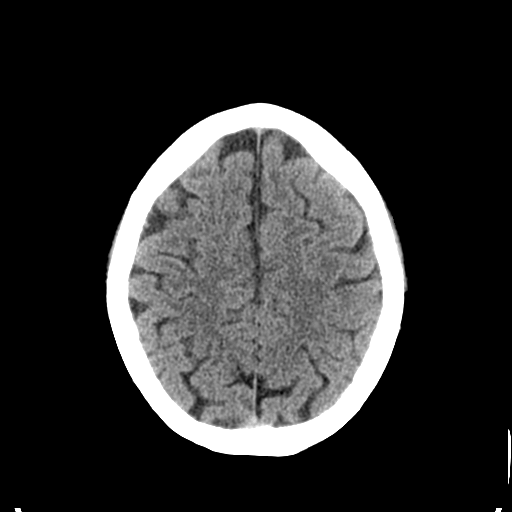
[im 22/28  brain]
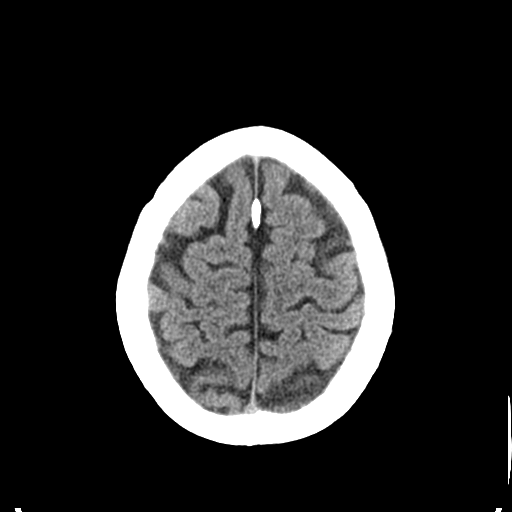
[im 24/28  brain]
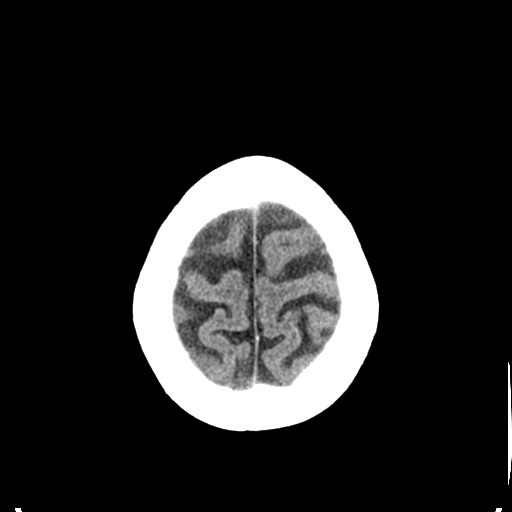
[im 26/28  brain]
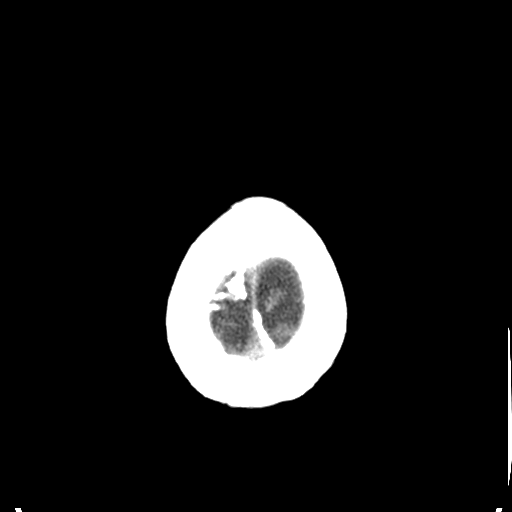
[im 26/28  bone]
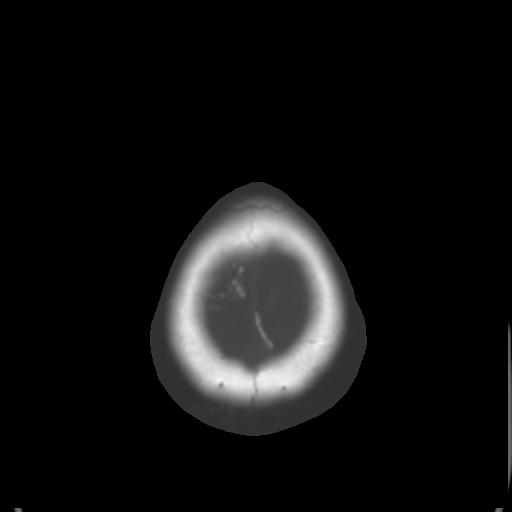

[Series 3: bone windows · axial · 0.45mm/px · z∈[-165,-125]mm · 3 of 28 slices shown]
[im 2/28  bone]
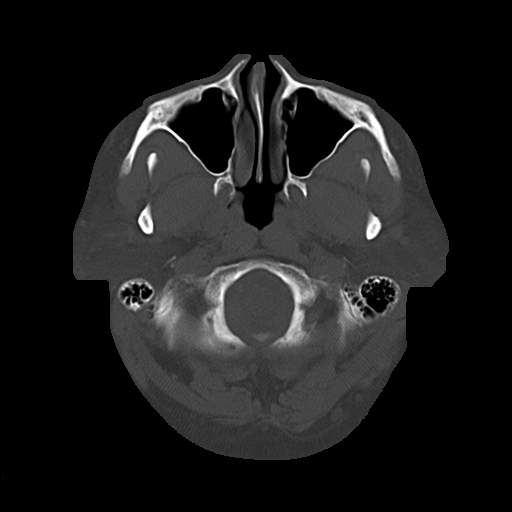
[im 6/28  bone]
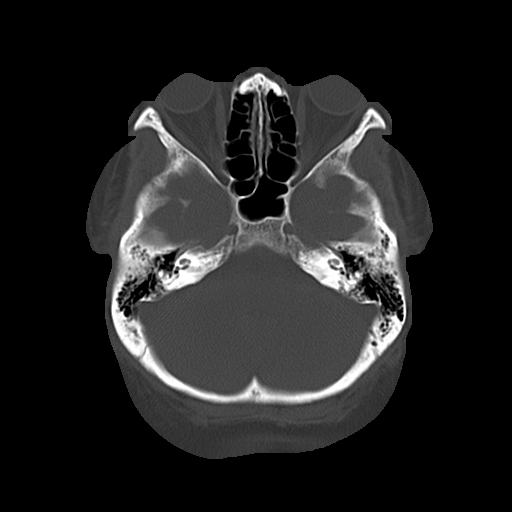
[im 10/28  bone]
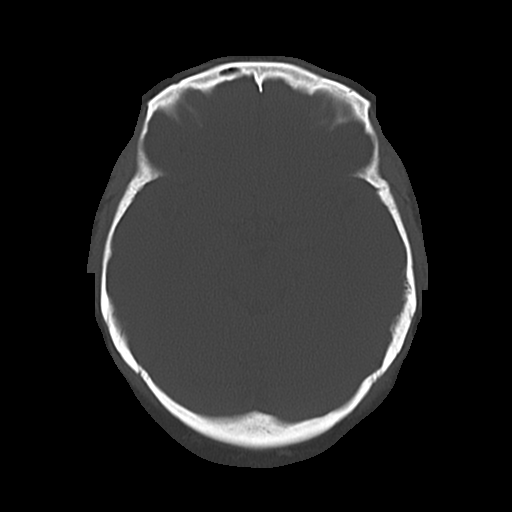

[16 of 30 positions shown; findings below may reference images not displayed]

FINDINGS: There is no evidence of acute intracranial hemorrhage, brain edema,
mass lesion, acute infarction, mass effect, or midline shift. Acute
infarct may be inapparent on noncontrast CT. No other intra-axial
abnormalities are seen, and the ventricles and sulci are within
normal limits in size and symmetry. No abnormal extra-axial fluid
collections or masses are identified. No significant calvarial
abnormality.
IMPRESSION: 1. Negative for bleed or other acute intracranial process.

## 2016-02-01 IMAGING — CR DG CHEST 1V PORT
1 series · 1 of 1 positions shown · non-contrast
Comparison: None.

CLINICAL DATA: Drug ingestion.

EXAM:
PORTABLE CHEST - 1 VIEW

[AP]
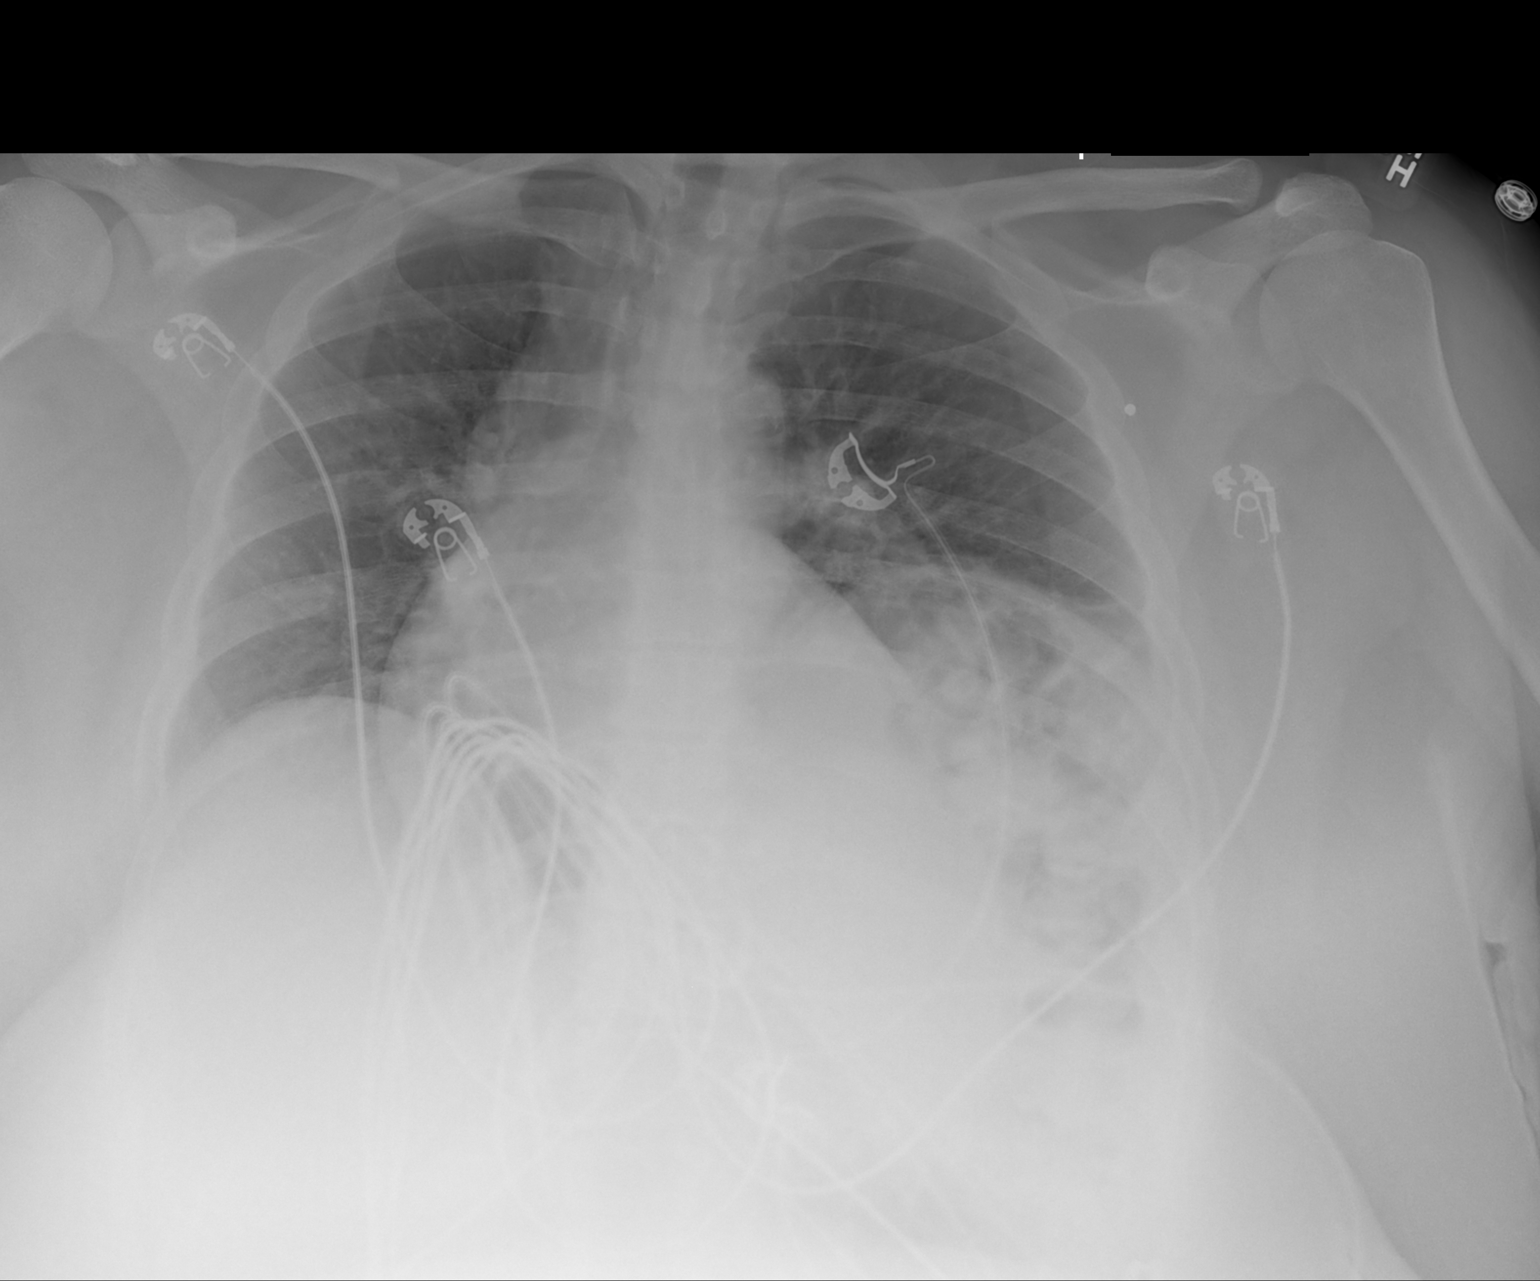

[1 of 1 positions shown; findings below may reference images not displayed]

FINDINGS: Lung volumes are low bilaterally. There is elevation of the left
hemidiaphragm. There is no evidence of pulmonary edema,
consolidation, pneumothorax, nodule or pleural fluid. The heart size
and mediastinal contours are within normal limits. Visualized bony
structures are unremarkable.
IMPRESSION: No active disease.

## 2016-02-05 ENCOUNTER — Ambulatory Visit: Payer: Self-pay

## 2016-02-12 ENCOUNTER — Ambulatory Visit: Payer: Self-pay

## 2016-02-26 ENCOUNTER — Encounter: Payer: Managed Care, Other (non HMO) | Attending: Physician Assistant

## 2016-02-26 DIAGNOSIS — E119 Type 2 diabetes mellitus without complications: Secondary | ICD-10-CM | POA: Insufficient documentation

## 2016-02-26 NOTE — [place] Notes (Signed)
Patient was seen on 02/26/16 for the completion of a three part series for diabetes self-management courses at the [facility]. The following learning objectives were met by the patient during this class:   Describe the role of different macronutrients on glucose  Explain how carbohydrates affect blood glucose  State what foods contain the most carbohydrates  Demonstrate carbohydrate counting  Demonstrate how to read Nutrition Facts food label  Describe effects of various fats on heart health  Describe the importance of good nutrition for health and healthy eating strategies  Describe techniques for managing your shopping, cooking and meal planning  List strategies to follow meal plan when dining out  Describe the effects of alcohol on glucose and how to use it safely  . State the amount of activity recommended for healthy living . Describe activities suitable for individual needs . Identify ways to regularly incorporate activity into daily life . Identify barriers to activity and ways to over come these barriers . Evaluate success in meeting personal goals . Establish 2-3 goals that they will plan to diligently work on  Goals:  Follow Diabetes Meal Plan as instructed  Eat 3 meals and 2 snacks, every 3-5 hrs  Limit carbohydrate intake to 45 grams carbohydrate/meal Limit carbohydrate intake to 15 grams carbohydrate/snack Add lean protein foods to meals/snacks   I will be active 30 minutes or more 2 times a week  I will take my diabetes medications as scheduled  Your patient has identified these potential barriers to change:  Motivation  Plan:  Attend Support Group as desired Call for individual follow-up as desired

## 2016-03-04 ENCOUNTER — Ambulatory Visit: Payer: Self-pay

## 2016-10-28 IMAGING — CR DG ANKLE COMPLETE 3+V*R*
1 series · 1 of 1 positions shown · non-contrast
Comparison: None.

CLINICAL DATA: Right ankle pain, injury 6 days ago, lateral
malleolus pain

EXAM:
RIGHT ANKLE - COMPLETE 3+ VIEW

[AP]
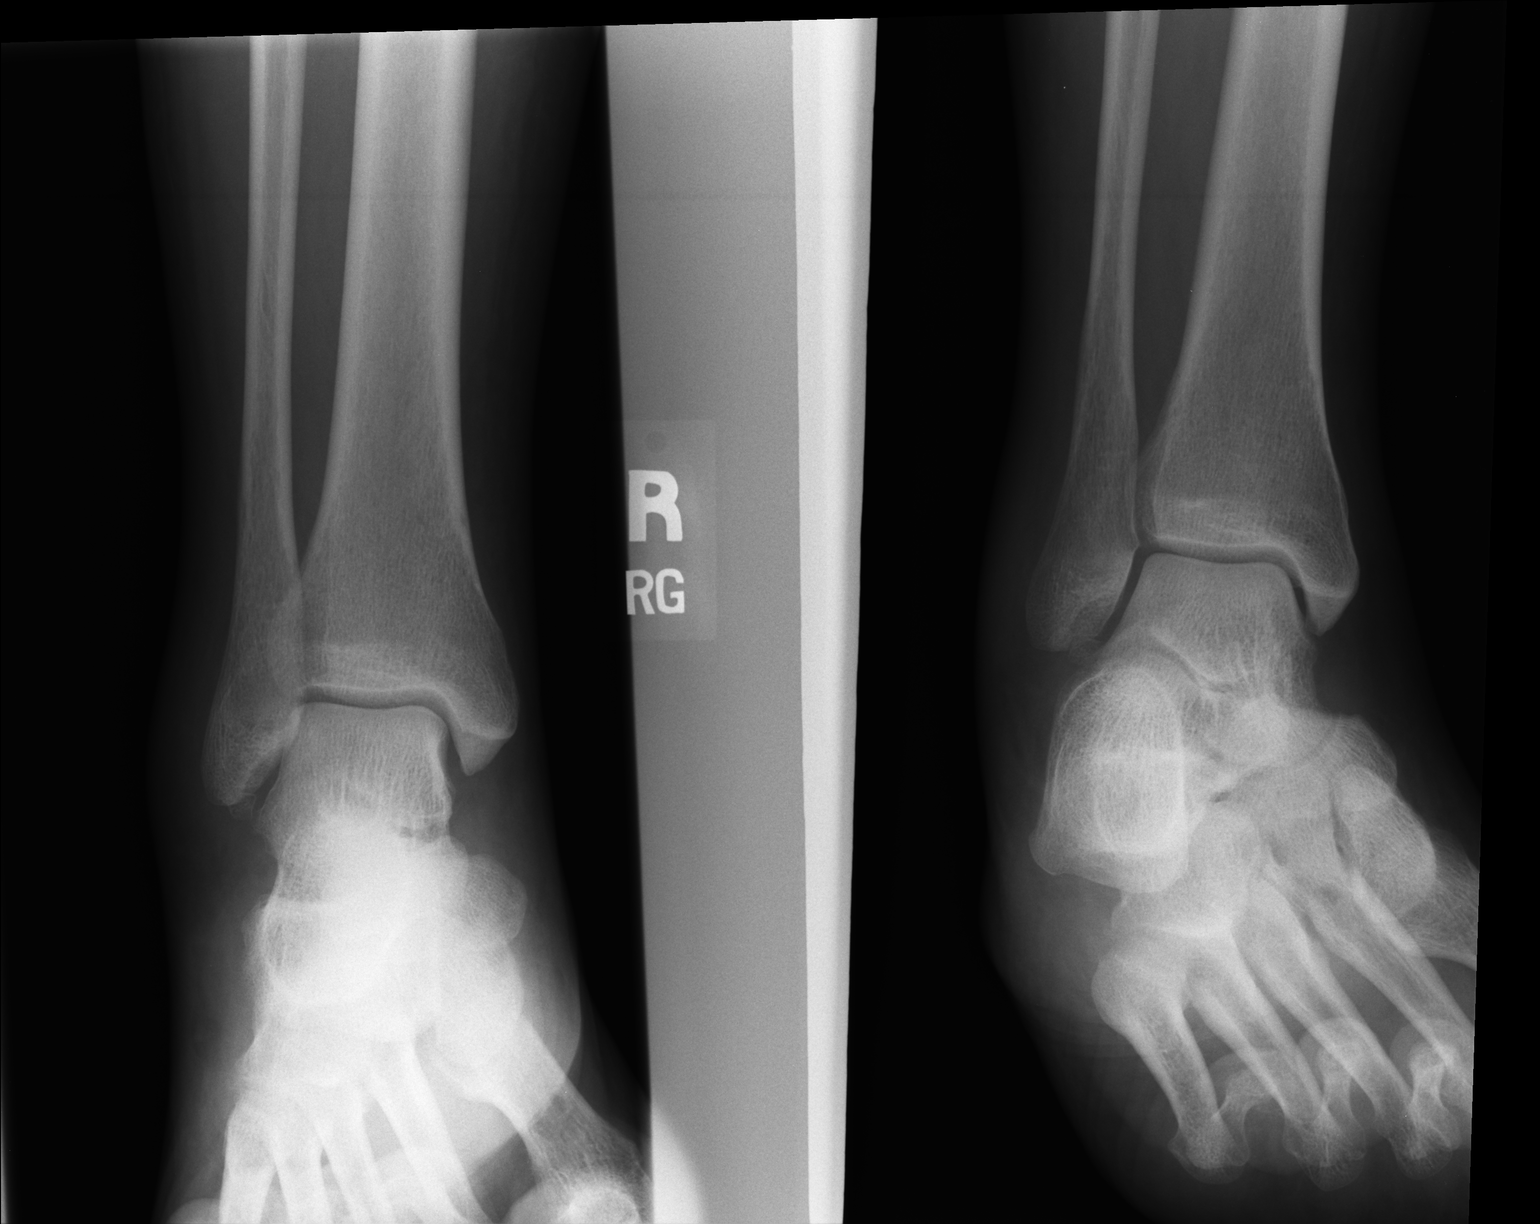

[1 of 1 positions shown; findings below may reference images not displayed]

FINDINGS: Two views of the right ankle submitted. No acute fracture or
subluxation. Ankle mortise is preserved. Mild lateral soft tissue
swelling. There is well corticated bony fragment adjacent to tip of
distal fibula most likely from prior injury. Clinical correlation is
necessary.
IMPRESSION: No acute fracture or subluxation. Ankle mortise is preserved. Mild
lateral soft tissue swelling. There is well corticated bony fragment
adjacent to tip of distal fibula most likely from prior injury.
Clinical correlation is necessary.

## 2018-05-01 ENCOUNTER — Inpatient Hospital Stay (HOSPITAL_COMMUNITY)
Admission: EM | Admit: 2018-05-01 | Discharge: 2018-05-03 | DRG: 918 | Disposition: A | Discharge status: Other Acute Inpatient Hospital | Payer: BLUE CROSS/BLUE SHIELD | Attending: Internal Medicine | Admitting: Internal Medicine

## 2018-05-01 ENCOUNTER — Encounter (HOSPITAL_COMMUNITY): Payer: Self-pay

## 2018-05-01 DIAGNOSIS — D473 Essential (hemorrhagic) thrombocythemia: Secondary | ICD-10-CM | POA: Diagnosis present

## 2018-05-01 DIAGNOSIS — Z79899 Other long term (current) drug therapy: Secondary | ICD-10-CM

## 2018-05-01 DIAGNOSIS — Z915 Personal history of self-harm: Secondary | ICD-10-CM

## 2018-05-01 DIAGNOSIS — T443X1A Poisoning by other parasympatholytics [anticholinergics and antimuscarinics] and spasmolytics, accidental (unintentional), initial encounter: Secondary | ICD-10-CM | POA: Diagnosis present

## 2018-05-01 DIAGNOSIS — Z880 Allergy status to penicillin: Secondary | ICD-10-CM

## 2018-05-01 DIAGNOSIS — I10 Essential (primary) hypertension: Secondary | ICD-10-CM | POA: Diagnosis present

## 2018-05-01 DIAGNOSIS — F419 Anxiety disorder, unspecified: Secondary | ICD-10-CM | POA: Diagnosis present

## 2018-05-01 DIAGNOSIS — Z885 Allergy status to narcotic agent status: Secondary | ICD-10-CM

## 2018-05-01 DIAGNOSIS — D75839 Thrombocytosis, unspecified: Secondary | ICD-10-CM | POA: Diagnosis present

## 2018-05-01 DIAGNOSIS — E119 Type 2 diabetes mellitus without complications: Secondary | ICD-10-CM

## 2018-05-01 DIAGNOSIS — T443X2A Poisoning by other parasympatholytics [anticholinergics and antimuscarinics] and spasmolytics, intentional self-harm, initial encounter: Principal | ICD-10-CM

## 2018-05-01 DIAGNOSIS — T1491XA Suicide attempt, initial encounter: Secondary | ICD-10-CM | POA: Diagnosis present

## 2018-05-01 HISTORY — DX: Essential (primary) hypertension: I10

## 2018-05-01 LAB — I-STAT BETA HCG BLOOD, ED (MC, WL, AP ONLY): I-stat hCG, quantitative: <5 m[IU]/mL (ref <5)

## 2018-05-01 LAB — RAPID URINE DRUG SCREEN, HOSP PERFORMED
Amphetamines: NOT DETECTED
Barbiturates: NOT DETECTED
Benzodiazepines: NOT DETECTED
Cocaine: NOT DETECTED
Opiates: NOT DETECTED
TETRAHYDROCANNABINOL: NOT DETECTED

## 2018-05-01 LAB — COMPREHENSIVE METABOLIC PANEL
ALBUMIN: 3.3 g/dL — AB (ref 3.5–5.0)
ALT: 16 U/L (ref 0–44)
AST: 20 U/L (ref 15–41)
Alkaline Phosphatase: 61 U/L (ref 38–126)
Anion gap: 9 (ref 5–15)
BILIRUBIN TOTAL: 1.2 mg/dL (ref 0.3–1.2)
BUN: 9 mg/dL (ref 6–20)
CO2: 26 mmol/L (ref 22–32)
Calcium: 8.8 mg/dL — ABNORMAL LOW (ref 8.9–10.3)
Chloride: 103 mmol/L (ref 98–111)
Creatinine, Ser: 1.03 mg/dL — ABNORMAL HIGH (ref 0.44–1.00)
GFR calc Af Amer: >60 mL/min (ref >60)
GFR calc non Af Amer: >60 mL/min (ref >60)
GLUCOSE: 156 mg/dL — AB (ref 70–99)
Potassium: 4.5 mmol/L (ref 3.5–5.1)
Sodium: 138 mmol/L (ref 135–145)
TOTAL PROTEIN: 7.2 g/dL (ref 6.5–8.1)

## 2018-05-01 LAB — SALICYLATE LEVEL: Salicylate Lvl: <7 mg/dL (ref 2.8–30.0)

## 2018-05-01 LAB — CBC
HEMATOCRIT: 44.2 % (ref 36.0–46.0)
Hemoglobin: 13.4 g/dL (ref 12.0–15.0)
MCH: 25.8 pg — ABNORMAL LOW (ref 26.0–34.0)
MCHC: 30.3 g/dL (ref 30.0–36.0)
MCV: 85 fL (ref 80.0–100.0)
Platelets: 501 10*3/uL — ABNORMAL HIGH (ref 150–400)
RBC: 5.2 MIL/uL — ABNORMAL HIGH (ref 3.87–5.11)
RDW: 14.4 % (ref 11.5–15.5)
WBC: 9.9 10*3/uL (ref 4.0–10.5)
nRBC: 0 % (ref 0.0–0.2)

## 2018-05-01 LAB — ACETAMINOPHEN LEVEL: Acetaminophen (Tylenol), Serum: <10 ug/mL — ABNORMAL LOW (ref 10–30)

## 2018-05-01 LAB — ETHANOL: Alcohol, Ethyl (B): <10 mg/dL (ref <10)

## 2018-05-01 MED ORDER — LORAZEPAM 2 MG/ML IJ SOLN: 1.0000 mg | INTRAMUSCULAR | Status: DC | PRN

## 2018-05-01 MED ORDER — ENOXAPARIN SODIUM 40 MG/0.4ML ~~LOC~~ SOLN
40.0000 mg | SUBCUTANEOUS | Status: DC
  Administered 2018-05-01 – 2018-05-02 (×2): 40 mg via SUBCUTANEOUS
  Filled 2018-05-01 (×2): qty 0.4

## 2018-05-01 MED ORDER — SODIUM CHLORIDE 0.9 % IV BOLUS
2000.0000 mL | Freq: Once | INTRAVENOUS | Status: AC
  Administered 2018-05-01: 2000 mL via INTRAVENOUS

## 2018-05-01 MED ORDER — SODIUM CHLORIDE 0.9 % IV SOLN
INTRAVENOUS | Status: AC
  Administered 2018-05-01: 21:00:00 via INTRAVENOUS

## 2018-05-01 MED ORDER — LORAZEPAM 2 MG/ML IJ SOLN
1.0000 mg | Freq: Once | INTRAMUSCULAR | Status: AC
  Administered 2018-05-01: 1 mg via INTRAVENOUS
  Filled 2018-05-01: qty 1

## 2018-05-01 NOTE — H&P (Signed)
History and Physical    Patricia Rodriguez [id number] DOB: 05-Sep-1965 DOA: 05/01/2018  PCP: Maude Leriche, PA-C Patient coming from: Home  Chief Complaint: Benadryl overdose  HPI: Patricia Rodriguez is a 53 y.o. female with medical history significant of anxiety, depression, type 2 diabetes, hypertension presenting to the hospital via EMS after she overdosed on Benadryl.  Patient states that around 12 PM this afternoon she took 22 tablets of Benadryl with intention to kill herself.  States she does not have a psychiatrist and has not taken her depression medication since she lost her job in 2018.  Reports previous history of suicide attempt in 2015 by overdosing on Tylenol PM.  At present, reports feeling sluggish but has no other complaints.  Denies any ethanol, tobacco, or illicit drug use.  States she was previously drinking only socially but has not done so in several years.  ED Course: Tachycardic and hypertensive.  Received IV fluid and Ativan.  Poison control contacted by ED.  Recommendation was to admit the patient if symptoms persisted over 6 hours.  Patient continued to be mildly confused and tachycardic after 6 hours.  UDS positive for benzos.  Acetaminophen level negative.  Salicylate level negative.  Blood ethanol level negative.  Review of Systems: As per HPI otherwise 10 point review of systems negative.  Past Medical History:  Diagnosis Date  . Anxiety   . Depression   . Diabetes mellitus without complication ([place])   . Headache(784.0)   . Hypertension   . IBS (irritable bowel syndrome)   . Mental disorder     History reviewed. No pertinent surgical history.   reports that she has never smoked. She has never used smokeless tobacco. She reports previous alcohol use. She reports that she does not use drugs.  Allergies  Allergen Reactions  . Oxycodone   . Penicillins Hives    Family History  Problem Relation Age of Onset  . Depression Maternal Aunt   .  Alcohol abuse Maternal Aunt   . Drug abuse Maternal Aunt   . Drug abuse Father   . Alcohol abuse Paternal Aunt   . Drug abuse Paternal Aunt   . Alcohol abuse Maternal Uncle   . Drug abuse Maternal Uncle   . Alcohol abuse Paternal Uncle   . Drug abuse Paternal Uncle     Prior to Admission medications   Medication Sig Start Date End Date Taking? Authorizing Provider  ALPRAZolam Duanne Moron) 1 MG tablet Take 1 mg by mouth 3 (three) times daily.    [provider]  Armodafinil (NUVIGIL PO) Take by mouth daily.    [provider]  buPROPion (WELLBUTRIN XL) 150 MG 24 hr tablet Take 150 mg by mouth daily.     [provider]  ciprofloxacin (CIPRO) 500 MG tablet Take 1 tablet (500 mg total) by mouth 2 (two) times daily. 10/27/15   Lysbeth Penner, FNP  dicyclomine (BENTYL) 20 MG tablet Take 20 mg by mouth as needed.    [provider]  HYDROcodone-acetaminophen (NORCO) 10-325 MG tablet Take 1 tablet by mouth every 8 (eight) hours as needed. 09/17/15   Wallene Huh, DPM  lurasidone (LATUDA) 40 MG TABS tablet Take by mouth 1 day or 1 dose. Take 60 mgQHS    [provider]  NUVIGIL 250 MG tablet  09/30/14   [provider]  pantoprazole (PROTONIX) 20 MG tablet Take 20 mg by mouth. Reported on 09/17/2015    [provider]  phenazopyridine (  PYRIDIUM) 200 MG tablet Take 1 tablet (200 mg total) by mouth 3 (three) times daily. 10/27/15   Lysbeth Penner, FNP    Physical Exam: Vitals:   05/01/18 1749 05/01/18 1800 05/01/18 1845 05/01/18 1900  BP: (!) 158/105 (!) 167/110 (!) 161/115   Pulse: (!) 109 (!) 104 (!) 104 (!) 103  Resp: (!) 23 (!) 28 (!) 24 16  Temp:      TempSrc:      SpO2: 99% 98% 97% 96%  Weight:      Height:        Physical Exam  Constitutional: She is oriented to person, place, and time. She appears well-developed and well-nourished. No distress.  HENT:  Head: Normocephalic.  Slightly dry mucous membranes  Eyes:    Pupils dilated but reactive to light  Neck: Neck supple.  Cardiovascular: Normal rate, regular rhythm and intact distal pulses.  Pulmonary/Chest: Effort normal and breath sounds normal. No respiratory distress. She has no wheezes. She has no rales.  Abdominal: Soft. Bowel sounds are normal. She exhibits no distension. There is no abdominal tenderness. There is no guarding.  Musculoskeletal:        General: No edema.  Neurological: She is alert and oriented to person, place, and time.  Appears slightly sluggish but answering questions appropriately and following commands.  Skin: Skin is warm and dry. She is not diaphoretic.     Labs on Admission: I have personally reviewed following labs and imaging studies  CBC: Recent Labs  Lab 05/01/18 1533  WBC 9.9  HGB 13.4  HCT 44.2  MCV 85.0  PLT 423*   Basic Metabolic Panel: Recent Labs  Lab 05/01/18 1533  NA 138  K 4.5  CL 103  CO2 26  GLUCOSE 156*  BUN 9  CREATININE 1.03*  CALCIUM 8.8*   GFR: Estimated Creatinine Clearance: 72.3 mL/min (A) (by C-G formula based on SCr of 1.03 mg/dL (H)). Liver Function Tests: Recent Labs  Lab 05/01/18 1533  AST 20  ALT 16  ALKPHOS 61  BILITOT 1.2  PROT 7.2  ALBUMIN 3.3*   No results for input(s): LIPASE, AMYLASE in the last 168 hours. No results for input(s): AMMONIA in the last 168 hours. Coagulation Profile: No results for input(s): INR, PROTIME in the last 168 hours. Cardiac Enzymes: No results for input(s): CKTOTAL, CKMB, CKMBINDEX, TROPONINI in the last 168 hours. BNP (last 3 results) No results for input(s): PROBNP in the last 8760 hours. HbA1C: No results for input(s): HGBA1C in the last 72 hours. CBG: No results for input(s): GLUCAP in the last 168 hours. Lipid Profile: No results for input(s): CHOL, HDL, LDLCALC, TRIG, CHOLHDL, LDLDIRECT in the last 72 hours. Thyroid Function Tests: No results for input(s): TSH, T4TOTAL, FREET4, T3FREE, THYROIDAB in the last 72  hours. Anemia Panel: No results for input(s): VITAMINB12, FOLATE, FERRITIN, TIBC, IRON, RETICCTPCT in the last 72 hours. Urine analysis:    Component Value Date/Time   COLORURINE YELLOW 07/01/2014 1238   APPEARANCEUR CLEAR 07/01/2014 1238   LABSPEC 1.015 10/27/2015 1859   PHURINE 6.0 10/27/2015 1859   GLUCOSEU NEGATIVE 10/27/2015 1859   HGBUR TRACE (A) 10/27/2015 1859   BILIRUBINUR NEGATIVE 10/27/2015 1859   KETONESUR NEGATIVE 10/27/2015 1859   PROTEINUR NEGATIVE 10/27/2015 1859   UROBILINOGEN 0.2 10/27/2015 1859   NITRITE POSITIVE (A) 10/27/2015 1859   LEUKOCYTESUR SMALL (A) 10/27/2015 1859    Radiological Exams on Admission: No results found.  EKG: Independently reviewed.  Sinus tachycardia, QRS 84,  QTc 399.  Assessment/Plan Principal Problem:   Anticholinergic drug overdose Active Problems:   Suicide attempt ([place])   Thrombocytosis ([place])   HTN (hypertension)   Type 2 diabetes mellitus ([place])  Anticholinergic overdose; suicide attempt -She has a history of depression but has not been on any medications since 2018.  Attempted suicide today by taking 22 tablets of Benadryl 25 mg.  Appears slightly sluggish on exam but answering questions appropriately and following commands. AAO x3. -Poison control contacted by ED. -IV fluid hydration -Ativan PRN agitation, seizures, tachycardia -Watch out for urinary retention.  Monitor urine output -Watch out for agitation, delirium, seizures, and sedation. -EKG showing sinus tachycardia, QRS 84, QTc 399.  Continue to monitor QRS and QTc.  Keep patient on cardiac monitoring.  Cycle EKGs. -Psych consult has been placed.  Chronic thrombocytosis Platelet count 501. -Continue to monitor; CBC in a.m.  Hypertension Currently not on any medications.  Blood pressure currently elevated in the setting of anticholinergic overdose. -Continue to monitor; management of anticholinergic overdose as above  Type 2 diabetes Currently not on any  medications. -Check A1c.  CBG monitoring.  DVT prophylaxis: Lovenox Code Status: Full code Family Communication: No family available Disposition Plan: Pending psychiatry evaluation. Consults called: Psychiatry Admission status: Observation, cardiac telemetry   Shela Leff MD [facility] Pager [phone]  If 7PM-7AM, please contact night-coverage [email or web address] Password [facility]  05/01/2018, 7:09 PM

## 2018-05-01 NOTE — ED Notes (Signed)
Attempted report 

## 2018-05-01 NOTE — ED Notes (Signed)
No further orders at this time per provider, pt is okay to go to the floor

## 2018-05-01 NOTE — [place] Notes (Signed)
Pt. With elevated BP per ED RN. MD informed. No new orders at this time per ED RN. Continue to monitor BP.

## 2018-05-01 NOTE — ED Notes (Signed)
Pt not taking bp meds at home for 3 years she reports. Hx of htn.

## 2018-05-01 NOTE — ED Triage Notes (Signed)
Pt from home via ems; SI, called out in refreence to taking 24 25 mg pills (approx 600 mg) benadryl; c/o tremors; did not vomit any pills up; poison control has been contacted by ems; hx htn; supposed to be taking Wellbutrin but has not been taking meds; hx of similar event in 2015  220/120 HR 130 sinus tach CBG 170 96% RA

## 2018-05-01 NOTE — ED Notes (Signed)
Spoke w/ Patty from poison control; Per Patty, along w/ hyperthermia, HTN, and tachycardia, possibility of urinary retention, agitation, delirium, seizures, and sedation; possible conduction delays, including QRS and QTc; recommended cardiac monitoring and and EKG STAT and prior to medical clearance; minimum of 6 hour monitoring from time of ingestion; recommend admit if symptoms do not resolve int he 6 hours; manage w/ fluids and benzos; Dr. Tyrone Nine aware of recommendations per Poison control

## 2018-05-01 NOTE — ED Provider Notes (Signed)
Patricia Rodriguez Provider Note   CSN: [id number] Arrival date & time: 05/01/18  1514     History   Chief Complaint No chief complaint on file.   HPI Patricia Rodriguez is a 53 y.o. female.  53 yo F with a chief complaint of intentional Benadryl overdose.  States she took about 22 25 mg Benadryl tablets around 11:00.  Since then she has felt like her heart is been racing.  She denies any nausea or vomiting.  Denies any coingestants.  She denies Tylenol alcohol illegal drugs or aspirin.  The history is provided by the patient.  Illness  Severity:  Moderate Onset quality:  Sudden Duration:  4 hours Timing:  Constant Progression:  Worsening Chronicity:  Recurrent Associated symptoms: no chest pain, no congestion, no fever, no headaches, no myalgias, no nausea, no rhinorrhea, no shortness of breath, no vomiting and no wheezing     Past Medical History:  Diagnosis Date  . Anxiety   . Depression   . Diabetes mellitus without complication ([place])   . Headache(784.0)   . Hypertension   . IBS (irritable bowel syndrome)   . Mental disorder     Patient Active Problem List   Diagnosis Date Noted  . Anticholinergic drug overdose 05/01/2018  . Suicide attempt ([name]) 05/01/2018  . Thrombocytosis ([place]) 05/01/2018  . HTN (hypertension) 05/01/2018  . Type 2 diabetes mellitus ([place]) 05/01/2018  . Severe recurrent major depression without psychotic features ([place]) 07/11/2014  . Hypokalemia 07/02/2014  . Overdose of benzodiazepine 07/01/2014  . Bipolar disorder ([place]) 07/01/2014  . Suicidal ideation 07/01/2014  . UTI (urinary tract infection) 07/01/2014  . ONYCHOMYCOSIS 06/16/2006  . Obesity, morbid ([place]) 06/16/2006  . INCONTINENCE, STRESS, FEMALE 06/16/2006  . DYSFUNCTIONAL UTERINE BLEEDING 06/16/2006  . ECZEMA, ATOPIC DERMATITIS 06/16/2006    History reviewed. No pertinent surgical history.   OB History   No obstetric history on file.       Home Medications    Prior to Admission medications   Medication Sig Start Date End Date Taking? Authorizing Provider  ALPRAZolam Duanne Moron) 1 MG tablet Take 1 mg by mouth 3 (three) times daily.    [provider]  Armodafinil (NUVIGIL PO) Take by mouth daily.    [provider]  buPROPion (WELLBUTRIN XL) 150 MG 24 hr tablet Take 150 mg by mouth daily.     [provider]  ciprofloxacin (CIPRO) 500 MG tablet Take 1 tablet (500 mg total) by mouth 2 (two) times daily. 10/27/15   Lysbeth Penner, FNP  dicyclomine (BENTYL) 20 MG tablet Take 20 mg by mouth as needed.    [provider]  HYDROcodone-acetaminophen (NORCO) 10-325 MG tablet Take 1 tablet by mouth every 8 (eight) hours as needed. 09/17/15   Wallene Huh, DPM  lurasidone (LATUDA) 40 MG TABS tablet Take by mouth 1 day or 1 dose. Take 60 mgQHS    [provider]  NUVIGIL 250 MG tablet  09/30/14   [provider]  pantoprazole (PROTONIX) 20 MG tablet Take 20 mg by mouth. Reported on 09/17/2015    [provider]  phenazopyridine (PYRIDIUM) 200 MG tablet Take 1 tablet (200 mg total) by mouth 3 (three) times daily. 10/27/15   Lysbeth Penner, FNP    Family History Family History  Problem Relation Age of Onset  . Depression Maternal Aunt   . Alcohol abuse Maternal Aunt   . Drug abuse Maternal Aunt   . Drug  abuse Father   . Alcohol abuse Paternal Aunt   . Drug abuse Paternal Aunt   . Alcohol abuse Maternal Uncle   . Drug abuse Maternal Uncle   . Alcohol abuse Paternal Uncle   . Drug abuse Paternal Uncle     Social History Social History   Tobacco Use  . Smoking status: Never Smoker  . Smokeless tobacco: Never Used  Substance Use Topics  . Alcohol use: Not Currently    Alcohol/week: 0.0 standard drinks    Comment: socially  . Drug use: No     Allergies   Oxycodone and Penicillins   Review of Systems Review of Systems  Constitutional: Negative for  chills and fever.  HENT: Negative for congestion and rhinorrhea.   Eyes: Negative for redness and visual disturbance.  Respiratory: Negative for shortness of breath and wheezing.   Cardiovascular: Positive for palpitations. Negative for chest pain.  Gastrointestinal: Negative for nausea and vomiting.  Genitourinary: Negative for dysuria and urgency.  Musculoskeletal: Negative for arthralgias and myalgias.  Skin: Negative for pallor and wound.  Neurological: Negative for dizziness and headaches.     Physical Exam Updated Vital Signs BP (!) 167/107 (BP Location: Left Arm)   Pulse (!) 110   Temp 98.3 F (36.8 C) (Oral)   Resp 18   Ht 5' (1.524 m)   Wt 108.9 kg   SpO2 100%   BMI 46.87 kg/m   Physical Exam Vitals signs and nursing note reviewed.  Constitutional:      General: She is not in acute distress.    Appearance: She is well-developed. She is not diaphoretic.  HENT:     Head: Normocephalic and atraumatic.     Mouth/Throat:     Mouth: Mucous membranes are dry.  Eyes:     Pupils: Pupils are equal, round, and reactive to light.  Neck:     Musculoskeletal: Normal range of motion and neck supple.  Cardiovascular:     Rate and Rhythm: Regular rhythm. Tachycardia present.     Heart sounds: No murmur. No friction rub. No gallop.   Pulmonary:     Effort: Pulmonary effort is normal.     Breath sounds: No wheezing or rales.  Abdominal:     General: There is no distension.     Palpations: Abdomen is soft.     Tenderness: There is no abdominal tenderness.  Musculoskeletal:        General: No tenderness.  Skin:    General: Skin is warm and dry.  Neurological:     Mental Status: She is alert and oriented to person, place, and time.  Psychiatric:        Behavior: Behavior normal.      ED Treatments / Results  Labs (all labs ordered are listed, but only abnormal results are displayed) Labs Reviewed  COMPREHENSIVE METABOLIC PANEL - Abnormal; Notable for the following  components:      Result Value   Glucose, Bld 156 (*)    Creatinine, Ser 1.03 (*)    Calcium 8.8 (*)    Albumin 3.3 (*)    All other components within normal limits  ACETAMINOPHEN LEVEL - Abnormal; Notable for the following components:   Acetaminophen (Tylenol), Serum <10 (*)    All other components within normal limits  CBC - Abnormal; Notable for the following components:   RBC 5.20 (*)    MCH 25.8 (*)    Platelets 501 (*)    All other components within normal limits  ETHANOL  SALICYLATE LEVEL  RAPID URINE DRUG SCREEN, HOSP PERFORMED  HIV ANTIBODY (ROUTINE TESTING W REFLEX)  CBC  HEMOGLOBIN A1C  I-STAT BETA HCG BLOOD, ED ([facility], [facility], [facility] ONLY)    EKG EKG Interpretation  Date/Time:  Monday May 01 2018 16:14:38 EST Ventricular Rate:  106 PR Interval:    QRS Duration: 86 QT Interval:  327 QTC Calculation: 435 R Axis:   25 Text Interpretation:  Sinus tachycardia Abnormal R-wave progression, early transition LVH with secondary repolarization abnormality No significant change since last tracing Confirmed by Deno Etienne [phone]) on 05/01/2018 4:20:12 PM   Radiology No results found.  Procedures Procedures (including critical care time)  Medications Ordered in ED Medications  LORazepam (ATIVAN) injection 1 mg (has no administration in time range)  0.9 %  sodium chloride infusion ( Intravenous Restarted 05/01/18 2232)  enoxaparin (LOVENOX) injection 40 mg (40 mg Subcutaneous Given 05/01/18 2102)  sodium chloride 0.9 % bolus 2,000 mL (0 mLs Intravenous Stopped 05/01/18 1747)  LORazepam (ATIVAN) injection 1 mg (1 mg Intravenous Given 05/01/18 1559)     Initial Impression / Assessment and Plan / ED Course  I have reviewed the triage vital signs and the nursing notes.  Pertinent labs & imaging results that were available during my care of the patient were reviewed by me and considered in my medical decision making (see chart for details).     53 yo F with a chief complaint of  an intentional Benadryl ingestion.  Patient somewhat slow to respond to may be hallucinating.  She is tachycardic and hypertensive.  EKG with a QRS of less than 100.  We will give a bolus of IV fluids will give a dose of Ativan.  Will discuss with poison control.  Poison control recommended that the patient be admitted to the hospital if the patient had symptoms over 6 hours.  Patient was reassessed at 5 PM which was 6 hours post she is still mildly confused and tachycardic.  She does feel a bit better.  Will discuss with the hospitalist for admission.  CRITICAL CARE Performed by: Cecilio Asper   Total critical care time: 35 minutes  Critical care time was exclusive of separately billable procedures and treating other patients.  Critical care was necessary to treat or prevent imminent or life-threatening deterioration.  Critical care was time spent personally by me on the following activities: development of treatment plan with patient and/or surrogate as well as nursing, discussions with consultants, evaluation of patient's response to treatment, examination of patient, obtaining history from patient or surrogate, ordering and performing treatments and interventions, ordering and review of laboratory studies, ordering and review of radiographic studies, pulse oximetry and re-evaluation of patient's condition.  The patients results and plan were reviewed and discussed.   Any x-rays performed were independently reviewed by myself.   Differential diagnosis were considered with the presenting HPI.  Medications  LORazepam (ATIVAN) injection 1 mg (has no administration in time range)  0.9 %  sodium chloride infusion ( Intravenous Restarted 05/01/18 2232)  enoxaparin (LOVENOX) injection 40 mg (40 mg Subcutaneous Given 05/01/18 2102)  sodium chloride 0.9 % bolus 2,000 mL (0 mLs Intravenous Stopped 05/01/18 1747)  LORazepam (ATIVAN) injection 1 mg (1 mg Intravenous Given 05/01/18 1559)     Vitals:   05/01/18 1930 05/01/18 1945 05/01/18 2000 05/01/18 2037  BP: (!) 154/110 (!) 145/101 (!) 154/108 (!) 167/107  Pulse: (!) 103 (!) 101 (!) 102 (!) 110  Resp: (!) 23 20  18 18  Temp:    98.3 F (36.8 C)  TempSrc:    Oral  SpO2: 99% 99% 100% 100%  Weight:      Height:        Final diagnoses:  Anticholinergic drug overdose, intentional self-harm, initial encounter [facility])    Admission/ observation were discussed with the admitting physician, patient and/or family and they are comfortable with the plan.   Final Clinical Impressions(s) / ED Diagnoses   Final diagnoses:  Anticholinergic drug overdose, intentional self-harm, initial encounter [facility])    ED Discharge Orders    None       Deno Etienne, DO 05/01/18 2321

## 2018-05-02 ENCOUNTER — Encounter (HOSPITAL_COMMUNITY): Payer: Self-pay | Admitting: General Practice

## 2018-05-02 ENCOUNTER — Other Ambulatory Visit: Payer: Self-pay

## 2018-05-02 DIAGNOSIS — D473 Essential (hemorrhagic) thrombocythemia: Secondary | ICD-10-CM

## 2018-05-02 DIAGNOSIS — T450X2A Poisoning by antiallergic and antiemetic drugs, intentional self-harm, initial encounter: Secondary | ICD-10-CM

## 2018-05-02 DIAGNOSIS — I10 Essential (primary) hypertension: Secondary | ICD-10-CM

## 2018-05-02 DIAGNOSIS — T1491XA Suicide attempt, initial encounter: Secondary | ICD-10-CM

## 2018-05-02 LAB — BASIC METABOLIC PANEL
Anion gap: 7 (ref 5–15)
BUN: 6 mg/dL (ref 6–20)
CO2: 25 mmol/L (ref 22–32)
Calcium: 8.1 mg/dL — ABNORMAL LOW (ref 8.9–10.3)
Chloride: 107 mmol/L (ref 98–111)
Creatinine, Ser: 0.97 mg/dL (ref 0.44–1.00)
GFR calc Af Amer: >60 mL/min (ref >60)
GFR calc non Af Amer: >60 mL/min (ref >60)
Glucose, Bld: 122 mg/dL — ABNORMAL HIGH (ref 70–99)
Potassium: 3.7 mmol/L (ref 3.5–5.1)
Sodium: 139 mmol/L (ref 135–145)

## 2018-05-02 LAB — HEMOGLOBIN A1C
Hgb A1c MFr Bld: 7.6 % — ABNORMAL HIGH (ref 4.8–5.6)
Mean Plasma Glucose: 171.42 mg/dL

## 2018-05-02 LAB — GLUCOSE, CAPILLARY
GLUCOSE-CAPILLARY: 128 mg/dL — AB (ref 70–99)
Glucose-Capillary: 128 mg/dL — ABNORMAL HIGH (ref 70–99)
Glucose-Capillary: 93 mg/dL (ref 70–99)
Glucose-Capillary: 140 mg/dL — ABNORMAL HIGH (ref 70–99)

## 2018-05-02 LAB — CBC
HCT: 39.8 % (ref 36.0–46.0)
Hemoglobin: 11.9 g/dL — ABNORMAL LOW (ref 12.0–15.0)
MCH: 25.8 pg — ABNORMAL LOW (ref 26.0–34.0)
MCHC: 29.9 g/dL — ABNORMAL LOW (ref 30.0–36.0)
MCV: 86.3 fL (ref 80.0–100.0)
Platelets: 422 10*3/uL — ABNORMAL HIGH (ref 150–400)
RBC: 4.61 MIL/uL (ref 3.87–5.11)
RDW: 14.7 % (ref 11.5–15.5)
WBC: 11 10*3/uL — ABNORMAL HIGH (ref 4.0–10.5)
nRBC: 0 % (ref 0.0–0.2)

## 2018-05-02 LAB — HIV ANTIBODY (ROUTINE TESTING W REFLEX): HIV SCREEN 4TH GENERATION: NONREACTIVE

## 2018-05-02 MED ORDER — AMLODIPINE BESYLATE 2.5 MG PO TABS
5.0000 mg | ORAL_TABLET | Freq: Every day | ORAL | Status: DC
  Administered 2018-05-02: 5 mg via ORAL
  Filled 2018-05-02 (×2): qty 2

## 2018-05-02 MED ORDER — BIOTENE DRY MOUTH MT LIQD: 15.0000 mL | OROMUCOSAL | Status: DC | PRN

## 2018-05-02 MED ORDER — SODIUM CHLORIDE 0.9 % IV SOLN: INTRAVENOUS | Status: AC

## 2018-05-02 NOTE — Plan of Care (Signed)
?  Problem: Coping: ?Goal: Level of anxiety will decrease ?Outcome: Progressing ?  ?Problem: Safety: ?Goal: Ability to remain free from injury will improve ?Outcome: Progressing ?  ?

## 2018-05-02 NOTE — Progress Notes (Signed)
[facility] PROGRESS NOTE  Patricia Rodriguez ION:[id number] DOB: 19-Aug-1965 DOA: 05/01/2018 PCP: Maude Leriche, PA-C  Assessment/Plan:  Anticholinergic overdose; suicide attempt -She has a history of depression but has not been on any medications since 2018.  Attempted suicide 05/01/18 by taking 22 tablets of Benadryl 25 mg. AAO x3 this am. Poison control contacted by ED recommended fluids, monitoring for urine retention and QT. Serial EKG's within limits on normal.  -IV fluid hydration -Ativan PRN agitation, seizures, tachycardia -obtain post void bladder scan -await Psych input -Patient is medically ready for transfer to inpatient [facility] facility if recommended   Chronic thrombocytosis Platelet count 422. Trending down this am -OP follow up  Hypertension. BP high end of normal.  Currently not on any medications.  -add norvasc -Continue to monitor  Type 2 diabetes. A1c 7.2.  Currently not on any medications. -  CBG 128 this am   Code Status: full Family Communication: none present Disposition Plan: to be determined   Consultants:  BH  Procedures:    Antibiotics:    HPI/Subjective: 53 yo pmh includes DM, htn, anxiety, depression, suicide attempt admitted for suicide attempt with benadryl. She has been off all medication for over a year due to finances according to patient  Feeling "better" this am. Denies pain/discomfort  Objective: Vitals:   05/02/18 0330 05/02/18 0832  BP: (!) 163/100 (!) 143/89  Pulse: 96 85  Resp: 16 17  Temp: 98.3 F (36.8 C) 98.7 F (37.1 C)  SpO2: 100% 97%    Intake/Output Summary (Last 24 hours) at 05/02/2018 1022 Last data filed at 05/02/2018 0900 Gross per 24 hour  Intake 2840.54 ml  Output 800 ml  Net 2040.54 ml   Filed Weights   05/01/18 1523 05/01/18 2037 05/02/18 0510  Weight: 108.9 kg 107.7 kg 108.9 kg    Exam:   General:  Awake alert visiting with sitter no acute distress  Cardiovascular: rrr no mgr  no LE edema  Respiratory: normal effort BS distant but clear no wheeze or rhonchi  Abdomen: obese soft +BS no guarding or rebounding  Musculoskeletal: joints without swelling/erythema  Psy: calm, cooperative, smiling   Data Reviewed: Basic Metabolic Panel: Recent Labs  Lab 05/01/18 1533  NA 138  K 4.5  CL 103  CO2 26  GLUCOSE 156*  BUN 9  CREATININE 1.03*  CALCIUM 8.8*   Liver Function Tests: Recent Labs  Lab 05/01/18 1533  AST 20  ALT 16  ALKPHOS 61  BILITOT 1.2  PROT 7.2  ALBUMIN 3.3*   No results for input(s): LIPASE, AMYLASE in the last 168 hours. No results for input(s): AMMONIA in the last 168 hours. CBC: Recent Labs  Lab 05/01/18 1533 05/02/18 0435  WBC 9.9 11.0*  HGB 13.4 11.9*  HCT 44.2 39.8  MCV 85.0 86.3  PLT 501* 422*   Cardiac Enzymes: No results for input(s): CKTOTAL, CKMB, CKMBINDEX, TROPONINI in the last 168 hours. BNP (last 3 results) No results for input(s): BNP in the last 8760 hours.  ProBNP (last 3 results) No results for input(s): PROBNP in the last 8760 hours.  CBG: Recent Labs  Lab 05/02/18 0744  GLUCAP 128*    No results found for this or any previous visit (from the past 240 hour(s)).   Studies: No results found.  Scheduled Meds: . enoxaparin (LOVENOX) injection  40 mg Subcutaneous Q24H   Continuous Infusions: . sodium chloride      Principal Problem:   Suicide attempt [facility]) Active Problems:  Anticholinergic drug overdose   Thrombocytosis ([facility])   HTN (hypertension)   Type 2 diabetes mellitus ([place])    Time spent: 67 minutes    [place] NP  [facility]  If 7PM-7AM, please contact night-coverage at [email or web address], password [facility] 05/02/2018, 10:22 AM  LOS: 0 days

## 2018-05-02 NOTE — Progress Notes (Addendum)
Nutrition Brief Note  Patient identified on the Malnutrition Screening Tool (MST) Report  Wt Readings from Last 15 Encounters:  05/02/18 108.9 kg  01/29/16 119.7 kg  09/17/15 117.5 kg  03/28/15 124.7 kg  12/11/14 117.7 kg  12/03/14 116.7 kg  07/01/14 115.3 kg   Patricia Rodriguez is a 53 y.o. female here after an intentional drug overdose with benadryl.   Await psychiatry consultation.  Medically ready to go to facility if recommended.  Patient has plans to harm herself if she leaves here.  Pt admitted with suicidal ideations. She is medically stable for discharge and awaiting a bed at [facility].   Spoke with pt at bedside, who reports a decreased appetite over the past year. Of note, per chart review, pt lost her jo over a year ago and has not had access of any of her medications since then. RD also suspects food insecurity. Pt shares she typically consumes 2 meals per day, which consist of microwaved beef tips, instant mashed potatoes, or hot dogs. Beverages usually consist of tea, juice, or mountain dew.   Pt denies any recent wt loss. She suspects she may have lost about 6 pounds within the past year. She estimates her UBW is around 240#.   Pt has been consuming 100% of meals since admitted.   Nutrition-Focused physical exam completed. Findings are no fat depletion, no muscle depletion, and no edema.    Body mass index is 46.87 kg/m. Patient meets criteria for extreme obesity, class III based on current BMI.   Current diet order is Heart Healthy, patient is consuming approximately 100% of meals at this time. Labs and medications reviewed.   No nutrition interventions warranted at this time. If nutrition issues arise, please consult RD.   Patricia Rodriguez A. Jimmye Norman, RD, LDN, CDE Pager: [phone] After hours Pager: [phone]

## 2018-05-02 NOTE — Consult Note (Signed)
[facility] Face-to-Face Psychiatry Consult   Reason for Consult:  Suicide attempt  Referring Physician:  Dr. Eliseo Squires Patient Identification: Patricia Rodriguez MRN:  [id number] Principal Diagnosis: Suicide attempt [facility]) Diagnosis:  Principal Problem:   Suicide attempt [facility]) Active Problems:   Anticholinergic drug overdose   Thrombocytosis ([place])   HTN (hypertension)   Type 2 diabetes mellitus ([place])   Total Time spent with patient: 1 hour  Subjective:   Patricia Rodriguez is a 53 y.o. female patient admitted with suicide attempt by drug overdose.  HPI:   Per chart review, patient was admitted with suicide attempt by Benadryl overdose. She reportedly took 22 tablets of Benadryl 25 mg. She has a history of depression and anxiety. Home medications include Xanax 1 mg TID, Wellbutrin XL 150 mg daily and Latuda 60 mg qhs. PMP indicates that she was previously prescribed Xanax 1 mg TID by Patricia Rodriguez. She last filled a prescription for Xanax in 05/2017. BAL and UDS were negative on admission.   On interview, Patricia Rodriguez reports, "I am bipolar. I have bipolar depression. I became agoraphobic 2 years ago and due to isolation this has caused worsening depression." She endorses SI for the past year. She reports that she recently found out that her disability was denied which caused her depression and suicidal thoughts to worsen so she acted on them. Her mother helps by regularly bringing her groceries. She recently requested Benadryl from the store. She reports that her mother may have suspected that she may harm herself because she took some of the Benadryl. She called 911 a while after her ingestion because she became anxious due to shortness of breath. She endorses current SI. She reports that she wishes that she completed her attempt. She reports that she also told her doctors that she wanted to harm herself. She removed knives from her home because she did not want it to be a "violent suicide attempt." She  reports promising her mother that she would never harm herself again. She denies HI or AVH. She reports poor appetite and poor sleep prior to hospitalization. She has been sleeping 4 hours nightly. She has been neglecting her self-care. 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 She has not been able to follow up with her medical and psychiatric providers. She has not taken psychiatric medications since 08/2016. She did not feel like they were helpful. She reports a history of manic symptoms. She reports "pressured speech for one day when she was around others."   Past Psychiatric History: BPAD, depression, anxiety and prior history of suicide attempts (overdose in 2005, 2006 and 2016).   Risk to Self:  Yes given recent suicide attempt.  Risk to Others:  None. Denies HI. Prior Inpatient Therapy:  She was last hospitalized in 2016 after suicide attempt by overdose.  Prior Outpatient Therapy:  Patricia Rodriguez at [facility]. She completed therapy in 04/2016 and found it helpful.   Past Medical History:  Past Medical History:  Diagnosis Date  . Anxiety   . Depression   . Diabetes mellitus without complication ([place])   . Headache(784.0)   . Hypertension   . IBS (irritable bowel syndrome)   . Mental disorder     Past Surgical History:  Procedure Laterality Date  . NO PAST SURGERIES     Family History:  Family History  Problem Relation Age of Onset  . Depression Maternal Aunt   . Alcohol abuse Maternal  Aunt   . Drug abuse Maternal Aunt   . Drug abuse Father   . Alcohol abuse Paternal Aunt   . Drug abuse Paternal Aunt   . Alcohol abuse Maternal Uncle   . Drug abuse Maternal Uncle   . Alcohol abuse Paternal Uncle   . Drug abuse Paternal Uncle    Family Psychiatric  History: As listed above and paternal aunt-attempted suicide.  Social History:  Social History   Substance and Sexual Activity  Alcohol Use Not Currently  .  Alcohol/week: 0.0 standard drinks   Comment: socially     Social History   Substance and Sexual Activity  Drug Use No    Social History   Socioeconomic History  . Marital status: Divorced    Spouse name: Not on file  . Number of children: Not on file  . Years of education: Not on file  . Highest education level: Not on file  Occupational History  . Not on file  Social Needs  . Financial resource strain: Not on file  . Food insecurity:    Worry: Not on file    Inability: Not on file  . Transportation needs:    Medical: Not on file    Non-medical: Not on file  Tobacco Use  . Smoking status: Never Smoker  . Smokeless tobacco: Never Used  Substance and Sexual Activity  . Alcohol use: Not Currently    Alcohol/week: 0.0 standard drinks    Comment: socially  . Drug use: No  . Sexual activity: Not Currently  Lifestyle  . Physical activity:    Days per week: Not on file    Minutes per session: Not on file  . Stress: Not on file  Relationships  . Social connections:    Talks on phone: Not on file    Gets together: Not on file    Attends religious service: Not on file    Active member of club or organization: Not on file    Attends meetings of clubs or organizations: Not on file    Relationship status: Not on file  Other Topics Concern  . Not on file  Social History Narrative  . Not on file   Additional Social History: She lives at home alone. She is divorced. She recently lost her job at [employer]. She denies alcohol or illicit substance use.     Allergies:   Allergies  Allergen Reactions  . Oxycodone   . Penicillins Hives    Labs:  Results for orders placed or performed during the hospital encounter of 05/01/18 (from the past 48 hour(s))  Rapid urine drug screen (hospital performed)     Status: None   Collection Time: 05/01/18  3:28 PM  Result Value Ref Range   Opiates NONE DETECTED NONE DETECTED   Cocaine NONE DETECTED NONE DETECTED   Benzodiazepines NONE  DETECTED NONE DETECTED   Amphetamines NONE DETECTED NONE DETECTED   Tetrahydrocannabinol NONE DETECTED NONE DETECTED   Barbiturates NONE DETECTED NONE DETECTED    Comment: (NOTE) DRUG SCREEN FOR MEDICAL PURPOSES ONLY.  IF CONFIRMATION IS NEEDED FOR ANY PURPOSE, NOTIFY LAB WITHIN 5 DAYS. LOWEST DETECTABLE LIMITS FOR URINE DRUG SCREEN Drug Class                     Cutoff (ng/mL) Amphetamine and metabolites    1000 Barbiturate and metabolites    200 Benzodiazepine                 200  Tricyclics and metabolites     300 Opiates and metabolites        300 Cocaine and metabolites        300 THC                            50 Performed at [facility], [place] [address] [place], [place] [address]   Comprehensive metabolic panel     Status: Abnormal   Collection Time: 05/01/18  3:33 PM  Result Value Ref Range   Sodium 138 135 - 145 mmol/L   Potassium 4.5 3.5 - 5.1 mmol/L   Chloride 103 98 - 111 mmol/L   CO2 26 22 - 32 mmol/L   Glucose, Bld 156 (H) 70 - 99 mg/dL   BUN 9 6 - 20 mg/dL   Creatinine, Ser 1.03 (H) 0.44 - 1.00 mg/dL   Calcium 8.8 (L) 8.9 - 10.3 mg/dL   Total Protein 7.2 6.5 - 8.1 g/dL   Albumin 3.3 (L) 3.5 - 5.0 g/dL   AST 20 15 - 41 U/L   ALT 16 0 - 44 U/L   Alkaline Phosphatase 61 38 - 126 U/L   Total Bilirubin 1.2 0.3 - 1.2 mg/dL   GFR calc non Af Amer >60 >60 mL/min   GFR calc Af Amer >60 >60 mL/min   Anion gap 9 5 - 15    Comment: Performed at [facility] [address] [place], [place] [address]  Ethanol     Status: None   Collection Time: 05/01/18  3:33 PM  Result Value Ref Range   Alcohol, Ethyl (B) <10 <10 mg/dL    Comment: (NOTE) Lowest detectable limit for serum alcohol is 10 mg/dL. For medical purposes only. Performed at [facility], [place] [address], [place],  [address]   Salicylate level     Status: None   Collection Time: 05/01/18  3:33 PM  Result Value Ref Range   Salicylate Lvl <2.0 2.8 - 30.0 mg/dL    Comment:  Performed at [facility] [address] [place],  [address]  Acetaminophen level     Status: Abnormal   Collection Time: 05/01/18  3:33 PM  Result Value Ref Range   Acetaminophen (Tylenol), Serum <10 (L) 10 - 30 ug/mL    Comment: (NOTE) Therapeutic concentrations vary significantly. A range of 10-30 ug/mL  may be an effective concentration for many patients. However, some  are best treated at concentrations outside of this range. Acetaminophen concentrations >150 ug/mL at 4 hours after ingestion  and >50 ug/mL at 12 hours after ingestion are often associated with  toxic reactions. Performed at [facility], [place] [address], [place], [place] [address]   cbc     Status: Abnormal   Collection Time: 05/01/18  3:33 PM  Result Value Ref Range   WBC 9.9 4.0 - 10.5 K/uL   RBC 5.20 (H) 3.87 - 5.11 MIL/uL   Hemoglobin 13.4 12.0 - 15.0 g/dL   HCT 44.2 36.0 - 46.0 %   MCV 85.0 80.0 - 100.0 fL   MCH 25.8 (L) 26.0 - 34.0 pg   MCHC 30.3 30.0 - 36.0 g/dL   RDW 14.4 11.5 - 15.5 %   Platelets 501 (H) 150 - 400 K/uL   nRBC 0.0 0.0 - 0.2 %    Comment: Performed at [facility], [place] [address] [place],  [address]  I-Stat beta hCG blood, ED     Status: None   Collection Time: 05/01/18  3:38 PM  Result Value Ref Range   I-stat hCG, quantitative <5.0 <5 mIU/mL   Comment 3            Comment:   GEST. AGE      CONC.  (mIU/mL)   <=1 WEEK        5 - 50     2 WEEKS       50 - 500     3 WEEKS       100 - 10,000     4 WEEKS     1,000 - 30,000        FEMALE AND NON-PREGNANT FEMALE:     LESS THAN 5 mIU/mL   HIV antibody (Routine Testing)     Status: None   Collection Time: 05/02/18  4:35 AM  Result Value Ref Range   HIV Screen 4th Generation wRfx Non Reactive Non Reactive    Comment: (NOTE) Performed At: [facility] [place], [place] [id number] Rush Farmer MD IN:[id number]   CBC     Status: Abnormal   Collection Time: 05/02/18  4:35 AM   Result Value Ref Range   WBC 11.0 (H) 4.0 - 10.5 K/uL   RBC 4.61 3.87 - 5.11 MIL/uL   Hemoglobin 11.9 (L) 12.0 - 15.0 g/dL   HCT 39.8 36.0 - 46.0 %   MCV 86.3 80.0 - 100.0 fL   MCH 25.8 (L) 26.0 - 34.0 pg   MCHC 29.9 (L) 30.0 - 36.0 g/dL   RDW 14.7 11.5 - 15.5 %   Platelets 422 (H) 150 - 400 K/uL   nRBC 0.0 0.0 - 0.2 %    Comment: Performed at [facility], [place] [address] [place], [place] [address]  Hemoglobin A1c     Status: Abnormal   Collection Time: 05/02/18  4:35 AM  Result Value Ref Range   Hgb A1c MFr Bld 7.6 (H) 4.8 - 5.6 %    Comment: (NOTE) Pre diabetes:          5.7%-6.4% Diabetes:              >6.4% Glycemic control for   <7.0% adults with diabetes    Mean Plasma Glucose 171.42 mg/dL    Comment: Performed at [facility] [address], [place], [place] [address]  Glucose, capillary     Status: Abnormal   Collection Time: 05/02/18  7:44 AM  Result Value Ref Range   Glucose-Capillary 128 (H) 70 - 99 mg/dL   Comment 1 Notify RN    Comment 2 Document in Chart   Basic metabolic panel     Status: Abnormal   Collection Time: 05/02/18  9:30 AM  Result Value Ref Range   Sodium 139 135 - 145 mmol/L   Potassium 3.7 3.5 - 5.1 mmol/L   Chloride 107 98 - 111 mmol/L   CO2 25 22 - 32 mmol/L   Glucose, Bld 122 (H) 70 - 99 mg/dL   BUN 6 6 - 20 mg/dL   Creatinine, Ser 0.97 0.44 - 1.00 mg/dL   Calcium 8.1 (L) 8.9 - 10.3 mg/dL   GFR calc non Af Amer >60 >60 mL/min   GFR calc Af Amer >60 >60 mL/min   Anion gap 7 5 - 15    Comment: Performed at [facility], [place] [address] [place], [place] [address]  Glucose,  capillary     Status: None   Collection Time: 05/02/18 11:47 AM  Result Value Ref Range   Glucose-Capillary 93 70 - 99 mg/dL   Comment 1 Notify RN     Current Facility-Administered Medications  Medication Dose Route Frequency Provider Last Rate Last Dose  . 0.9 %  sodium chloride infusion   Intravenous Continuous Radene Gunning, NP 125 mL/hr at  05/02/18 0930    . amLODipine (NORVASC) tablet 5 mg  5 mg Oral Daily Radene Gunning, NP   5 mg at 05/02/18 1154  . antiseptic oral rinse (BIOTENE) solution 15 mL  15 mL Mouth Rinse PRN Eliseo Squires, Jessica U, DO      . enoxaparin (LOVENOX) injection 40 mg  40 mg Subcutaneous Q24H Shela Leff, MD   40 mg at 05/01/18 2102  . LORazepam (ATIVAN) injection 1 mg  1 mg Intravenous Q10 min PRN Shela Leff, MD        Musculoskeletal: Strength & Muscle Tone: within normal limits Gait & Station: UTA since patient is lying in bed. Patient leans: N/A  Psychiatric Specialty Exam: Physical Exam  Nursing note and vitals reviewed. Constitutional: She is oriented to person, place, and time. She appears well-developed and well-nourished.  HENT:  Head: Normocephalic and atraumatic.  Neck: Normal range of motion.  Respiratory: Effort normal.  Musculoskeletal: Normal range of motion.  Neurological: She is alert and oriented to person, place, and time.  Psychiatric: Her speech is normal and behavior is normal. Judgment and thought content normal. Cognition and memory are normal. She exhibits a depressed mood.    Review of Systems  Cardiovascular: Negative for chest pain.  Gastrointestinal: Negative for abdominal pain, constipation, diarrhea, nausea and vomiting.  Psychiatric/Behavioral: Positive for depression and suicidal ideas. Negative for hallucinations and substance abuse.  All other systems reviewed and are negative.   Blood pressure (!) 155/59, pulse 88, temperature 99 F (37.2 C), temperature source Oral, resp. rate 18, height 5' (1.524 m), weight 108.9 kg, SpO2 99 %.Body mass index is 46.87 kg/m.  General Appearance: Fairly Groomed, morbidly obese, middle aged, African American female, wearing a hospital gown with unbrushed hair and corrective lenses who is lying in bed. NAD.   Eye Contact:  Good  Speech:  Clear and Coherent and Normal Rate  Volume:  Normal  Mood:  Depressed  Affect:   Constricted  Thought Process:  Goal Directed, Linear and Descriptions of Associations: Intact  Orientation:  Full (Time, Place, and Person)  Thought Content:  Logical  Suicidal Thoughts:  Yes.  with intent/plan  Homicidal Thoughts:  No  Memory:  Immediate;   Good Recent;   Good Remote;   Good  Judgement:  Fair  Insight:  Fair  Psychomotor Activity:  Normal  Concentration:  Concentration: Good and Attention Span: Good  Recall:  Good  Fund of Knowledge:  Good  Language:  Good  Akathisia:  No  Handed:  Right  AIMS (if indicated):   N/A  Assets:  Communication Skills Desire for Improvement Financial Resources/Insurance Housing Resilience Social Support  ADL's:  Intact  Cognition:  WNL  Sleep:   Poor. Improved since hospitalization.    Assessment:  Patricia Rodriguez is a 53 y.o. female who was admitted with Benadryl overdose. She reports multiple psychosocial stressors which she attributes to worsening depressive symptoms and suicide attempt. She endorses current SI with intention to harm self again and wishing her recent attempt was successful. She denies HI or AVH. She warrants  inpatient psychiatric hospitalization for stabilization and treatment.   Treatment Plan Summary: -Patient warrants inpatient psychiatric hospitalization given high risk of harm to self. -Continue bedside sitter.  -Continue to hold home psychotropic medications since patient has not taken them for more than 1.5 years. Will defer management to inpatient psychiatric team given recent overdose.  -EKG reviewed and QTc 432 on 1/14. Please closely monitor when starting or increasing QTc prolonging agents.   -Please pursue involuntary commitment if patient refuses voluntary psychiatric hospitalization or attempts to leave the hospital.  -Will sign off on patient at this time. Please consult psychiatry again as needed.     Disposition: Recommend psychiatric Inpatient admission when medically  cleared.  Faythe Dingwall, DO 05/02/2018 4:00 PM

## 2018-05-02 NOTE — Plan of Care (Signed)
  Problem: Activity: Goal: Risk for activity intolerance will decrease Outcome: Progressing   

## 2018-05-03 ENCOUNTER — Encounter (HOSPITAL_COMMUNITY): Payer: Self-pay

## 2018-05-03 ENCOUNTER — Inpatient Hospital Stay (HOSPITAL_COMMUNITY)
Admission: AD | Admit: 2018-05-03 | Discharge: 2018-05-11 | DRG: 885 | Disposition: A | Discharge status: Home / Self Care | Payer: Federal, State, Local not specified - Other | Source: Intra-hospital | Attending: Psychiatry | Admitting: Psychiatry

## 2018-05-03 ENCOUNTER — Other Ambulatory Visit: Payer: Self-pay | Admitting: Registered Nurse

## 2018-05-03 ENCOUNTER — Other Ambulatory Visit: Payer: Self-pay

## 2018-05-03 DIAGNOSIS — F411 Generalized anxiety disorder: Secondary | ICD-10-CM | POA: Diagnosis present

## 2018-05-03 DIAGNOSIS — Z818 Family history of other mental and behavioral disorders: Secondary | ICD-10-CM | POA: Diagnosis not present

## 2018-05-03 DIAGNOSIS — F339 Major depressive disorder, recurrent, unspecified: Secondary | ICD-10-CM | POA: Diagnosis present

## 2018-05-03 DIAGNOSIS — I10 Essential (primary) hypertension: Secondary | ICD-10-CM | POA: Diagnosis present

## 2018-05-03 DIAGNOSIS — F319 Bipolar disorder, unspecified: Secondary | ICD-10-CM | POA: Diagnosis present

## 2018-05-03 DIAGNOSIS — E119 Type 2 diabetes mellitus without complications: Secondary | ICD-10-CM | POA: Diagnosis present

## 2018-05-03 DIAGNOSIS — R45851 Suicidal ideations: Secondary | ICD-10-CM | POA: Diagnosis present

## 2018-05-03 DIAGNOSIS — G4700 Insomnia, unspecified: Secondary | ICD-10-CM | POA: Diagnosis present

## 2018-05-03 DIAGNOSIS — F314 Bipolar disorder, current episode depressed, severe, without psychotic features: Secondary | ICD-10-CM | POA: Diagnosis not present

## 2018-05-03 DIAGNOSIS — T443X2D Poisoning by other parasympatholytics [anticholinergics and antimuscarinics] and spasmolytics, intentional self-harm, subsequent encounter: Secondary | ICD-10-CM

## 2018-05-03 DIAGNOSIS — Z915 Personal history of self-harm: Secondary | ICD-10-CM

## 2018-05-03 DIAGNOSIS — F3130 Bipolar disorder, current episode depressed, mild or moderate severity, unspecified: Secondary | ICD-10-CM | POA: Diagnosis not present

## 2018-05-03 DIAGNOSIS — Z813 Family history of other psychoactive substance abuse and dependence: Secondary | ICD-10-CM | POA: Diagnosis not present

## 2018-05-03 DIAGNOSIS — F419 Anxiety disorder, unspecified: Secondary | ICD-10-CM | POA: Diagnosis not present

## 2018-05-03 LAB — GLUCOSE, CAPILLARY
Glucose-Capillary: 129 mg/dL — ABNORMAL HIGH (ref 70–99)
Glucose-Capillary: 164 mg/dL — ABNORMAL HIGH (ref 70–99)
Glucose-Capillary: 135 mg/dL — ABNORMAL HIGH (ref 70–99)

## 2018-05-03 MED ORDER — AMLODIPINE BESYLATE 10 MG PO TABS
10.0000 mg | ORAL_TABLET | Freq: Every day | ORAL | Status: DC
  Administered 2018-05-03: 10 mg via ORAL

## 2018-05-03 MED ORDER — METFORMIN HCL 500 MG PO TABS
500.0000 mg | ORAL_TABLET | Freq: Every day | ORAL | Status: DC
  Administered 2018-05-03 – 2018-05-11 (×9): 500 mg via ORAL
  Filled 2018-05-03 (×2): qty 1
  Filled 2018-05-03: qty 7
  Filled 2018-05-03 (×11): qty 1

## 2018-05-03 MED ORDER — HYDROXYZINE HCL 25 MG PO TABS
25.0000 mg | ORAL_TABLET | Freq: Three times a day (TID) | ORAL | Status: DC | PRN
  Administered 2018-05-04 – 2018-05-06 (×3): 25 mg via ORAL
  Filled 2018-05-03 (×3): qty 1
  Filled 2018-05-03: qty 10

## 2018-05-03 MED ORDER — CLONIDINE HCL 0.1 MG PO TABS
0.1000 mg | ORAL_TABLET | Freq: Three times a day (TID) | ORAL | Status: DC | PRN
  Administered 2018-05-09 – 2018-05-10 (×4): 0.1 mg via ORAL
  Filled 2018-05-03 (×3): qty 1

## 2018-05-03 MED ORDER — ACETAMINOPHEN 325 MG PO TABS
650.0000 mg | ORAL_TABLET | Freq: Four times a day (QID) | ORAL | Status: DC | PRN
  Administered 2018-05-03: 650 mg via ORAL
  Filled 2018-05-03: qty 2

## 2018-05-03 MED ORDER — TRAZODONE HCL 50 MG PO TABS
50.0000 mg | ORAL_TABLET | Freq: Every evening | ORAL | Status: DC | PRN
  Filled 2018-05-03: qty 7
  Filled 2018-05-03: qty 1

## 2018-05-03 MED ORDER — OXCARBAZEPINE 150 MG PO TABS
75.0000 mg | ORAL_TABLET | Freq: Every day | ORAL | Status: DC
  Administered 2018-05-03 – 2018-05-04 (×2): 75 mg via ORAL
  Filled 2018-05-03 (×4): qty 0.5
  Filled 2018-05-03: qty 1

## 2018-05-03 MED ORDER — AMLODIPINE BESYLATE 10 MG PO TABS: 10.0000 mg | ORAL_TABLET | Freq: Every day | ORAL | Status: DC

## 2018-05-03 MED ORDER — OXCARBAZEPINE 150 MG PO TABS
150.0000 mg | ORAL_TABLET | Freq: Every day | ORAL | Status: DC
  Administered 2018-05-03: 150 mg via ORAL
  Filled 2018-05-03 (×4): qty 1

## 2018-05-03 MED ORDER — ACETAMINOPHEN 325 MG PO TABS: 650.0000 mg | ORAL_TABLET | Freq: Four times a day (QID) | ORAL | Status: DC | PRN

## 2018-05-03 MED ORDER — IBUPROFEN 400 MG PO TABS
400.0000 mg | ORAL_TABLET | Freq: Four times a day (QID) | ORAL | Status: DC | PRN
  Administered 2018-05-03 – 2018-05-11 (×4): 400 mg via ORAL
  Filled 2018-05-03 (×4): qty 1

## 2018-05-03 MED ORDER — HYDROCHLOROTHIAZIDE 25 MG PO TABS
25.0000 mg | ORAL_TABLET | Freq: Once | ORAL | Status: AC
  Administered 2018-05-03: 25 mg via ORAL
  Filled 2018-05-03 (×2): qty 1

## 2018-05-03 MED ORDER — MAGNESIUM HYDROXIDE 400 MG/5ML PO SUSP: 30.0000 mL | Freq: Every day | ORAL | Status: DC | PRN

## 2018-05-03 MED ORDER — BUPROPION HCL ER (XL) 150 MG PO TB24
150.0000 mg | ORAL_TABLET | Freq: Every day | ORAL | Status: DC
  Administered 2018-05-03 – 2018-05-09 (×7): 150 mg via ORAL
  Filled 2018-05-03 (×10): qty 1

## 2018-05-03 MED ORDER — ALUM & MAG HYDROXIDE-SIMETH 200-200-20 MG/5ML PO SUSP
30.0000 mL | ORAL | Status: DC | PRN
  Administered 2018-05-06: 30 mL via ORAL
  Filled 2018-05-03: qty 30

## 2018-05-03 MED ORDER — BIOTENE DRY MOUTH MT LIQD
15.0000 mL | OROMUCOSAL | Status: DC | PRN
  Administered 2018-05-05 – 2018-05-10 (×5): 15 mL via OROMUCOSAL
  Filled 2018-05-03 (×2): qty 15

## 2018-05-03 MED ORDER — KETOTIFEN FUMARATE 0.025 % OP SOLN
1.0000 [drp] | Freq: Two times a day (BID) | OPHTHALMIC | Status: DC
  Administered 2018-05-04 – 2018-05-11 (×15): 1 [drp] via OPHTHALMIC
  Filled 2018-05-03: qty 5

## 2018-05-03 NOTE — Discharge Instructions (Signed)
Follow with Primary MD Scifres, Earlie Server, PA-C after discharge from [facility], CMP,  checked  by Primary MD next visit.    Activity: As tolerated with Full fall precautions use walker/cane & assistance as needed   Disposition [facility]   Diet: Heart Healthy   On your next visit with your primary care physician please Get Medicines reviewed and adjusted.   Please request your Prim.MD to go over all Hospital Tests and Procedure/Radiological results at the follow up, please get all Hospital records sent to your Prim MD by signing hospital release before you go home.   If you experience worsening of your admission symptoms, develop shortness of breath, life threatening emergency, suicidal or homicidal thoughts you must seek medical attention immediately by calling 911 or calling your MD immediately  if symptoms less severe.  You Must read complete instructions/literature along with all the possible adverse reactions/side effects for all the Medicines you take and that have been prescribed to you. Take any new Medicines after you have completely understood and accpet all the possible adverse reactions/side effects.   Do not drive, operating heavy machinery, perform activities at heights, swimming or participation in water activities or provide baby sitting services if your were admitted for syncope or siezures until you have seen by Primary MD or a Neurologist and advised to do so again.  Do not drive when taking Pain medications.    Do not take more than prescribed Pain, Sleep and Anxiety Medications  Special Instructions: If you have smoked or chewed Tobacco  in the last 2 yrs please stop smoking, stop any regular Alcohol  and or any Recreational drug use.  Wear Seat belts while driving.   Please note  You were cared for by a hospitalist during your hospital stay. If you have any questions about your discharge medications or the care you received while you were in the hospital after  you are discharged, you can call the unit and asked to speak with the hospitalist on call if the hospitalist that took care of you is not available. Once you are discharged, your primary care physician will handle any further medical issues. Please note that NO REFILLS for any discharge medications will be authorized once you are discharged, as it is imperative that you return to your primary care physician (or establish a relationship with a primary care physician if you do not have one) for your aftercare needs so that they can reassess your need for medications and monitor your lab values.

## 2018-05-03 NOTE — Progress Notes (Signed)
D: Pt was in bed in her room upon initial approach.  She smiles with interaction.  Pt describes her day as "fair."  She reports her goal is "just settling in for tonight."  Pt denies SI/HI, denies hallucinations, reports pain from headache of 6/10.  She has been visible in milieu interacting with peers and staff cautiously.  Pt attended evening group.  A: Introduced self to pt.  Actively listened to pt and offered support and encouragement.  Medication administered per order.  PRN medication for pain was administered.  Q15 minute safety checks maintained.  R: Pt is compliant with medications.  She verbally contracts for safety and reports she will inform staff of needs and concerns.  Will continue to monitor and assess.

## 2018-05-03 NOTE — [facility] Group Notes (Signed)
Adult Psychoeducational Group Note  Date:  05/03/2018 Time:  9:30 PM  Group Topic/Focus:  Wrap-Up Group:   The focus of this group is to help patients review their daily goal of treatment and discuss progress on daily workbooks.  Participation Level:  Active  Participation Quality:  Appropriate and Attentive  Affect:  Appropriate  Cognitive:  Alert and Appropriate  Insight: Appropriate and Good  Engagement in Group:  Engaged  Modes of Intervention:  Discussion and Education  Additional Comments:  Pt attended and participated in wrap up group this evening. Pt rated their day a 5/10, due to them settling into [facility]. Pt goal while they are here is to create a wellness plan to follow.   Cristi Loron 05/03/2018, 9:30 PM
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
## 2018-05-03 NOTE — H&P (Signed)
Psychiatric Admission Assessment Adult  Patient Identification: Patricia Rodriguez MRN:  [id number] Date of Evaluation:  05/03/2018 Chief Complaint:  Bipolar Severe Depression Principal Diagnosis: <principal problem not specified> Diagnosis:  Active Problems:   Bipolar disorder ([place])   Major depression, recurrent ([place])  History of Present Illness: Patient is seen and examined.  Patient is a 53 year old female with a reported past psychiatric history significant for depression, bipolar disorder, [place] phobia, generalized anxiety who presented to the [facility] emergency department on 1/13 after an intentional overdose of 24 -25 mg Benadryl tablets.  It was estimated that this was approximately 600 mg.  The patient stated that she has been attempting to get disability because of her psychiatric illness.  She stated the instigating factor of this overdose was because she was turned down for disability.  She is unable to financially support her self, and because of her psychiatric illness she is unable to continue to work.  She stated that this had pushed her over the edge.  The patient admitted that she had attempted suicide on 5 or 6 previous occasions.  Her last serious overdose was approximately 4 years ago.  The patient overdosed on Xanax at that time.  Most recently she had been treated with Latuda, Wellbutrin XL and Xanax.  Her last Xanax prescription was in February 2018.  She lost her insurance at that time, and has been unable to afford treatment since then.  She stated in the past that she had periods of time where she would be awake for 2 3 days at a time without getting tired, but never left her apartment.  She stated she had periods of time where her mood was elevated, but was unable to quantitate whether or not it was euphoria.  She has a past medical history significant for diabetes mellitus type 2, hypertension, migraines, dry eyes.  She was transferred to our facility for evaluation and  stabilization.  Associated Signs/Symptoms: Depression Symptoms:  depressed mood, anhedonia, insomnia, psychomotor agitation, fatigue, feelings of worthlessness/guilt, difficulty concentrating, hopelessness, suicidal thoughts with specific plan, suicidal attempt, anxiety, loss of energy/fatigue, disturbed sleep, weight gain, (Hypo) Manic Symptoms:  Distractibility, Elevated Mood, Impulsivity, Irritable Mood, Anxiety Symptoms:  Excessive Worry, Psychotic Symptoms:  Denied PTSD Symptoms: Negative Total Time spent with patient: 45 minutes  Past Psychiatric History: Patient has been admitted to the psychiatric hospital on several occasions after intentional overdoses.  Her last psychiatric hospitalization was at [facility] in 2016.  She has been previously treated with Wellbutrin XL, Latuda, Xanax and several other medications which have not been very effective.  She has gone without medication for over a year and a half currently because the loss of her insurance.  Is the patient at risk to self? Yes.    Has the patient been a risk to self in the past 6 months? Yes.    Has the patient been a risk to self within the distant past? Yes.    Is the patient a risk to others? No.  Has the patient been a risk to others in the past 6 months? No.  Has the patient been a risk to others within the distant past? No.   Prior Inpatient Therapy:   Prior Outpatient Therapy:    Alcohol Screening: Patient refused Alcohol Screening Tool: Yes 1. How often do you have a drink containing alcohol?: Never 2. How many drinks containing alcohol do you have on a typical day when you are drinking?: 1 or 2 3. How  often do you have six or more drinks on one occasion?: Never AUDIT-C Score: 0 Intervention/Follow-up: Patient Refused Substance Abuse History in the last 12 months:  No. Consequences of Substance Abuse: Negative Previous Psychotropic Medications: Yes  Psychological Evaluations: Yes  Past  Medical History:  Past Medical History:  Diagnosis Date  . Anxiety   . Depression   . Diabetes mellitus without complication ([place])   . Headache(784.0)   . Hypertension   . IBS (irritable bowel syndrome)   . Mental disorder     Past Surgical History:  Procedure Laterality Date  . NO PAST SURGERIES     Family History:  Family History  Problem Relation Age of Onset  . Depression Maternal Aunt   . Alcohol abuse Maternal Aunt   . Drug abuse Maternal Aunt   . Drug abuse Father   . Alcohol abuse Paternal Aunt   . Drug abuse Paternal Aunt   . Alcohol abuse Maternal Uncle   . Drug abuse Maternal Uncle   . Alcohol abuse Paternal Uncle   . Drug abuse Paternal Uncle    Family Psychiatric  History: She had some distant relatives who had attempted suicide in the past Tobacco Screening: Have you used any form of tobacco in the last 30 days? (Cigarettes, Smokeless Tobacco, Cigars, and/or Pipes): No Social History:  Social History   Substance and Sexual Activity  Alcohol Use Not Currently  . Alcohol/week: 0.0 standard drinks   Comment: socially     Social History   Substance and Sexual Activity  Drug Use No    Additional Social History:      History of alcohol / drug use?: No history of alcohol / drug abuse                    Allergies:   Allergies  Allergen Reactions  . Oxycodone Other (See Comments)    unknown  . Penicillins Hives    DID THE REACTION INVOLVE: Swelling of the face/tongue/throat, SOB, or low BP? Unknown Sudden or severe rash/hives, skin peeling, or the inside of the mouth or nose? Unknown Did it require medical treatment? Unknown When did it last happen?unk If all above answers are "NO", may proceed with cephalosporin use.    Lab Results:  Results for orders placed or performed during the hospital encounter of 05/01/18 (from the past 48 hour(s))  HIV antibody (Routine Testing)     Status: None   Collection Time: 05/02/18  4:35 AM   Result Value Ref Range   HIV Screen 4th Generation wRfx Non Reactive Non Reactive    Comment: (NOTE) Performed At: [facility] [place], [place] [id number] Rush Farmer MD KC:[id number]   CBC     Status: Abnormal   Collection Time: 05/02/18  4:35 AM  Result Value Ref Range   WBC 11.0 (H) 4.0 - 10.5 K/uL   RBC 4.61 3.87 - 5.11 MIL/uL   Hemoglobin 11.9 (L) 12.0 - 15.0 g/dL   HCT 39.8 36.0 - 46.0 %   MCV 86.3 80.0 - 100.0 fL   MCH 25.8 (L) 26.0 - 34.0 pg   MCHC 29.9 (L) 30.0 - 36.0 g/dL   RDW 14.7 11.5 - 15.5 %   Platelets 422 (H) 150 - 400 K/uL   nRBC 0.0 0.0 - 0.2 %    Comment: Performed at [facility], [place] [address] [place], [place] [address]  Hemoglobin A1c     Status: Abnormal   Collection  Time: 05/02/18  4:35 AM  Result Value Ref Range   Hgb A1c MFr Bld 7.6 (H) 4.8 - 5.6 %    Comment: (NOTE) Pre diabetes:          5.7%-6.4% Diabetes:              >6.4% Glycemic control for   <7.0% adults with diabetes    Mean Plasma Glucose 171.42 mg/dL    Comment: Performed at [facility] [address] [place], [place] [address]  Glucose, capillary     Status: Abnormal   Collection Time: 05/02/18  7:44 AM  Result Value Ref Range   Glucose-Capillary 128 (H) 70 - 99 mg/dL   Comment 1 Notify RN    Comment 2 Document in Chart   Basic metabolic panel     Status: Abnormal   Collection Time: 05/02/18  9:30 AM  Result Value Ref Range   Sodium 139 135 - 145 mmol/L   Potassium 3.7 3.5 - 5.1 mmol/L   Chloride 107 98 - 111 mmol/L   CO2 25 22 - 32 mmol/L   Glucose, Bld 122 (H) 70 - 99 mg/dL   BUN 6 6 - 20 mg/dL   Creatinine, Ser 0.97 0.44 - 1.00 mg/dL   Calcium 8.1 (L) 8.9 - 10.3 mg/dL   GFR calc non Af Amer >60 >60 mL/min   GFR calc Af Amer >60 >60 mL/min   Anion gap 7 5 - 15    Comment: Performed at [facility], [place] [address], [place], [place] [address]  Glucose, capillary     Status: None   Collection Time: 05/02/18 11:47 AM   Result Value Ref Range   Glucose-Capillary 93 70 - 99 mg/dL   Comment 1 Notify RN   Glucose, capillary     Status: Abnormal   Collection Time: 05/02/18  4:40 PM  Result Value Ref Range   Glucose-Capillary 140 (H) 70 - 99 mg/dL  Glucose, capillary     Status: Abnormal   Collection Time: 05/02/18 11:35 PM  Result Value Ref Range   Glucose-Capillary 128 (H) 70 - 99 mg/dL  Glucose, capillary     Status: Abnormal   Collection Time: 05/03/18  7:47 AM  Result Value Ref Range   Glucose-Capillary 129 (H) 70 - 99 mg/dL   Comment 1 Notify RN   Glucose, capillary     Status: Abnormal   Collection Time: 05/03/18 11:18 AM  Result Value Ref Range   Glucose-Capillary 164 (H) 70 - 99 mg/dL   Comment 1 Notify RN     Blood Alcohol level:  Lab Results  Component Value Date   ETH <10 05/01/2018   ETH <11 37/62/8315    Metabolic Disorder Labs:  Lab Results  Component Value Date   HGBA1C 7.6 (H) 05/02/2018   MPG 171.42 05/02/2018   No results found for: PROLACTIN No results found for: CHOL, TRIG, HDL, CHOLHDL, VLDL, LDLCALC  Current Medications: Current Facility-Administered Medications  Medication Dose Route Frequency Provider Last Rate Last Dose  . alum & mag hydroxide-simeth (MAALOX/MYLANTA) 200-200-20 MG/5ML suspension 30 mL  30 mL Oral Q4H PRN Sharma Covert, MD      . antiseptic oral rinse (BIOTENE) solution 15 mL  15 mL Mouth Rinse PRN Sharma Covert, MD      . buPROPion (WELLBUTRIN XL) 24 hr tablet 150 mg  150 mg Oral Daily Sharma Covert, MD      . cloNIDine (CATAPRES) tablet 0.1  mg  0.1 mg Oral TID PRN Sharma Covert, MD      . hydrochlorothiazide (HYDRODIURIL) tablet 25 mg  25 mg Oral Once Sharma Covert, MD      . hydrOXYzine (ATARAX/VISTARIL) tablet 25 mg  25 mg Oral TID PRN Sharma Covert, MD      . ibuprofen (ADVIL,MOTRIN) tablet 400 mg  400 mg Oral Q6H PRN Sharma Covert, MD      . ketotifen (ZADITOR) 0.025 % ophthalmic solution 1 drop  1 drop  Both Eyes BID Sharma Covert, MD      . magnesium hydroxide (MILK OF MAGNESIA) suspension 30 mL  30 mL Oral Daily PRN Sharma Covert, MD      . metFORMIN (GLUCOPHAGE) tablet 500 mg  500 mg Oral Q breakfast Sharma Covert, MD      . OXcarbazepine (TRILEPTAL) tablet 150 mg  150 mg Oral QHS Sharma Covert, MD      . OXcarbazepine (TRILEPTAL) tablet 75 mg  75 mg Oral Daily Sharma Covert, MD      . traZODone (DESYREL) tablet 50 mg  50 mg Oral QHS PRN Sharma Covert, MD       PTA Medications: Medications Prior to Admission  Medication Sig Dispense Refill Last Dose  . acetaminophen (TYLENOL) 325 MG tablet Take 2 tablets (650 mg total) by mouth every 6 (six) hours as needed for moderate pain, fever or headache.     Derrill Memo ON 05/04/2018] amLODipine (NORVASC) 10 MG tablet Take 1 tablet (10 mg total) by mouth daily.       Musculoskeletal: Strength & Muscle Tone: within normal limits Gait & Station: normal Patient leans: N/A  Psychiatric Specialty Exam: Physical Exam  Nursing note and vitals reviewed. Constitutional: She is oriented to person, place, and time. She appears well-developed and well-nourished.  HENT:  Head: Normocephalic and atraumatic.  Respiratory: Effort normal.  Neurological: She is alert and oriented to person, place, and time.    ROS  Blood pressure (!) 198/102, pulse (!) 103, temperature 99.5 F (37.5 C), temperature source Oral, resp. rate 18, height 5' (1.524 m), weight 108.4 kg, SpO2 100 %.Body mass index is 46.68 kg/m.  General Appearance: Casual  Eye Contact:  Fair  Speech:  Normal Rate  Volume:  Normal  Mood:  Anxious and Depressed  Affect:  Congruent  Thought Process:  Coherent and Descriptions of Associations: Intact  Orientation:  Full (Time, Place, and Person)  Thought Content:  Logical  Suicidal Thoughts:  Yes.  with intent/plan  Homicidal Thoughts:  No  Memory:  Immediate;   Fair Recent;   Fair Remote;   Fair  Judgement:   Intact  Insight:  Fair  Psychomotor Activity:  Normal  Concentration:  Concentration: Good and Attention Span: Good  Recall:  Good  Fund of Knowledge:  Good  Language:  Good  Akathisia:  Negative  Handed:  Right  AIMS (if indicated):     Assets:  Communication Skills Desire for Improvement Housing Resilience Social Support  ADL's:  Intact  Cognition:  WNL  Sleep:       Treatment Plan Summary: Daily contact with patient to assess and evaluate symptoms and progress in treatment, Medication management and Plan : Patient is seen and examined.  Patient is a 53 year old female with the above-stated past psychiatric history was transferred from [facility] to our facility for treatment of depression/bipolar disorder/anxiety.  She will be admitted to the psychiatric  hospital.  She will be integrated into the milieu.  She will be encouraged to attend groups.  She had previously been on Latuda, and there was some suspicion of bipolar disorder.  She does not fully fulfill criteria, but I am concerned.  We will restart her Wellbutrin XL and start that at 150 mg p.o. daily.  For mood stability given her anxiety and insomnia we will start Trileptal 75 mg p.o. daily as well as 150 mg p.o. nightly and titrate this.  She will also have hydroxyzine for breakthrough anxiety, and trazodone for as needed insomnia.  Her blood pressure is significantly elevated on transfer, and I am going to give her some hydrochlorothiazide 25 mg x 1 to see if that brings her pressure down.  She will also have clonidine 0.1 mg p.o. every 8 hours as needed systolic blood pressure greater than 160.  The elevated blood pressure may be because of underlying blood pressure problems as well as her Benadryl overdose.  She also has a history of diabetes mellitus type 2, and her hemoglobin A1c is 7.6.  She is currently untreated for this.  I am going to start metformin 500 mg p.o. daily, and will titrate this through the  course the hospitalization.  Her renal function is normal, but her MCH and MCHC are both decreased.  We will check her folic acid levels as well as her B12 levels.  We will also check her methylmalonic acid levels.  She does have a history of dry eyes and has a dry mouth, but does not endorse any other symptoms consistent with disorder similar to Sjogren's   Observation Level/Precautions:  15 minute checks  Laboratory:  Chemistry Profile  Psychotherapy:    Medications:    Consultations:    Discharge Concerns:    Estimated LOS:  Other:     Physician Treatment Plan for Primary Diagnosis: <principal problem not specified> Long Term Goal(s): Improvement in symptoms so as ready for discharge  Short Term Goals: Ability to identify changes in lifestyle to reduce recurrence of condition will improve, Ability to verbalize feelings will improve, Ability to disclose and discuss suicidal ideas, Ability to demonstrate self-control will improve, Ability to identify and develop effective coping behaviors will improve, Ability to maintain clinical measurements within normal limits will improve and Compliance with prescribed medications will improve  Physician Treatment Plan for Secondary Diagnosis: Active Problems:   Bipolar disorder ([place])   Major depression, recurrent ([place])  Long Term Goal(s): Improvement in symptoms so as ready for discharge  Short Term Goals: Ability to identify changes in lifestyle to reduce recurrence of condition will improve, Ability to verbalize feelings will improve, Ability to disclose and discuss suicidal ideas, Ability to demonstrate self-control will improve, Ability to identify and develop effective coping behaviors will improve, Ability to maintain clinical measurements within normal limits will improve and Compliance with prescribed medications will improve  I certify that inpatient services furnished can reasonably be expected to improve the patient's condition.    Sharma Covert, MD 1/15/20203:49 PM

## 2018-05-03 NOTE — Progress Notes (Signed)
Pt accepted to [facility], Bed 401-2  Leilani Merl, MD is the accepting provider.  Myles Lipps, MD is the attending provider.  Call report to [phone]  [place] notified.   Pt is Voluntary.  Pt may be transported by [facility]  Pt scheduled  to arrive at 12:00 Noon.  Areatha Keas. Judi Cong, MSW, [facility] Clinical Social Work [phone] (cell) [phone] (office)

## 2018-05-03 NOTE — Progress Notes (Signed)
Patient request something for a headache.  Paged MD to get order

## 2018-05-03 NOTE — Plan of Care (Signed)
  Problem: Safety: Goal: Periods of time without injury will increase Outcome: Progressing Note:  Pt has not harmed self or others tonight.  She denies SI/HI and verbally contracts for safety.
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
## 2018-05-03 NOTE — Discharge Summary (Signed)
Patricia Rodriguez, is a 53 y.o. female  DOB 10-16-1965  MRN [id number].  Admission date:  05/01/2018  Admitting Physician  Shela Leff, MD  Discharge Date:  05/03/2018   Primary MD  Scifres, Earlie Server, PA-C  Recommendations for primary care physician for things to follow:  -Check CBC, BMP in 3 days   Admission Diagnosis  Anticholinergic drug overdose [T44.3X1A]   Discharge Diagnosis  Anticholinergic drug overdose [T44.3X1A]    Principal Problem:   Suicide attempt [facility]) Active Problems:   Anticholinergic drug overdose   Thrombocytosis ([place])   HTN (hypertension)   Type 2 diabetes mellitus ([place])      Past Medical History:  Diagnosis Date  . Anxiety   . Depression   . Diabetes mellitus without complication ([place])   . Headache(784.0)   . Hypertension   . IBS (irritable bowel syndrome)   . Mental disorder     Past Surgical History:  Procedure Laterality Date  . NO PAST SURGERIES         History of present illness and  Hospital Course:     Kindly see H&P for history of present illness and admission details, please review complete Labs, Consult reports and Test reports for all details in brief  HPI  from the history and physical done on the day of admission 05/01/2017  HPI: Patricia Rodriguez is a 53 y.o. female with medical history significant of anxiety, depression, type 2 diabetes, hypertension presenting to the hospital via EMS after she overdosed on Benadryl.  Patient states that around 12 PM this afternoon she took 22 tablets of Benadryl with intention to kill herself.  States she does not have a psychiatrist and has not taken her depression medication since she lost her job in 2018.  Reports previous history of suicide attempt in 2015 by overdosing on Tylenol PM.  At present, reports feeling sluggish but has no other complaints.  Denies any ethanol, tobacco, or illicit drug use.   States she was previously drinking only socially but has not done so in several years.  ED Course: Tachycardic and hypertensive.  Received IV fluid and Ativan.  Poison control contacted by ED.  Recommendation was to admit the patient if symptoms persisted over 6 hours.  Patient continued to be mildly confused and tachycardic after 6 hours.  UDS positive for benzos.  Acetaminophen level negative.  Salicylate level negative.  Blood ethanol level negative.   Hospital Course   53 yo pmh includes DM, htn, anxiety, depression, suicide attempt admitted for suicide attempt with benadryl. She has not been taking any medication for over a year due to finances according to patient -And has been seen by psychiatric, she does require inpatient psych hospitalization, she has been medically cleared to be discharged to a psych facility,.  Intentional Benadryl overdose/suicide attempt  -She has a history of depression but has not been on any medicationssince 2018. Attempted suicide 05/01/18 by taking 22 tablets of Benadryl 25 mg. \ - Poison control contacted by ED recommended fluids, monitoring  for urine retention and QT. Serial EKG's within limits on normal.  -Currently she is medically cleared to transfer to inpatient psychiatric facility, social worker consulted -Psych consult greatly appreciated, continue with bed center, continue to hold home psychogenic medications since she had not been taking them for last 1-1/2 years this can be addressed at inpatient psych team.  Chronic thrombocytosis  - Platelet count 422. Trending down this am -OP follow up  Hypertension. -Deviated, started on Norvasc, despite that remains elevated, so I will increase to 10 mg oral daily  Type 2 diabetes. A1c 7.2.  Currently not on any medications. -CBG 128 this am   Discharge Condition:  Medically stable to transfer to inpatient psych facility   Follow UP  Follow-up Information    Scifres, Earlie Server, PA-C.     Specialty:  Physician Assistant Contact information: [place] [address] [phone]             Discharge Instructions  and  Discharge Medications     Discharge Instructions    Discharge instructions   Complete by:  As directed    Follow with Primary MD Scifres, Earlie Server, PA-C after discharge from [place], CMP,  checked  by Primary MD next visit.    Activity: As tolerated with Full fall precautions use walker/cane & assistance as needed   Disposition [facility]   Diet: Heart Healthy   On your next visit with your primary care physician please Get Medicines reviewed and adjusted.   Please request your Prim.MD to go over all Hospital Tests and Procedure/Radiological results at the follow up, please get all Hospital records sent to your Prim MD by signing hospital release before you go home.   If you experience worsening of your admission symptoms, develop shortness of breath, life threatening emergency, suicidal or homicidal thoughts you must seek medical attention immediately by calling 911 or calling your MD immediately  if symptoms less severe.  You Must read complete instructions/literature along with all the possible adverse reactions/side effects for all the Medicines you take and that have been prescribed to you. Take any new Medicines after you have completely understood and accpet all the possible adverse reactions/side effects.   Do not drive, operating heavy machinery, perform activities at heights, swimming or participation in water activities or provide baby sitting services if your were admitted for syncope or siezures until you have seen by Primary MD or a Neurologist and advised to do so again.  Do not drive when taking Pain medications.    Do not take more than prescribed Pain, Sleep and Anxiety Medications  Special Instructions: If you have smoked or chewed Tobacco  in the last 2 yrs please stop smoking, stop any regular  Alcohol  and or any Recreational drug use.  Wear Seat belts while driving.   Please note  You were cared for by a hospitalist during your hospital stay. If you have any questions about your discharge medications or the care you received while you were in the hospital after you are discharged, you can call the unit and asked to speak with the hospitalist on call if the hospitalist that took care of you is not available. Once you are discharged, your primary care physician will handle any further medical issues. Please note that NO REFILLS for any discharge medications will be authorized once you are discharged, as it is imperative that you return to your primary care physician (or establish a relationship with  a primary care physician if you do not have one) for your aftercare needs so that they can reassess your need for medications and monitor your lab values.   Increase activity slowly   Complete by:  As directed      Allergies as of 05/03/2018      Reactions   Oxycodone Other (See Comments)   unknown   Penicillins Hives   DID THE REACTION INVOLVE: Swelling of the face/tongue/throat, SOB, or low BP? Unknown Sudden or severe rash/hives, skin peeling, or the inside of the mouth or nose? Unknown Did it require medical treatment? Unknown When did it last happen?unk If all above answers are "NO", may proceed with cephalosporin use.      Medication List    STOP taking these medications   aspirin-acetaminophen-caffeine 250-250-65 MG tablet Commonly known as:  EXCEDRIN MIGRAINE   BC HEADACHE POWDER PO     TAKE these medications   acetaminophen 325 MG tablet Commonly known as:  TYLENOL Take 2 tablets (650 mg total) by mouth every 6 (six) hours as needed for moderate pain, fever or headache.   amLODipine 10 MG tablet Commonly known as:  NORVASC Take 1 tablet (10 mg total) by mouth daily. Start taking on:  May 04, 2018         Diet and Activity recommendation: See  Discharge Instructions above   Consults obtained -  Psychiatry   Major procedures and Radiology Reports - PLEASE review detailed and final reports for all details, in brief -    No results found.  Micro Results     No results found for this or any previous visit (from the past 240 hour(s)).     Today   Subjective:   Patricia Rodriguez today Denies any chest pain, or shortness of breath, she does report some headache, still reports suicidal thoughts and ideations.   Objective:   Blood pressure 135/78, pulse 92, temperature 99 F (37.2 C), temperature source Oral, resp. rate 18, height 5' (1.524 m), weight 108.7 kg, SpO2 97 %.   Intake/Output Summary (Last 24 hours) at 05/03/2018 1144 Last data filed at 05/03/2018 1118 Gross per 24 hour  Intake 2318.75 ml  Output 2450 ml  Net -131.25 ml    Exam  Awake Alert, Oriented X 3, appears to be in a depressed mood, flat affect, still expressing suicidal thoughts and ideations Symmetrical Chest wall movement, Good air movement bilaterally, CTAB RRR,No Gallops,Rubs or new Murmurs, No Parasternal Heave +ve B.Sounds, Abd Soft, Non tender, No organomegaly appriciated, No rebound -guarding or rigidity. No Cyanosis, Clubbing or edema, No new Rash or bruise  Data Review   CBC w Diff:  Lab Results  Component Value Date   WBC 11.0 (H) 05/02/2018   HGB 11.9 (L) 05/02/2018   HCT 39.8 05/02/2018   PLT 422 (H) 05/02/2018   LYMPHOPCT 26 07/01/2014   MONOPCT 6 07/01/2014   EOSPCT 2 07/01/2014   BASOPCT 0 07/01/2014    CMP:  Lab Results  Component Value Date   NA 139 05/02/2018   K 3.7 05/02/2018   CL 107 05/02/2018   CO2 25 05/02/2018   BUN 6 05/02/2018   CREATININE 0.97 05/02/2018   PROT 7.2 05/01/2018   ALBUMIN 3.3 (L) 05/01/2018   BILITOT 1.2 05/01/2018   ALKPHOS 61 05/01/2018   AST 20 05/01/2018   ALT 16 05/01/2018  .   Total Time in preparing paper work, data evaluation and todays exam - 30  minutes     M.D on 05/03/2018 at 11:44 AM  [facility]   Office  [phone]

## 2018-05-03 NOTE — Tx Team (Signed)
Initial Treatment Plan 05/03/2018 6:20 PM [name] UOH:[id number]    PATIENT STRESSORS: Financial difficulties Health problems Medication change or noncompliance Occupational concerns   PATIENT STRENGTHS: Ability for insight Average or above average intelligence Capable of independent living Communication skills General fund of knowledge   PATIENT IDENTIFIED PROBLEMS: Depression Anxiety Racing thoughts "No coping skills"                     DISCHARGE CRITERIA:  Ability to meet basic life and health needs Adequate post-discharge living arrangements Improved stabilization in mood, thinking, and/or behavior  PRELIMINARY DISCHARGE PLAN: Outpatient therapy  PATIENT/FAMILY INVOLVEMENT: This treatment plan has been presented to and reviewed with the patient, Patricia Rodriguez.  The patient and family have been given the opportunity to ask questions and make suggestions.  Megan Mans, RN 05/03/2018, 6:20 PM

## 2018-05-03 NOTE — Clinical Social Work Note (Signed)
Clinical Social Work Assessment  Patient Details  Name: Patricia Rodriguez MRN: [id number] Date of Birth: 04/29/1965  Date of referral:  05/03/18               Reason for consult:  Facility Placement, Mental Health Concerns, Discharge Planning                Permission sought to share information with:  Facility Art therapist granted to share information::  Yes, Verbal Permission Granted  Name::        Agency::  Inpatient psychiatric facilities  Relationship::     Contact Information:     Housing/Transportation Living arrangements for the past 2 months:  Single Family Home Source of Information:  Patient, Medical Team Patient Interpreter Needed:  None Criminal Activity/Legal Involvement Pertinent to Current Situation/Hospitalization:  No - Comment as needed Significant Relationships:  Adult Children, Parents Lives with:  Self Do you feel safe going back to the place where you live?  Yes Need for family participation in patient care:  Yes (Comment)  Care giving concerns:  Psychiatrist recommending inpatient psych admission.   Social Worker assessment / plan:  CSW met with patient. Sitter at bedside. CSW introduced role and explained that psychiatrist recommendations would be discussed. Patient agreeable to inpatient admission. She stated her psych isses escalated when she was denied for disability. She does not have a lawyer to appeal this and wants information on how to appeal it. She has signed the Voluntary consent form. Made referrals to [facility] and [facility]. [facility] has a bed available today. Will notify MD. No further concerns. CSW encouraged patient to contact CSW as needed. CSW will continue to follow patient for support and facilitate discharge to inpatient psych facility once discharge orders are in.  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 Patient's daughter and mother supportive and involved in patient's care. Patient appreciated social work intervention.  Patient/Family's Understanding of and Emotional Response to Diagnosis, Current Treatment, and Prognosis:  Patient has a good understanding of the reason for admission and psychiatrist recommendations. Patient appears pleased with hospital care.  Emotional Assessment Appearance:  Appears stated age Attitude/Demeanor/Rapport:  Engaged Affect (typically observed):  Accepting, Calm, Flat Orientation:  Oriented to Self, Oriented to Place, Oriented to  Time, Oriented to Situation Alcohol / Substance use:  Never Used Psych involvement (Current and /or in the community):  Yes (Comment)(Recommendation for inpatient admission)  Discharge Needs  Concerns to be addressed:  Care Coordination, Mental Health Concerns Readmission within the last 30 days:  No Current discharge risk:  Psychiatric Illness, Lives alone Barriers to Discharge:  Other(Psych referral determination)   Candie Chroman, LCSW 05/03/2018, 11:25 AM

## 2018-05-03 NOTE — Clinical Social Work Note (Signed)
CSW facilitated patient discharge including contacting patient family (daughter at bedside) and facility to confirm patient discharge plans. Clinical information faxed to facility and family agreeable with plan. CSW arranged transport to [facility] with [facility] at 1:00. RN to call report prior to discharge [phone]). Bed 401-2. Leilani Merl, MD is the accepting provider. Myles Lipps, MD is the attending provider.   CSW will sign off for now as social work intervention is no longer needed. Please consult [place] again if new needs arise.  Dayton Scrape, [place]

## 2018-05-03 NOTE — Progress Notes (Addendum)
Called report to [place] to nurse

## 2018-05-03 NOTE — Plan of Care (Signed)
  Problem: Activity: Goal: Risk for activity intolerance will decrease Outcome: Progressing   Problem: Coping: Goal: Level of anxiety will decrease Outcome: Progressing   Problem: Safety: Goal: Ability to remain free from injury will improve Outcome: Progressing   

## 2018-05-03 NOTE — Progress Notes (Signed)
[facility] PROGRESS NOTE  Patricia Rodriguez YTK:[id number] DOB: 1966-02-20 DOA: 05/01/2018 PCP: Perlie Mayo Dorothy, PA-C   HPI 53 yo pmh includes DM, htn, anxiety, depression, suicide attempt admitted for suicide attempt with benadryl. She has not been taking any medication for over a year due to finances according to patient -And has been seen by psychiatric, she does require inpatient psych hospitalization, she has been medically cleared to be discharged to a psych facility,.  Subjective:  Denies any chest pain, or shortness of breath, she does report some headache, still reports suicidal thoughts and ideations.  Assessment/Plan:  Intentional Benadryl overdose/suicide attempt  -She has a history of depression but has not been on any medications since 2018.  Attempted suicide 05/01/18 by taking 22 tablets of Benadryl 25 mg. \ - Poison control contacted by ED recommended fluids, monitoring for urine retention and QT. Serial EKG's within limits on normal.  -Currently she is medically cleared to transfer to inpatient psychiatric facility, social worker consulted -Psych consult greatly appreciated, continue with bed center, continue to hold home psychogenic medications since she had not been taking them for last 1-1/2 years this can be addressed at inpatient psych team.  Chronic thrombocytosis  - Platelet count 422. Trending down this am -OP follow up  Hypertension. -Deviated, started on Norvasc, despite that remains elevated, so I will increase to 10 mg oral daily  Type 2 diabetes. A1c 7.2.  Currently not on any medications. -  CBG 128 this am   Code Status: full Family Communication: none present Disposition Plan: Need psych facility placement, she is agreeable to that   Consultants:  [place]  Procedures:    Antibiotics:     Objective: Vitals:   05/03/18 0154 05/03/18 0621  BP: (!) 143/86 (!) 196/80  Pulse: 87 (!) 103  Resp: 18 18  Temp: 99.1 F (37.3 C)  99 F (37.2 C)  SpO2: 96% 97%    Intake/Output Summary (Last 24 hours) at 05/03/2018 0957 Last data filed at 05/03/2018 0900 Gross per 24 hour  Intake 1838.75 ml  Output 2900 ml  Net -1061.25 ml   Filed Weights   05/01/18 2037 05/02/18 0510 05/03/18 0154  Weight: 107.7 kg 108.9 kg 108.7 kg    Exam:  Awake Alert, Oriented X 3, appears to be in a depressed mood, flat affect, still expressing suicidal thoughts and ideations Symmetrical Chest wall movement, Good air movement bilaterally, CTAB RRR,No Gallops,Rubs or new Murmurs, No Parasternal Heave +ve B.Sounds, Abd Soft, No tenderness, No rebound - guarding or rigidity. No Cyanosis, Clubbing or edema, No new Rash or bruise     Data Reviewed: Basic Metabolic Panel: Recent Labs  Lab 05/01/18 1533 05/02/18 0930  NA 138 139  K 4.5 3.7  CL 103 107  CO2 26 25  GLUCOSE 156* 122*  BUN 9 6  CREATININE 1.03* 0.97  CALCIUM 8.8* 8.1*   Liver Function Tests: Recent Labs  Lab 05/01/18 1533  AST 20  ALT 16  ALKPHOS 61  BILITOT 1.2  PROT 7.2  ALBUMIN 3.3*   No results for input(s): LIPASE, AMYLASE in the last 168 hours. No results for input(s): AMMONIA in the last 168 hours. CBC: Recent Labs  Lab 05/01/18 1533 05/02/18 0435  WBC 9.9 11.0*  HGB 13.4 11.9*  HCT 44.2 39.8  MCV 85.0 86.3  PLT 501* 422*   Cardiac Enzymes: No results for input(s): CKTOTAL, CKMB, CKMBINDEX, TROPONINI in the last 168 hours. BNP (last 3 results) No results for input(s):  BNP in the last 8760 hours.  ProBNP (last 3 results) No results for input(s): PROBNP in the last 8760 hours.  CBG: Recent Labs  Lab 05/02/18 0744 05/02/18 1147 05/02/18 1640 05/02/18 2335 05/03/18 0747  GLUCAP 128* 93 140* 128* 129*    No results found for this or any previous visit (from the past 240 hour(s)).   Studies: No results found.  Scheduled Meds: . amLODipine  5 mg Oral Daily  . enoxaparin (LOVENOX) injection  40 mg Subcutaneous Q24H    Continuous Infusions:   Principal Problem:   Suicide attempt ([place]) Active Problems:   Anticholinergic drug overdose   Thrombocytosis ([facility])   HTN (hypertension)   Type 2 diabetes mellitus ([facility])    Phillips Climes MD  [facility]  If 7PM-7AM, please contact night-coverage at [email or web address], password [facility] 05/03/2018, 9:57 AM  LOS: 1 day

## 2018-05-04 DIAGNOSIS — G4700 Insomnia, unspecified: Secondary | ICD-10-CM

## 2018-05-04 DIAGNOSIS — F3130 Bipolar disorder, current episode depressed, mild or moderate severity, unspecified: Secondary | ICD-10-CM

## 2018-05-04 DIAGNOSIS — F419 Anxiety disorder, unspecified: Secondary | ICD-10-CM

## 2018-05-04 LAB — COMPREHENSIVE METABOLIC PANEL
ALT: 16 U/L (ref 0–44)
AST: 15 U/L (ref 15–41)
Albumin: 3.7 g/dL (ref 3.5–5.0)
Alkaline Phosphatase: 59 U/L (ref 38–126)
Anion gap: 11 (ref 5–15)
BUN: 7 mg/dL (ref 6–20)
CO2: 29 mmol/L (ref 22–32)
Calcium: 8.7 mg/dL — ABNORMAL LOW (ref 8.9–10.3)
Chloride: 98 mmol/L (ref 98–111)
Creatinine, Ser: 0.87 mg/dL (ref 0.44–1.00)
GFR calc Af Amer: >60 mL/min (ref >60)
GFR calc non Af Amer: >60 mL/min (ref >60)
Glucose, Bld: 153 mg/dL — ABNORMAL HIGH (ref 70–99)
Potassium: 3.4 mmol/L — ABNORMAL LOW (ref 3.5–5.1)
SODIUM: 138 mmol/L (ref 135–145)
Total Bilirubin: 0.5 mg/dL (ref 0.3–1.2)
Total Protein: 7.5 g/dL (ref 6.5–8.1)

## 2018-05-04 LAB — GLUCOSE, CAPILLARY
Glucose-Capillary: 184 mg/dL — ABNORMAL HIGH (ref 70–99)
Glucose-Capillary: 217 mg/dL — ABNORMAL HIGH (ref 70–99)
Glucose-Capillary: 203 mg/dL — ABNORMAL HIGH (ref 70–99)

## 2018-05-04 LAB — TSH: TSH: 5.758 u[IU]/mL — ABNORMAL HIGH (ref 0.350–4.500)

## 2018-05-04 LAB — VITAMIN B12: Vitamin B-12: 165 pg/mL — ABNORMAL LOW (ref 180–914)

## 2018-05-04 LAB — FOLATE: Folate: 9.7 ng/mL (ref >5.9)

## 2018-05-04 MED ORDER — OXCARBAZEPINE 150 MG PO TABS
150.0000 mg | ORAL_TABLET | Freq: Every day | ORAL | Status: DC
  Administered 2018-05-05 – 2018-05-06 (×2): 150 mg via ORAL
  Filled 2018-05-04 (×5): qty 1

## 2018-05-04 MED ORDER — OXCARBAZEPINE 150 MG PO TABS
75.0000 mg | ORAL_TABLET | Freq: Once | ORAL | Status: AC
  Administered 2018-05-04: 75 mg via ORAL
  Filled 2018-05-04: qty 0.5

## 2018-05-04 MED ORDER — OXCARBAZEPINE 300 MG PO TABS
300.0000 mg | ORAL_TABLET | Freq: Every day | ORAL | Status: DC
  Administered 2018-05-04 – 2018-05-05 (×2): 300 mg via ORAL
  Filled 2018-05-04 (×5): qty 1

## 2018-05-04 NOTE — Plan of Care (Signed)
Nurse discussed anxiety, depression and coping skills with patient.  

## 2018-05-04 NOTE — Progress Notes (Signed)
[facility] MD Progress Note  05/04/2018 3:49 PM Patricia Rodriguez  MRN:  [id number] Subjective:  "I'm really anxious. I don't feel comfortable around so many people."  Ms. Liguori found resting in her room. Reports high anxiety related to being around so many people. States she typically isolates at home all day. She does report improved sleep and appetite. Still reports some thoughts of "I don't want to be here" but denies suicidal plans or intent and contracts for safety on the unit. She is worried about possible eviction after discharge since her disability was denied. Also reports guilt because "I broke my promise to my family to not attempt suicide again." States regret that her daughter and mother were both hurt by this suicide attempt. She reports passive SI for the last 2 years- "I thought when I stopped taking my meds I would have a heart attack eventually." States goal now of learning how to take better care of herself. Denies medication side effects.   From admission H&P: Patient is a 53 year old female with a reported past psychiatric history significant for depression, bipolar disorder, [place] phobia, generalized anxiety who presented to the [facility] emergency department on 1/13 after an intentional overdose of 24 -25 mg Benadryl tablets. It was estimated that this was approximately 600 mg. The patient stated that she has been attempting to get disability because of her psychiatric illness. She stated the instigating factor of this overdose was because she was turned down for disability. She is unable to financially support her self, and because of her psychiatric illness she is unable to continue to work. She stated that this had pushed her over the edge. The patient admitted that she had attempted suicide on 5 or 6 previous occasions. Her last serious overdose was approximately 4 years ago. The patient overdosed on Xanax at that time. Most recently she had been treated with Latuda,  Wellbutrin XL and Xanax. Her last Xanax prescription was in February 2018. She lost her insurance at that time, and has been unable to afford treatment since then. She stated in the past that she had periods of time where she would be awake for 2 3 days at a time without getting tired, but never left her apartment. She stated she had periods of time where her mood was elevated, but was unable to quantitate whether or not it was euphoria. She has a past medical history significant for diabetes mellitus type 2, hypertension, migraines, dry eyes. She was transferred to our facility for evaluation and stabilization.  Principal Problem: <principal problem not specified> Diagnosis: Active Problems:   Bipolar disorder (HCC)   Major depression, recurrent ([place])  Total Time spent with patient: 20 minutes  Past Psychiatric History: See admission H&P  Past Medical History:  Past Medical History:  Diagnosis Date  . Anxiety   . Depression   . Diabetes mellitus without complication ([place])   . Headache(784.0)   . Hypertension   . IBS (irritable bowel syndrome)   . Mental disorder     Past Surgical History:  Procedure Laterality Date  . NO PAST SURGERIES     Family History:  Family History  Problem Relation Age of Onset  . Depression Maternal Aunt   . Alcohol abuse Maternal Aunt   . Drug abuse Maternal Aunt   . Drug abuse Father   . Alcohol abuse Paternal Aunt   . Drug abuse Paternal Aunt   . Alcohol abuse Maternal Uncle   . Drug abuse Maternal Uncle   .  Alcohol abuse Paternal Uncle   . Drug abuse Paternal Uncle    Family Psychiatric  History: See admission H&P Social History:  Social History   Substance and Sexual Activity  Alcohol Use Not Currently  . Alcohol/week: 0.0 standard drinks   Comment: socially     Social History   Substance and Sexual Activity  Drug Use No    Social History   Socioeconomic History  . Marital status: Divorced    Spouse name: Not on file  .  Number of children: 1  . Years of education: Not on file  . Highest education level: Not on file  Occupational History  . Occupation: Unemployed  Social Needs  . Financial resource strain: Not on file  . Food insecurity:    Worry: Not on file    Inability: Not on file  . Transportation needs:    Medical: Not on file    Non-medical: Not on file  Tobacco Use  . Smoking status: Never Smoker  . Smokeless tobacco: Never Used  Substance and Sexual Activity  . Alcohol use: Not Currently    Alcohol/week: 0.0 standard drinks    Comment: socially  . Drug use: No  . Sexual activity: Not Currently  Lifestyle  . Physical activity:    Days per week: Not on file    Minutes per session: Not on file  . Stress: Not on file  Relationships  . Social connections:    Talks on phone: Not on file    Gets together: Not on file    Attends religious service: Not on file    Active member of club or organization: Not on file    Attends meetings of clubs or organizations: Not on file    Relationship status: Not on file  Other Topics Concern  . Not on file  Social History Narrative  . Not on file   Additional Social History:    History of alcohol / drug use?: No history of alcohol / drug abuse                    Sleep: Good  Appetite:  Good  Current Medications: Current Facility-Administered Medications  Medication Dose Route Frequency Provider Last Rate Last Dose  . alum & mag hydroxide-simeth (MAALOX/MYLANTA) 200-200-20 MG/5ML suspension 30 mL  30 mL Oral Q4H PRN Sharma Covert, MD      . antiseptic oral rinse (BIOTENE) solution 15 mL  15 mL Mouth Rinse PRN Sharma Covert, MD      . buPROPion (WELLBUTRIN XL) 24 hr tablet 150 mg  150 mg Oral Daily Sharma Covert, MD   150 mg at 05/04/18 0900  . cloNIDine (CATAPRES) tablet 0.1 mg  0.1 mg Oral TID PRN Sharma Covert, MD      . hydrOXYzine (ATARAX/VISTARIL) tablet 25 mg  25 mg Oral TID PRN Sharma Covert, MD   25 mg  at 05/04/18 0905  . ibuprofen (ADVIL,MOTRIN) tablet 400 mg  400 mg Oral Q6H PRN Sharma Covert, MD   400 mg at 05/03/18 2106  . ketotifen (ZADITOR) 0.025 % ophthalmic solution 1 drop  1 drop Both Eyes BID Sharma Covert, MD   1 drop at 05/04/18 0900  . magnesium hydroxide (MILK OF MAGNESIA) suspension 30 mL  30 mL Oral Daily PRN Sharma Covert, MD      . metFORMIN (GLUCOPHAGE) tablet 500 mg  500 mg Oral Q breakfast Mallie Darting, Cordie Grice, MD  500 mg at 05/04/18 0900  . [START ON 05/05/2018] OXcarbazepine (TRILEPTAL) tablet 150 mg  150 mg Oral Daily Sharma Covert, MD      . Oxcarbazepine (TRILEPTAL) tablet 300 mg  300 mg Oral QHS Sharma Covert, MD      . traZODone (DESYREL) tablet 50 mg  50 mg Oral QHS PRN Sharma Covert, MD        Lab Results:  Results for orders placed or performed during the hospital encounter of 05/03/18 (from the past 48 hour(s))  Glucose, capillary     Status: Abnormal   Collection Time: 05/03/18  5:23 PM  Result Value Ref Range   Glucose-Capillary 135 (H) 70 - 99 mg/dL   Comment 1 Notify RN    Comment 2 Document in Chart   Comprehensive metabolic panel     Status: Abnormal   Collection Time: 05/04/18  6:26 AM  Result Value Ref Range   Sodium 138 135 - 145 mmol/L   Potassium 3.4 (L) 3.5 - 5.1 mmol/L   Chloride 98 98 - 111 mmol/L   CO2 29 22 - 32 mmol/L   Glucose, Bld 153 (H) 70 - 99 mg/dL   BUN 7 6 - 20 mg/dL   Creatinine, Ser 0.87 0.44 - 1.00 mg/dL   Calcium 8.7 (L) 8.9 - 10.3 mg/dL   Total Protein 7.5 6.5 - 8.1 g/dL   Albumin 3.7 3.5 - 5.0 g/dL   AST 15 15 - 41 U/L   ALT 16 0 - 44 U/L   Alkaline Phosphatase 59 38 - 126 U/L   Total Bilirubin 0.5 0.3 - 1.2 mg/dL   GFR calc non Af Amer >60 >60 mL/min   GFR calc Af Amer >60 >60 mL/min   Anion gap 11 5 - 15    Comment: Performed at [facility], [place] [address] [place], [place] [address]  TSH     Status: Abnormal   Collection Time: 05/04/18  6:26 AM  Result Value Ref  Range   TSH 5.758 (H) 0.350 - 4.500 uIU/mL    Comment: Performed by a 3rd Generation assay with a functional sensitivity of <=0.01 uIU/mL. Performed at [facility], [place] [address], [place], [place] [address]   Vitamin B12     Status: Abnormal   Collection Time: 05/04/18  6:26 AM  Result Value Ref Range   Vitamin B-12 165 (L) 180 - 914 pg/mL    Comment: (NOTE) This assay is not validated for testing neonatal or myeloproliferative syndrome specimens for Vitamin B12 levels. Performed at [facility] [place] [address], [place], [place] [address]   Folate     Status: None   Collection Time: 05/04/18  6:26 AM  Result Value Ref Range   Folate 9.7 >5.9 ng/mL    Comment: Performed at [facility], [place] [address], [place], [place] [address]  Glucose, capillary     Status: Abnormal   Collection Time: 05/04/18  6:44 AM  Result Value Ref Range   Glucose-Capillary 184 (H) 70 - 99 mg/dL   Comment 1 Notify RN    Comment 2 Document in Chart     Blood Alcohol level:  Lab Results  Component Value Date   ETH <10 05/01/2018   ETH <11 35/32/9924    Metabolic Disorder Labs: Lab Results  Component Value Date   HGBA1C 7.6 (H) 05/02/2018   MPG 171.42 05/02/2018   No results found for: PROLACTIN No results found  for: CHOL, TRIG, HDL, CHOLHDL, VLDL, LDLCALC  Physical Findings: AIMS: Facial and Oral Movements Muscles of Facial Expression: None, normal Lips and Perioral Area: None, normal Jaw: None, normal Tongue: None, normal,Extremity Movements Upper (arms, wrists, hands, fingers): None, normal Lower (legs, knees, ankles, toes): None, normal, Trunk Movements Neck, shoulders, hips: None, normal, Overall Severity Severity of abnormal movements (highest score from questions above): None, normal Incapacitation due to abnormal movements: None, normal Patient's awareness of abnormal movements (rate only patient's report): No Awareness,  Dental Status Current problems with teeth and/or dentures?: No Does patient usually wear dentures?: No  CIWA:  CIWA-Ar Total: 1 COWS:  COWS Total Score: 2  Musculoskeletal: Strength & Muscle Tone: within normal limits Gait & Station: normal Patient leans: N/A  Psychiatric Specialty Exam: Physical Exam  Nursing note and vitals reviewed. Constitutional: She is oriented to person, place, and time. She appears well-developed and well-nourished.  Cardiovascular: Normal rate.  Respiratory: Effort normal.  Neurological: She is alert and oriented to person, place, and time.    Review of Systems  Constitutional: Negative.   Respiratory: Negative.   Cardiovascular: Negative.   Psychiatric/Behavioral: Positive for depression. Negative for hallucinations, memory loss, substance abuse and suicidal ideas. The patient is nervous/anxious. The patient does not have insomnia.     Blood pressure (!) 120/91, pulse 95, temperature 99.5 F (37.5 C), temperature source Oral, resp. rate 18, height 5' (1.524 m), weight 108.4 kg, SpO2 100 %.Body mass index is 46.68 kg/m.  General Appearance: Fairly Groomed  Eye Contact:  Fair  Speech:  Clear and Coherent  Volume:  Normal  Mood:  Depressed  Affect:  Constricted but smiles at times appropriately   Thought Process:  Coherent  Orientation:  Full (Time, Place, and Person)  Thought Content:  WDL  Suicidal Thoughts:  No  Homicidal Thoughts:  No  Memory:  Immediate;   Fair  Judgement:  Fair  Insight:  Fair  Psychomotor Activity:  Normal  Concentration:  Concentration: Fair  Recall:  [employer] of Knowledge:  Fair  Language:  Good  Akathisia:  No  Handed:  Right  AIMS (if indicated):     Assets:  Communication Skills Desire for Improvement Social Support  ADL's:  Intact  Cognition:  WNL  Sleep:  Number of Hours: 5.75     Treatment Plan Summary: Daily contact with patient to assess and evaluate symptoms and progress in treatment and  Medication management   Continue inpatient hospitalization.  Mood: Continue Wellbutrin XL 150 mg PO daily Trileptal increased to 150 mg PO QAM and 300 mg PO QHS  Sleep: Continue trazodone 50 mg PO QHS PRN insomnia  Anxiety: Continue Vistaril 25 mg PO TID PRN anxiety  Other medical: Continue HCTZ 25 mg PO daily Continue clonidine 0.1 mg PO PRN SBP>160 Continue metformin 500 mg PO daily Continue Zaditor eye drops  Patient will participate in the therapeutic group milieu.  Discharge disposition in progress.   Connye Burkitt, NP 05/04/2018, 3:49 PM

## 2018-05-04 NOTE — Progress Notes (Signed)
CSW met with patient to complete assessment. Patient's goals include being established with financial resources and assistance with appealing her SS Disability denial. CSW explained that realistically, these goals may not be accomplished prior to discharge as they are time consuming and not services typically provided in an acute psychiatric care facility. Patient voices understanding.  CSW provided patient with printed resources with step-by-step instructions for appealing a disability denial. CSW provided printed appeal application and phone numbers to start the appeals process. Patient aware she has 60 days from her denial (reportedly dated 04/05/18) to appeal. Voices understanding of paperwork/process.   , LCSW-A Clinical Social Worker 

## 2018-05-04 NOTE — Progress Notes (Signed)
D:  Patient's self inventory sheet, patient has fair sleep, no sleep medication.  Good appetite, low energy, poor concentration.  Rated depression 8, hopeless and anxiety 10.  Denieed withdrawals.   SI off/on, contracts for safety, no plan.  Denied physical pain.  Goal is one step in front of today.  Plans to take another step.  No discharge plans. A:  Medications administered per MD orders.  Emotional support and encouragement given patient. R:  Denied HI.  Denied A/V hallucinations.  Patient denied SI while talking to nurse this morning.
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
## 2018-05-04 NOTE — BHH Group Notes (Signed)
Adult Psychoeducational Group Note  Date:  05/04/2018 Time:  9:28 PM  Group Topic/Focus:  Wrap-Up Group:   The focus of this group is to help patients review their daily goal of treatment and discuss progress on daily workbooks.  Participation Level:  Active  Participation Quality:  Appropriate and Attentive  Affect:  Appropriate  Cognitive:  Alert and Appropriate  Insight: Appropriate and Good  Engagement in Group:  Engaged  Modes of Intervention:  Discussion and Education  Additional Comments:  Pt attended and participated in wrap up group this evening. Pt had a "fair" day, rating it a 5/10. Pt day has been emotional and they have also been shaky today. Pt completed their goal, which was to "put on foot in front of the other". Pt is attempting to carry on with their life.    Cristi Loron 05/04/2018, 9:28 PM

## 2018-05-04 NOTE — Progress Notes (Signed)
D: Pt was in dayroom upon initial approach.  Pt presents with depressed affect and mood.  She describes her day as "not too good."  Pt reports "this morning I was really emotional and then at dinner I was shaky and my heart was racing.  I felt like my sugar was low."  Pt requested writer to check her CBG, which was 203 mg/dL. Pt denies SI/HI, denies hallucinations, denies pain.  Pt has been visible in milieu interacting with peers and staff appropriately.  Pt attended evening group.    A: Met with pt 1:1.  Actively listened to pt and offered support and encouragement. Medication administered per order.  Q15 minute safety checks maintained.  R: Pt is safe on the unit.  Pt is compliant with medication.  Pt verbally contracts for safety.  Will continue to monitor and assess.

## 2018-05-05 LAB — GLUCOSE, CAPILLARY
Glucose-Capillary: 125 mg/dL — ABNORMAL HIGH (ref 70–99)
Glucose-Capillary: 129 mg/dL — ABNORMAL HIGH (ref 70–99)
Glucose-Capillary: 194 mg/dL — ABNORMAL HIGH (ref 70–99)

## 2018-05-05 NOTE — Progress Notes (Signed)
D Pt is observed OOB UAL on the 400 hall today. She is cooperative, pleasant, but depressed. She makes good eye contact with this Probation officer. SHe is dressed appropriately in her own clothes. SHe interacts with staff and other patients and is seen sitting in the 400 hall dayroom socializing with her peers.    A SHe did complete her daily assessment and on this she wrote she has experienced suicidal ideation today and when writer asked her about this she stated " it's always there". SHe verbally contracted with Probation officer, to not hurt herslef and to come to writer for help, if she felt unable to stay safe. On her assessment, she rated her depression, hopelessness and anxiety " 09/24/08", respectively.     R Safety is in place and poc cont.

## 2018-05-05 NOTE — Progress Notes (Signed)
[facility] MD Progress Note  05/05/2018 11:30 AM Patricia Rodriguez  MRN:  [id number] Subjective:  "I'm doing good today. The medicine must be helping."  [name] found talking on the phone with her daughter. Presents with bright affect. Reports improved mood and decreased anxiety today. States she has been socializing more on the unit and will be participating in group therapy today. Denies current SI. States she has passive thoughts of death occasionally (most recently early this AM) but denies any suicidal intent or plan and contracts for safety on the unit. Reports improved sleep with trazodone. States she will be working on Radiographer, therapeutic for discharge. She does report an episode of lightheadedness before dinner last night- "I felt like my blood sugar dropped." Nursing note reviewed. CBG at that time was 203. AC CBG checks are ordered. Denies medication side effects. Support and encouragement provided.     From admission H&P: Patient is a 53 year old female with a reported past psychiatric history significant for depression, bipolar disorder, [place] phobia, generalized anxiety who presented to the [facility] emergency department on 1/13 after an intentional overdose of 24 -25 mg Benadryl tablets. It was estimated that this was approximately 600 mg. The patient stated that she has been attempting to get disability because of her psychiatric illness. She stated the instigating factor of this overdose was because she was turned down for disability. She is unable to financially support her self, and because of her psychiatric illness she is unable to continue to work. She stated that this had pushed her over the edge. The patient admitted that she had attempted suicide on 5 or 6 previous occasions. Her last serious overdose was approximately 4 years ago. The patient overdosed on Xanax at that time. Most recently she had been treated with Latuda, Wellbutrin XL and Xanax. Her last Xanax prescription was  in February 2018. She lost her insurance at that time, and has been unable to afford treatment since then. She stated in the past that she had periods of time where she would be awake for 2 3 days at a time without getting tired, but never left her apartment. She stated she had periods of time where her mood was elevated, but was unable to quantitate whether or not it was euphoria. She has a past medical history significant for diabetes mellitus type 2, hypertension, migraines, dry eyes. She was transferred to our facility for evaluation and stabilization.  Principal Problem: <principal problem not specified> Diagnosis: Active Problems:   Bipolar disorder (HCC)   Major depression, recurrent ([place])  Total Time spent with patient: 15 minutes  Past Psychiatric History: See admission H&P  Past Medical History:  Past Medical History:  Diagnosis Date  . Anxiety   . Depression   . Diabetes mellitus without complication ([place])   . Headache(784.0)   . Hypertension   . IBS (irritable bowel syndrome)   . Mental disorder     Past Surgical History:  Procedure Laterality Date  . NO PAST SURGERIES     Family History:  Family History  Problem Relation Age of Onset  . Depression Maternal Aunt   . Alcohol abuse Maternal Aunt   . Drug abuse Maternal Aunt   . Drug abuse Father   . Alcohol abuse Paternal Aunt   . Drug abuse Paternal Aunt   . Alcohol abuse Maternal Uncle   . Drug abuse Maternal Uncle   . Alcohol abuse Paternal Uncle   . Drug abuse Paternal Uncle  Family Psychiatric  History: See admission H&P Social History:  Social History   Substance and Sexual Activity  Alcohol Use Not Currently  . Alcohol/week: 0.0 standard drinks   Comment: socially     Social History   Substance and Sexual Activity  Drug Use No    Social History   Socioeconomic History  . Marital status: Divorced    Spouse name: Not on file  . Number of children: 1  . Years of education: Not on file   . Highest education level: Not on file  Occupational History  . Occupation: Unemployed  Social Needs  . Financial resource strain: Not on file  . Food insecurity:    Worry: Not on file    Inability: Not on file  . Transportation needs:    Medical: Not on file    Non-medical: Not on file  Tobacco Use  . Smoking status: Never Smoker  . Smokeless tobacco: Never Used  Substance and Sexual Activity  . Alcohol use: Not Currently    Alcohol/week: 0.0 standard drinks    Comment: socially  . Drug use: No  . Sexual activity: Not Currently  Lifestyle  . Physical activity:    Days per week: Not on file    Minutes per session: Not on file  . Stress: Not on file  Relationships  . Social connections:    Talks on phone: Not on file    Gets together: Not on file    Attends religious service: Not on file    Active member of club or organization: Not on file    Attends meetings of clubs or organizations: Not on file    Relationship status: Not on file  Other Topics Concern  . Not on file  Social History Narrative  . Not on file   Additional Social History:    History of alcohol / drug use?: No history of alcohol / drug abuse                    Sleep: Good  Appetite:  Good  Current Medications: Current Facility-Administered Medications  Medication Dose Route Frequency Provider Last Rate Last Dose  . alum & mag hydroxide-simeth (MAALOX/MYLANTA) 200-200-20 MG/5ML suspension 30 mL  30 mL Oral Q4H PRN Sharma Covert, MD      . antiseptic oral rinse (BIOTENE) solution 15 mL  15 mL Mouth Rinse PRN Sharma Covert, MD      . buPROPion (WELLBUTRIN XL) 24 hr tablet 150 mg  150 mg Oral Daily Sharma Covert, MD   150 mg at 05/05/18 0926  . cloNIDine (CATAPRES) tablet 0.1 mg  0.1 mg Oral TID PRN Sharma Covert, MD      . hydrOXYzine (ATARAX/VISTARIL) tablet 25 mg  25 mg Oral TID PRN Sharma Covert, MD   25 mg at 05/04/18 0905  . ibuprofen (ADVIL,MOTRIN) tablet 400  mg  400 mg Oral Q6H PRN Sharma Covert, MD   400 mg at 05/03/18 2106  . ketotifen (ZADITOR) 0.025 % ophthalmic solution 1 drop  1 drop Both Eyes BID Sharma Covert, MD   1 drop at 05/05/18 0926  . magnesium hydroxide (MILK OF MAGNESIA) suspension 30 mL  30 mL Oral Daily PRN Sharma Covert, MD      . metFORMIN (GLUCOPHAGE) tablet 500 mg  500 mg Oral Q breakfast Sharma Covert, MD   500 mg at 05/05/18 0926  . OXcarbazepine (TRILEPTAL) tablet 150 mg  150  mg Oral Daily Sharma Covert, MD   150 mg at 05/05/18 7412  . Oxcarbazepine (TRILEPTAL) tablet 300 mg  300 mg Oral QHS Sharma Covert, MD   300 mg at 05/04/18 2115  . traZODone (DESYREL) tablet 50 mg  50 mg Oral QHS PRN Sharma Covert, MD        Lab Results:  Results for orders placed or performed during the hospital encounter of 05/03/18 (from the past 48 hour(s))  Glucose, capillary     Status: Abnormal   Collection Time: 05/03/18  5:23 PM  Result Value Ref Range   Glucose-Capillary 135 (H) 70 - 99 mg/dL   Comment 1 Notify RN    Comment 2 Document in Chart   Comprehensive metabolic panel     Status: Abnormal   Collection Time: 05/04/18  6:26 AM  Result Value Ref Range   Sodium 138 135 - 145 mmol/L   Potassium 3.4 (L) 3.5 - 5.1 mmol/L   Chloride 98 98 - 111 mmol/L   CO2 29 22 - 32 mmol/L   Glucose, Bld 153 (H) 70 - 99 mg/dL   BUN 7 6 - 20 mg/dL   Creatinine, Ser 0.87 0.44 - 1.00 mg/dL   Calcium 8.7 (L) 8.9 - 10.3 mg/dL   Total Protein 7.5 6.5 - 8.1 g/dL   Albumin 3.7 3.5 - 5.0 g/dL   AST 15 15 - 41 U/L   ALT 16 0 - 44 U/L   Alkaline Phosphatase 59 38 - 126 U/L   Total Bilirubin 0.5 0.3 - 1.2 mg/dL   GFR calc non Af Amer >60 >60 mL/min   GFR calc Af Amer >60 >60 mL/min   Anion gap 11 5 - 15    Comment: Performed at [facility], [place] [address], [place], [place] [address]  TSH     Status: Abnormal   Collection Time: 05/04/18  6:26 AM  Result Value Ref Range   TSH 5.758 (H) 0.350 -  4.500 uIU/mL    Comment: Performed by a 3rd Generation assay with a functional sensitivity of <=0.01 uIU/mL. Performed at [facility], [place] [address], [place], [place] [address]   Vitamin B12     Status: Abnormal   Collection Time: 05/04/18  6:26 AM  Result Value Ref Range   Vitamin B-12 165 (L) 180 - 914 pg/mL    Comment: (NOTE) This assay is not validated for testing neonatal or myeloproliferative syndrome specimens for Vitamin B12 levels. Performed at [facility], [place] [address] [place], [place] [address]   Folate     Status: None   Collection Time: 05/04/18  6:26 AM  Result Value Ref Range   Folate 9.7 >5.9 ng/mL    Comment: Performed at [facility], [place] [address] [place], [place] [address]  Glucose, capillary     Status: Abnormal   Collection Time: 05/04/18  6:44 AM  Result Value Ref Range   Glucose-Capillary 184 (H) 70 - 99 mg/dL   Comment 1 Notify RN    Comment 2 Document in Chart   Glucose, capillary     Status: Abnormal   Collection Time: 05/04/18  6:08 PM  Result Value Ref Range   Glucose-Capillary 217 (H) 70 - 99 mg/dL   Comment 1 Notify RN    Comment 2 Document in Chart   Glucose, capillary     Status: Abnormal   Collection Time: 05/04/18  8:03 PM  Result Value Ref  Range   Glucose-Capillary 203 (H) 70 - 99 mg/dL  Glucose, capillary     Status: Abnormal   Collection Time: 05/05/18  6:04 AM  Result Value Ref Range   Glucose-Capillary 125 (H) 70 - 99 mg/dL   Comment 1 Notify RN    Comment 2 Document in Chart     Blood Alcohol level:  Lab Results  Component Value Date   ETH <10 05/01/2018   ETH <11 31/51/7616    Metabolic Disorder Labs: Lab Results  Component Value Date   HGBA1C 7.6 (H) 05/02/2018   MPG 171.42 05/02/2018   No results found for: PROLACTIN No results found for: CHOL, TRIG, HDL, CHOLHDL, VLDL, LDLCALC  Physical Findings: AIMS: Facial and Oral Movements Muscles of Facial  Expression: None, normal Lips and Perioral Area: None, normal Jaw: None, normal Tongue: None, normal,Extremity Movements Upper (arms, wrists, hands, fingers): None, normal Lower (legs, knees, ankles, toes): None, normal, Trunk Movements Neck, shoulders, hips: None, normal, Overall Severity Severity of abnormal movements (highest score from questions above): None, normal Incapacitation due to abnormal movements: None, normal Patient's awareness of abnormal movements (rate only patient's report): No Awareness, Dental Status Current problems with teeth and/or dentures?: No Does patient usually wear dentures?: No  CIWA:  CIWA-Ar Total: 1 COWS:  COWS Total Score: 2  Musculoskeletal: Strength & Muscle Tone: within normal limits Gait & Station: normal Patient leans: N/A  Psychiatric Specialty Exam: Physical Exam  Nursing note and vitals reviewed. Constitutional: She is oriented to person, place, and time. She appears well-developed and well-nourished.  HENT:  Head: Normocephalic.  Respiratory: Effort normal.  Neurological: She is alert and oriented to person, place, and time.    Review of Systems  Constitutional: Negative.   Respiratory: Negative.   Cardiovascular: Negative.   Psychiatric/Behavioral: Positive for depression (improving). Negative for hallucinations, memory loss, substance abuse and suicidal ideas. The patient is not nervous/anxious and does not have insomnia.     Blood pressure (!) 124/94, pulse (!) 116, temperature 98.3 F (36.8 C), temperature source Oral, resp. rate 16, height 5' (1.524 m), weight 108.4 kg, SpO2 100 %.Body mass index is 46.68 kg/m.  General Appearance: Well Groomed  Eye Contact:  Good  Speech:  Clear and Coherent  Volume:  Normal  Mood:  Euthymic  Affect:  Congruent and Full Range  Thought Process:  Coherent  Orientation:  Full (Time, Place, and Person)  Thought Content:  WDL  Suicidal Thoughts:  No  Homicidal Thoughts:  No  Memory:   Immediate;   Good  Judgement:  Fair  Insight:  Fair  Psychomotor Activity:  Normal  Concentration:  Concentration: Good  Recall:  Good  Fund of Knowledge:  Fair  Language:  Good  Akathisia:  No  Handed:  Right  AIMS (if indicated):     Assets:  Communication Skills Desire for Improvement Resilience Social Support  ADL's:  Intact  Cognition:  WNL  Sleep:  Number of Hours: 6.75     Treatment Plan Summary: Daily contact with patient to assess and evaluate symptoms and progress in treatment and Medication management   Continue inpatient hospitalization.  Mood: Continue Wellbutrin XL 150 mg PO daily Continue Trileptal 150 mg PO QAM and 300 mg PO QHS  Sleep: Continue trazodone 50 mg PO QHS PRN insomnia  Anxiety: Continue Vistaril 25 mg PO TID PRN anxiety  Other medical: Continue HCTZ 25 mg PO daily Continue clonidine 0.1 mg PO PRN SBP>160 Continue metformin 500 mg PO daily  Continue Zaditor eye drops  Patient will participate in the therapeutic group milieu.  Discharge disposition in progress.   Connye Burkitt, NP 05/05/2018, 11:30 AM

## 2018-05-05 NOTE — Progress Notes (Signed)
Patient ID: Patricia Rodriguez, female   DOB: 1966/03/26, 53 y.o.   MRN: [id number]   D: Patient pleasant on approach but talks about having a lot of anxiety. Reports she still has some SI thoughts at times but not since the am. Contracts on unit. Feels blood sugars have been better today since they have not gotten low. Had to wake her up to give Trileptal because the Vistaril mad her a little drowsy tonight. A: Staff will continue to monitor on q 15 minute checks, follow treatment plan, and give medications as ordered. R: Cooperative and took medications.etter today since they have not gotten low.

## 2018-05-05 NOTE — [facility] Suicide Risk Assessment (Signed)
[place] INPATIENT:  Family/Significant Other Suicide Prevention Education  Suicide Prevention Education:  Education Completed; with daughter, [name] [phone]) has been identified by the patient as the family member/significant other with whom the patient will be residing, and identified as the person(s) who will aid the patient in the event of a mental health crisis (suicidal ideations/suicide attempt).  With written consent from the patient, the family member/significant other has been provided the following suicide prevention education, prior to the and/or following the discharge of the patient.  The suicide prevention education provided includes the following:  Suicide risk factors  Suicide prevention and interventions  National Suicide Hotline telephone number  [facility] assessment telephone number  [facility] Emergency Assistance [place] and/or Residential Mobile Crisis Unit telephone number  Request made of family/significant other to:  Remove weapons (e.g., guns, rifles, knives), all items previously/currently identified as safety concern.    Remove drugs/medications (over-the-counter, prescriptions, illicit drugs), all items previously/currently identified as a safety concern.  The family member/significant other verbalizes understanding of the suicide prevention education information provided.  The family member/significant other agrees to remove the items of safety concern listed above.  Daughter states she does not believe patient has excess supplies of medications in the home, citing that patient stopped refilling her prescriptions in 2018.  Daughter inquired if patient could get assistance with appealing for disability and if home health and in home counseling services are available to patient.  No other concerns, no safety concerns. Daughter states she will be able to pick up patient on Monday if patient is ready to discharge  then.  Joellen Jersey 05/05/2018, 11:31 AM

## 2018-05-05 NOTE — Tx Team (Signed)
Interdisciplinary Treatment and Diagnostic Plan Update  05/05/2018 Time of Session: 9:15am Patricia Rodriguez MRN: [id number]  Principal Diagnosis: <principal problem not specified>  Secondary Diagnoses: Active Problems:   Bipolar disorder ([place])   Major depression, recurrent ([place])   Current Medications:  Current Facility-Administered Medications  Medication Dose Route Frequency Provider Last Rate Last Dose  . alum & mag hydroxide-simeth (MAALOX/MYLANTA) 200-200-20 MG/5ML suspension 30 mL  30 mL Oral Q4H PRN Sharma Covert, MD      . antiseptic oral rinse (BIOTENE) solution 15 mL  15 mL Mouth Rinse PRN Sharma Covert, MD      . buPROPion (WELLBUTRIN XL) 24 hr tablet 150 mg  150 mg Oral Daily Sharma Covert, MD   150 mg at 05/05/18 0926  . cloNIDine (CATAPRES) tablet 0.1 mg  0.1 mg Oral TID PRN Sharma Covert, MD      . hydrOXYzine (ATARAX/VISTARIL) tablet 25 mg  25 mg Oral TID PRN Sharma Covert, MD   25 mg at 05/04/18 0905  . ibuprofen (ADVIL,MOTRIN) tablet 400 mg  400 mg Oral Q6H PRN Sharma Covert, MD   400 mg at 05/03/18 2106  . ketotifen (ZADITOR) 0.025 % ophthalmic solution 1 drop  1 drop Both Eyes BID Sharma Covert, MD   1 drop at 05/05/18 0926  . magnesium hydroxide (MILK OF MAGNESIA) suspension 30 mL  30 mL Oral Daily PRN Sharma Covert, MD      . metFORMIN (GLUCOPHAGE) tablet 500 mg  500 mg Oral Q breakfast Sharma Covert, MD   500 mg at 05/05/18 0926  . OXcarbazepine (TRILEPTAL) tablet 150 mg  150 mg Oral Daily Sharma Covert, MD   150 mg at 05/05/18 0926  . Oxcarbazepine (TRILEPTAL) tablet 300 mg  300 mg Oral QHS Sharma Covert, MD   300 mg at 05/04/18 2115  . traZODone (DESYREL) tablet 50 mg  50 mg Oral QHS PRN Sharma Covert, MD       PTA Medications: Medications Prior to Admission  Medication Sig Dispense Refill Last Dose  . acetaminophen (TYLENOL) 325 MG tablet Take 2 tablets (650 mg total) by mouth every 6 (six) hours as  needed for moderate pain, fever or headache.     Marland Kitchen amLODipine (NORVASC) 10 MG tablet Take 1 tablet (10 mg total) by mouth daily.       Patient Stressors: Financial difficulties Health problems Medication change or noncompliance Occupational concerns  Patient Strengths: Ability for insight Average or above average intelligence Capable of independent living Communication skills General fund of knowledge  Treatment Modalities: Medication Management, Group therapy, Case management,  1 to 1 session with clinician, Psychoeducation, Recreational therapy.   Physician Treatment Plan for Primary Diagnosis: <principal problem not specified> Long Term Goal(s): Improvement in symptoms so as ready for discharge Improvement in symptoms so as ready for discharge   Short Term Goals: Ability to identify changes in lifestyle to reduce recurrence of condition will improve Ability to verbalize feelings will improve Ability to disclose and discuss suicidal ideas Ability to demonstrate self-control will improve Ability to identify and develop effective coping behaviors will improve Ability to maintain clinical measurements within normal limits will improve Compliance with prescribed medications will improve Ability to identify changes in lifestyle to reduce recurrence of condition will improve Ability to verbalize feelings will improve Ability to disclose and discuss suicidal ideas Ability to demonstrate self-control will improve Ability to identify and develop effective coping behaviors will improve  Ability to maintain clinical measurements within normal limits will improve Compliance with prescribed medications will improve  Medication Management: Evaluate patient's response, side effects, and tolerance of medication regimen.  Therapeutic Interventions: 1 to 1 sessions, Unit Group sessions and Medication administration.  Evaluation of Outcomes: Progressing  Physician Treatment Plan for  Secondary Diagnosis: Active Problems:   Bipolar disorder ([place])   Major depression, recurrent ([place])  Long Term Goal(s): Improvement in symptoms so as ready for discharge Improvement in symptoms so as ready for discharge   Short Term Goals: Ability to identify changes in lifestyle to reduce recurrence of condition will improve Ability to verbalize feelings will improve Ability to disclose and discuss suicidal ideas Ability to demonstrate self-control will improve Ability to identify and develop effective coping behaviors will improve Ability to maintain clinical measurements within normal limits will improve Compliance with prescribed medications will improve Ability to identify changes in lifestyle to reduce recurrence of condition will improve Ability to verbalize feelings will improve Ability to disclose and discuss suicidal ideas Ability to demonstrate self-control will improve Ability to identify and develop effective coping behaviors will improve Ability to maintain clinical measurements within normal limits will improve Compliance with prescribed medications will improve     Medication Management: Evaluate patient's response, side effects, and tolerance of medication regimen.  Therapeutic Interventions: 1 to 1 sessions, Unit Group sessions and Medication administration.  Evaluation of Outcomes: Progressing   RN Treatment Plan for Primary Diagnosis: <principal problem not specified> Long Term Goal(s): Knowledge of disease and therapeutic regimen to maintain health will improve  Short Term Goals: Ability to participate in decision making will improve, Ability to verbalize feelings will improve, Ability to disclose and discuss suicidal ideas and Ability to identify and develop effective coping behaviors will improve  Medication Management: RN will administer medications as ordered by provider, will assess and evaluate patient's response and provide education to patient for  prescribed medication. RN will report any adverse and/or side effects to prescribing provider.  Therapeutic Interventions: 1 on 1 counseling sessions, Psychoeducation, Medication administration, Evaluate responses to treatment, Monitor vital signs and CBGs as ordered, Perform/monitor CIWA, COWS, AIMS and Fall Risk screenings as ordered, Perform wound care treatments as ordered.  Evaluation of Outcomes: Progressing   LCSW Treatment Plan for Primary Diagnosis: <principal problem not specified> Long Term Goal(s): Safe transition to appropriate next level of care at discharge, Engage patient in therapeutic group addressing interpersonal concerns.  Short Term Goals: Engage patient in aftercare planning with referrals and resources  Therapeutic Interventions: Assess for all discharge needs, 1 to 1 time with Social worker, Explore available resources and support systems, Assess for adequacy in community support network, Educate family and significant other(s) on suicide prevention, Complete Psychosocial Assessment, Interpersonal group therapy.  Evaluation of Outcomes: Adequate for Discharge   Progress in Treatment: Attending groups: Yes. Participating in groups: Yes. Taking medication as prescribed: Yes. Toleration medication: Yes. Family/Significant other contact made: No, will contact:  patient declined consent for collateral contacts Patient understands diagnosis: Yes. Discussing patient identified problems/goals with staff: Yes. Medical problems stabilized or resolved: Yes. Denies suicidal/homicidal ideation: Yes. Issues/concerns per patient self-inventory: No. Other:   New problem(s) identified: None   New Short Term/Long Term Goal(s): medication stabilization, elimination of SI thoughts, development of comprehensive mental wellness plan.   Patient Goals:  "I want to have a solid plan for living outside the hospital without consistent thoughts of suicide"   Discharge Plan or  Barriers: Patient plans to discharge home  and follow up with [facility] for outpatient medication management and therapy services. CSW will continue to follow and assess for appropriate referrals and discharge planning.   Reason for Continuation of Hospitalization: Anxiety Depression Medication stabilization Suicidal ideation  Estimated Length of Stay: 05/10/2018  Attendees: Patient: Patricia Rodriguez 05/05/2018 11:29 AM  Physician: Dr. Myles Lipps, MD 05/05/2018 11:29 AM  Nursing: Chong Sicilian. D, RN 05/05/2018 11:29 AM  RN Care Manager: 05/05/2018 11:29 AM  Social Worker: Radonna Ricker, [place] 05/05/2018 11:29 AM  Recreational Therapist:  05/05/2018 11:29 AM  Other: Harriett Sine, NP  05/05/2018 11:29 AM  Other:  05/05/2018 11:29 AM  Other: 05/05/2018 11:29 AM    Scribe for Treatment Team: Marylee Floras, [place] 05/05/2018 11:29 AM

## 2018-05-05 NOTE — Progress Notes (Signed)
Adult Psychoeducational Group Note  Date:  05/05/2018 Time:  9:16 PM  Group Topic/Focus:  Wrap-Up Group:   The focus of this group is to help patients review their daily goal of treatment and discuss progress on daily workbooks.  Participation Level:  Active  Participation Quality:  Appropriate  Affect:  Appropriate  Cognitive:  Appropriate  Insight: Appropriate  Engagement in Group:  Engaged  Modes of Intervention:  Discussion  Additional Comments:  Pt stated that today was a good day. Pt had visitors this evening and she felt good about that. Pt did complain of there not being any groups besides wrap-up group within the past two days.   Wynelle Fanny R 05/05/2018, 9:16 PM

## 2018-05-05 NOTE — Plan of Care (Signed)
  Problem: Education: Goal: Knowledge of [facility] Education information/materials will improve Outcome: Progressing   

## 2018-05-05 NOTE — Progress Notes (Signed)
Recreation Therapy Notes  Date: 1.17.20 Time: 0930 Location: 300 Hall Dayroom  Group Topic: Stress Management  Goal Area(s) Addresses:  Patient will identify stress management techniques. Patient will identify benefit of using stress management post d/c.  Intervention:  Stress Management  Activity :  Progressive Muscle Relaxation.  LRT introduced the stress management technique of progressive muscle relaxation.  LRT lead the group in tensing and relaxing each muscle individually.  Patients were to follow along as scrip was read to engage in activity.  Education:  Stress Management, Discharge Planning.   Education Outcome: Acknowledges Education  Clinical Observations/Feedback: Pt did not attend group.     Victorino Sparrow, LRT/CTRS         Victorino Sparrow A 05/05/2018 11:38 AM

## 2018-05-06 LAB — GLUCOSE, CAPILLARY
GLUCOSE-CAPILLARY: 135 mg/dL — AB (ref 70–99)
Glucose-Capillary: 130 mg/dL — ABNORMAL HIGH (ref 70–99)
Glucose-Capillary: 126 mg/dL — ABNORMAL HIGH (ref 70–99)
Glucose-Capillary: 179 mg/dL — ABNORMAL HIGH (ref 70–99)

## 2018-05-06 MED ORDER — OXCARBAZEPINE 150 MG PO TABS
75.0000 mg | ORAL_TABLET | Freq: Once | ORAL | Status: AC
  Administered 2018-05-06: 75 mg via ORAL
  Filled 2018-05-06: qty 0.5

## 2018-05-06 MED ORDER — OXCARBAZEPINE 150 MG PO TABS
450.0000 mg | ORAL_TABLET | Freq: Every day | ORAL | Status: DC
  Administered 2018-05-06 – 2018-05-07 (×2): 450 mg via ORAL
  Filled 2018-05-06 (×3): qty 3

## 2018-05-06 MED ORDER — OXCARBAZEPINE 150 MG PO TABS
225.0000 mg | ORAL_TABLET | Freq: Every day | ORAL | Status: DC
  Filled 2018-05-06 (×2): qty 1.5

## 2018-05-06 NOTE — Progress Notes (Signed)
D Patient is observed OOB UAL on the 400 hall today. She tolerates this well. She tells this Probation officer " boy I was really evil this morning...ibuprofen am NOT a morning person!". She wears her own clothes, her affect is direct and animated, she makes good eye contact.    A SHe completed her daily assessment and on this she wrote she denied having suicidal ideations today. She does not rate her depression, hopelessness and anxiety. She chose not to attend her Life SKills group this afternoon. \   R Safeyt is in place.

## 2018-05-06 NOTE — BHH Group Notes (Signed)
[place] Group Notes:  (Nursing)  Date:  05/06/2018  Time:  8:30 am Type of Therapy:  Orientation  Participation Level:  Active  Participation Quality:  Appropriate and Attentive  Affect:  Appropriate  Cognitive:  Appropriate  Insight:  Appropriate and Good  Engagement in Group:  Engaged  Modes of Intervention:  Orientation  Summary of Progress/Problems:  Patricia Rodriguez 05/06/2018, 9:24 AM

## 2018-05-06 NOTE — Progress Notes (Signed)
[facility] MD Progress Note  05/06/2018 1:06 PM Patricia Rodriguez  MRN:  [id number] Subjective:  "I'm kind of blah today."  Patricia Rodriguez found resting in bed. Reports good sleep last night but woke up with irritable mood, no known triggers. Continued irritability today- states she is tired of being around other people and does not want to talk to anyone right now. Isolating to her room. States her anxiety levels are manageable but feels she needs medication adjustment for mood. Denies SI. Denies medication side effects.   From admission H&P:Patient is a 53 year old female with a reported past psychiatric history significant for depression, bipolar disorder, [place] phobia, generalized anxiety who presented to the [facility] emergency department on 1/13 after an intentional overdose of 24 -25 mg Benadryl tablets. It was estimated that this was approximately 600 mg. The patient stated that she has been attempting to get disability because of her psychiatric illness. She stated the instigating factor of this overdose was because she was turned down for disability. She is unable to financially support her self, and because of her psychiatric illness she is unable to continue to work. She stated that this had pushed her over the edge. The patient admitted that she had attempted suicide on 5 or 6 previous occasions. Her last serious overdose was approximately 4 years ago. The patient overdosed on Xanax at that time. Most recently she had been treated with Latuda, Wellbutrin XL and Xanax. Her last Xanax prescription was in February 2018. She lost her insurance at that time, and has been unable to afford treatment since then. She stated in the past that she had periods of time where she would be awake for 2 3 days at a time without getting tired, but never left her apartment. She stated she had periods of time where her mood was elevated, but was unable to quantitate whether or not it was euphoria. She has a  past medical history significant for diabetes mellitus type 2, hypertension, migraines, dry eyes. She was transferred to our facility for evaluation and stabilization.  Principal Problem: Bipolar disorder ([place]) Diagnosis: Principal Problem:   Bipolar disorder ([place]) Active Problems:   Major depression, recurrent ([place])  Total Time spent with patient: 15 minutes  Past Psychiatric History: See admission H&P  Past Medical History:  Past Medical History:  Diagnosis Date  . Anxiety   . Depression   . Diabetes mellitus without complication ([place])   . Headache(784.0)   . Hypertension   . IBS (irritable bowel syndrome)   . Mental disorder     Past Surgical History:  Procedure Laterality Date  . NO PAST SURGERIES     Family History:  Family History  Problem Relation Age of Onset  . Depression Maternal Aunt   . Alcohol abuse Maternal Aunt   . Drug abuse Maternal Aunt   . Drug abuse Father   . Alcohol abuse Paternal Aunt   . Drug abuse Paternal Aunt   . Alcohol abuse Maternal Uncle   . Drug abuse Maternal Uncle   . Alcohol abuse Paternal Uncle   . Drug abuse Paternal Uncle    Family Psychiatric  History: See admission H&P Social History:  Social History   Substance and Sexual Activity  Alcohol Use Not Currently  . Alcohol/week: 0.0 standard drinks   Comment: socially     Social History   Substance and Sexual Activity  Drug Use No    Social History   Socioeconomic History  . Marital status: Divorced  Spouse name: Not on file  . Number of children: 1  . Years of education: Not on file  . Highest education level: Not on file  Occupational History  . Occupation: Unemployed  Social Needs  . Financial resource strain: Not on file  . Food insecurity:    Worry: Not on file    Inability: Not on file  . Transportation needs:    Medical: Not on file    Non-medical: Not on file  Tobacco Use  . Smoking status: Never Smoker  . Smokeless tobacco: Never Used   Substance and Sexual Activity  . Alcohol use: Not Currently    Alcohol/week: 0.0 standard drinks    Comment: socially  . Drug use: No  . Sexual activity: Not Currently  Lifestyle  . Physical activity:    Days per week: Not on file    Minutes per session: Not on file  . Stress: Not on file  Relationships  . Social connections:    Talks on phone: Not on file    Gets together: Not on file    Attends religious service: Not on file    Active member of club or organization: Not on file    Attends meetings of clubs or organizations: Not on file    Relationship status: Not on file  Other Topics Concern  . Not on file  Social History Narrative  . Not on file   Additional Social History:    History of alcohol / drug use?: No history of alcohol / drug abuse                    Sleep: Good  Appetite:  Good  Current Medications: Current Facility-Administered Medications  Medication Dose Route Frequency Provider Last Rate Last Dose  . alum & mag hydroxide-simeth (MAALOX/MYLANTA) 200-200-20 MG/5ML suspension 30 mL  30 mL Oral Q4H PRN Sharma Covert, MD      . antiseptic oral rinse (BIOTENE) solution 15 mL  15 mL Mouth Rinse PRN Sharma Covert, MD   15 mL at 05/05/18 1714  . buPROPion (WELLBUTRIN XL) 24 hr tablet 150 mg  150 mg Oral Daily Sharma Covert, MD   150 mg at 05/06/18 1150  . cloNIDine (CATAPRES) tablet 0.1 mg  0.1 mg Oral TID PRN Sharma Covert, MD      . hydrOXYzine (ATARAX/VISTARIL) tablet 25 mg  25 mg Oral TID PRN Sharma Covert, MD   25 mg at 05/05/18 2041  . ibuprofen (ADVIL,MOTRIN) tablet 400 mg  400 mg Oral Q6H PRN Sharma Covert, MD   400 mg at 05/03/18 2106  . ketotifen (ZADITOR) 0.025 % ophthalmic solution 1 drop  1 drop Both Eyes BID Sharma Covert, MD   1 drop at 05/06/18 1149  . magnesium hydroxide (MILK OF MAGNESIA) suspension 30 mL  30 mL Oral Daily PRN Sharma Covert, MD      . metFORMIN (GLUCOPHAGE) tablet 500 mg  500 mg  Oral Q breakfast Sharma Covert, MD   500 mg at 05/06/18 1150  . [START ON 05/07/2018] OXcarbazepine (TRILEPTAL) tablet 225 mg  225 mg Oral Daily Connye Burkitt, NP      . OXcarbazepine (TRILEPTAL) tablet 450 mg  450 mg Oral QHS Connye Burkitt, NP      . OXcarbazepine (TRILEPTAL) tablet 75 mg  75 mg Oral Once Connye Burkitt, NP      . traZODone (DESYREL) tablet 50 mg  50  mg Oral QHS PRN Sharma Covert, MD        Lab Results:  Results for orders placed or performed during the hospital encounter of 05/03/18 (from the past 48 hour(s))  Glucose, capillary     Status: Abnormal   Collection Time: 05/04/18  6:08 PM  Result Value Ref Range   Glucose-Capillary 217 (H) 70 - 99 mg/dL   Comment 1 Notify RN    Comment 2 Document in Chart   Glucose, capillary     Status: Abnormal   Collection Time: 05/04/18  8:03 PM  Result Value Ref Range   Glucose-Capillary 203 (H) 70 - 99 mg/dL  Glucose, capillary     Status: Abnormal   Collection Time: 05/05/18  6:04 AM  Result Value Ref Range   Glucose-Capillary 125 (H) 70 - 99 mg/dL   Comment 1 Notify RN    Comment 2 Document in Chart   Glucose, capillary     Status: Abnormal   Collection Time: 05/05/18  5:11 PM  Result Value Ref Range   Glucose-Capillary 129 (H) 70 - 99 mg/dL  Glucose, capillary     Status: Abnormal   Collection Time: 05/05/18  8:10 PM  Result Value Ref Range   Glucose-Capillary 194 (H) 70 - 99 mg/dL  Glucose, capillary     Status: Abnormal   Collection Time: 05/06/18  6:11 AM  Result Value Ref Range   Glucose-Capillary 130 (H) 70 - 99 mg/dL  Glucose, capillary     Status: Abnormal   Collection Time: 05/06/18 11:32 AM  Result Value Ref Range   Glucose-Capillary 135 (H) 70 - 99 mg/dL    Blood Alcohol level:  Lab Results  Component Value Date   ETH <10 05/01/2018   ETH <11 30/86/5784    Metabolic Disorder Labs: Lab Results  Component Value Date   HGBA1C 7.6 (H) 05/02/2018   MPG 171.42 05/02/2018   No results  found for: PROLACTIN No results found for: CHOL, TRIG, HDL, CHOLHDL, VLDL, LDLCALC  Physical Findings: AIMS: Facial and Oral Movements Muscles of Facial Expression: None, normal Lips and Perioral Area: None, normal Jaw: None, normal Tongue: None, normal,Extremity Movements Upper (arms, wrists, hands, fingers): None, normal Lower (legs, knees, ankles, toes): None, normal, Trunk Movements Neck, shoulders, hips: None, normal, Overall Severity Severity of abnormal movements (highest score from questions above): None, normal Incapacitation due to abnormal movements: None, normal Patient's awareness of abnormal movements (rate only patient's report): No Awareness, Dental Status Current problems with teeth and/or dentures?: No Does patient usually wear dentures?: No  CIWA:  CIWA-Ar Total: 1 COWS:  COWS Total Score: 2  Musculoskeletal: Strength & Muscle Tone: within normal limits Gait & Station: normal Patient leans: N/A  Psychiatric Specialty Exam: Physical Exam  Nursing note and vitals reviewed. Constitutional: She is oriented to person, place, and time. She appears well-developed and well-nourished.  Respiratory: Effort normal.  Neurological: She is alert and oriented to person, place, and time.    Review of Systems  Constitutional: Negative.   Respiratory: Negative.   Cardiovascular: Negative.   Psychiatric/Behavioral: Positive for depression. Negative for hallucinations, memory loss, substance abuse and suicidal ideas. The patient is not nervous/anxious and does not have insomnia.     Blood pressure 121/87, pulse (!) 111, temperature 98.4 F (36.9 C), resp. rate 16, height 5' (1.524 m), weight 108.4 kg, SpO2 100 %.Body mass index is 46.68 kg/m.  General Appearance: Well Groomed  Eye Contact:  Fair  Speech:  Normal Rate  Volume:  Increased  Mood:  Irritable  Affect:  Appropriate and Full Range  Thought Process:  Coherent  Orientation:  Full (Time, Place, and Person)   Thought Content:  WDL  Suicidal Thoughts:  No  Homicidal Thoughts:  No  Memory:  Immediate;   Good  Judgement:  Fair  Insight:  Fair  Psychomotor Activity:  Normal  Concentration:  Concentration: Good  Recall:  [place] of Knowledge:  Fair  Language:  Good  Akathisia:  No  Handed:  Right  AIMS (if indicated):     Assets:  Communication Skills Desire for Improvement Leisure Time Social Support  ADL's:  Intact  Cognition:  WNL  Sleep:  Number of Hours: 6.25     Treatment Plan Summary: Daily contact with patient to assess and evaluate symptoms and progress in treatment and Medication management  Continue inpatient hospitalization.  Mood: One-time dose of Trileptal 75 mg PO now Increase Trileptal to 225 mg PO QAM and 450 mg PO QHS  Sleep: Continue trazodone 50 mg PO QHS PRN insomnia  Anxiety: Continue Vistaril 25 mg PO TID PRN anxiety  Other medical: Continue HCTZ 25 mg PO daily Continue clonidine 0.1 mg PO PRN SBP>160 Continue metformin 500 mg PO daily Continue Zaditor eye drops  Patient will participate in the therapeutic group milieu.  Discharge disposition in progress.   Connye Burkitt, NP 05/06/2018, 1:06 PM

## 2018-05-06 NOTE — Plan of Care (Signed)
  Problem: Education: Goal: Knowledge of [facility] Education information/materials will improve Outcome: Progressing   

## 2018-05-06 NOTE — B[facility] Group Notes (Signed)
[facility] Group Notes:  (Nursing)  Date:  05/06/2018  Time:  1:00 PM Type of Therapy:  Nurse Education  Participation Level:  Did Not Attend   Waymond Cera 05/06/2018, 1:34 PM

## 2018-05-06 NOTE — B[facility] Group Notes (Signed)
LCSW Group Therapy Note  05/06/2018    10:00-11:00am   Type of Therapy and Topic:  Group Therapy: Early Messages Received About Anger  Participation Level:  Active   Description of Group:   In this group, patients shared and discussed the early messages received in their lives about anger through parental or other adult modeling, teaching, repression, punishment, violence, and more.  Participants identified how those childhood lessons influence even now how they usually or often react when angered.  The group discussed that anger is a secondary emotion and what may be the underlying emotional themes that come out through anger outbursts or that are ignored through anger suppression.  Finally, as a group there was a conversation about the workbook's quote that "There is nothing wrong with anger; it is just a sign something needs to change."     Therapeutic Goals: 1. Patients will identify one or more childhood message about anger that they received and how it was taught to them. 2. Patients will discuss how these childhood experiences have influenced and continue to influence their own expression or repression of anger even today. 3. Patients will explore possible primary emotions that tend to fuel their secondary emotion of anger. 4. Patients will learn that anger itself is normal and cannot be eliminated, and that healthier coping skills can assist with resolving conflict rather than worsening situations.  Summary of Patient Progress:  The patient shared that her childhood lessons about anger were learned from relatives who would "erupt" in violent confrontations for now reason.  As a result, she avoids showing her anger to anyone.  She talked about preferring "a cold anger" which was a phrase that other patients liked.  She was quite supportive of other patients throughout group, particularly those who also deny themselves the feeling of anger.  Therapeutic Modalities:   Cognitive Behavioral  Therapy Motivation Interviewing  Maretta Los  .

## 2018-05-06 NOTE — Progress Notes (Signed)
The patient rated her day as a 5 out of a possible 10. She states that she has not been feeling well with an upset stomach. Her goal for tomorrow is to attend all of her groups.

## 2018-05-07 LAB — GLUCOSE, CAPILLARY
Glucose-Capillary: 109 mg/dL — ABNORMAL HIGH (ref 70–99)
Glucose-Capillary: 133 mg/dL — ABNORMAL HIGH (ref 70–99)
Glucose-Capillary: 99 mg/dL (ref 70–99)

## 2018-05-07 MED ORDER — OXCARBAZEPINE 150 MG PO TABS
150.0000 mg | ORAL_TABLET | Freq: Every day | ORAL | Status: DC
  Administered 2018-05-08 – 2018-05-09 (×2): 150 mg via ORAL
  Filled 2018-05-07 (×3): qty 1

## 2018-05-07 NOTE — [facility] Group Notes (Signed)
[facility] LCSW Group Therapy Note  Date/Time:    05/07/2018 10:00-11:00AM  Type of Therapy and Topic:  Group Therapy:  Practicing Self-Kindness  Participation Level:  Active   Description of Group:  The focus of this group is to examine human tendencies to be hyper critical of self and how this leads to feelings of worthlessness, hopelessness, and shame.  Patients were guided to the concept that shame is universal and worsened by being kept hidden but improved by being revealed.  We discussed how not feeling worthy is the result of shame and discussed the differences between guilt and shame.  Part of a song "Worth It" was played to encourage people to think differently about worth.  It was shared why it is important to build the ability to tolerate discomfort, since numbing emotions unfortunately applies to both painful and positive feelings and is not selective.  Gratitude was linked to joy and a variety of coping methods were suggested.  Multiple exercises were led to experience a shortened version of several of the skills described.  A song was played at the end of group entitled "I am enough."  Therapeutic Goals 1. Identify statements patients automatically say to themselves, "I'll be worthy when...." and examine how this is not kind to self because it indicates we cannot be worthy until some far-reaching goal is achieved. 2. Share current healthy and unhealthy coping skills used. 3. Practice multiple coping skills including a. Questioning whether they feel guilty ("I did something bad") or shame ("I am bad") and whether this feeling is based in fact. b. 5 senses mindfulness c. Camera (zoom in and out) d. Grounding  e. AEIOUY f. TGIF 4. Learn about research linking gratitude to joy, and the difference between happiness and joy. 5. Encourage to do the needed work to Estate manager/land agent, focusing on how medication is part of the solution to emotional and mental problems, while coping skills are  necessary to actually change behavior.  Summary of Patient Progress: During group, patient expressed that a current healthy coping skill used is listening to music loudly while a current unhealthy coping skill used is putting herself down for her weight.  Patient was one of the most participatory group members.  She engaged in each and every exercises and indicated a willingness to at least try them at home.   Therapeutic Modalities Activity Processing Lecture   Selmer Dominion, LCSW 05/07/2018, 1:57 PM

## 2018-05-07 NOTE — Progress Notes (Signed)
Adult Psychoeducational Group Note  Date:  05/07/2018 Time:  9:16 PM  Group Topic/Focus:  Wrap-Up Group:   The focus of this group is to help patients review their daily goal of treatment and discuss progress on daily workbooks.  Participation Level:  Active  Participation Quality:  Appropriate  Affect:  Appropriate  Cognitive:  Appropriate  Insight: Appropriate  Engagement in Group:  Engaged  Modes of Intervention:  Discussion  Additional Comments:  Patient attended group and participated.   Patricia Rodriguez W [place] 5/68/6168, 9:16 PM

## 2018-05-07 NOTE — Progress Notes (Signed)
D Pt is seen [facility] on the [address] hall. SHe spends most of her time in her room ( due to her agoraphobia, she says).     A He raffect is flat, but cooeprative. SHe completed her daily assessment and on this she wrote she has had  SI today, but she contracted with this writer to not act on this.  and she rated her depression, hopelessness and anxeity " 10/26/08", respectively . She says shes" getting ready" to be discharged home tomorrow and say " I know I gotta go sooner or later".      R Safety is in place

## 2018-05-07 NOTE — Plan of Care (Signed)
  Problem: Education: Goal: Knowledge of [facility] Education information/materials will improve Outcome: Progressing   

## 2018-05-07 NOTE — [facility] Group Notes (Signed)
[facility] Group Notes:  (Nursing/MHT/Case Management/Adjunct)  Date:  05/07/2018  Time:  9:23 AM  Type of Therapy:  Nurse Education  Participation Level:  Active  Participation Quality:  Appropriate  Affect:  Appropriate  Cognitive:  Appropriate  Insight:  Appropriate  Engagement in Group:  Engaged  Modes of Intervention:  Education  Summary of Progress/Problems:  Patient was alert, oriented and participated in group this morning.    Cammy Copa 05/07/2018, 9:23 AM

## 2018-05-07 NOTE — Progress Notes (Signed)
[facility] MD Progress Note  05/07/2018 9:46 AM Patricia Rodriguez  MRN:  [id number] Subjective:  "I'm sleepy this morning."  Patricia Rodriguez found resting in bed. Appears mildly fatigued. Reports good sleep last night but remains sleepy this morning. Denies irritability, depression, anxiety. Reports good mood and denies SI. States she had some indigestion with diarrhea yesterday afternoon but PRN Maalox was therapeutic. She was encouraged to increase fluid intake. She reports past history of IBS and states she has GI upset at home about once a week but does not take any medication for this. Denies further indigestion, diarrhea, N/V today. Denies lightheadedness. Denies medication side effects.  From admission H&P:Patient is a 53 year old female with a reported past psychiatric history significant for depression, bipolar disorder, [place] phobia, generalized anxiety who presented to the [facility] emergency department on 1/13 after an intentional overdose of 24 -25 mg Benadryl tablets. It was estimated that this was approximately 600 mg. The patient stated that she has been attempting to get disability because of her psychiatric illness. She stated the instigating factor of this overdose was because she was turned down for disability. She is unable to financially support her self, and because of her psychiatric illness she is unable to continue to work. She stated that this had pushed her over the edge. The patient admitted that she had attempted suicide on 5 or 6 previous occasions. Her last serious overdose was approximately 4 years ago. The patient overdosed on Xanax at that time. Most recently she had been treated with Latuda, Wellbutrin XL and Xanax. Her last Xanax prescription was in February 2018. She lost her insurance at that time, and has been unable to afford treatment since then. She stated in the past that she had periods of time where she would be awake for 2 3 days at a time without getting  tired, but never left her apartment. She stated she had periods of time where her mood was elevated, but was unable to quantitate whether or not it was euphoria. She has a past medical history significant for diabetes mellitus type 2, hypertension, migraines, dry eyes. She was transferred to our facility for evaluation and stabilization.  Principal Problem: Bipolar disorder ([place]) Diagnosis: Principal Problem:   Bipolar disorder ([place]) Active Problems:   Major depression, recurrent ([place])  Total Time spent with patient: 15 minutes  Past Psychiatric History: See admission H&P  Past Medical History:  Past Medical History:  Diagnosis Date  . Anxiety   . Depression   . Diabetes mellitus without complication ([place])   . Headache(784.0)   . Hypertension   . IBS (irritable bowel syndrome)   . Mental disorder     Past Surgical History:  Procedure Laterality Date  . NO PAST SURGERIES     Family History:  Family History  Problem Relation Age of Onset  . Depression Maternal Aunt   . Alcohol abuse Maternal Aunt   . Drug abuse Maternal Aunt   . Drug abuse Father   . Alcohol abuse Paternal Aunt   . Drug abuse Paternal Aunt   . Alcohol abuse Maternal Uncle   . Drug abuse Maternal Uncle   . Alcohol abuse Paternal Uncle   . Drug abuse Paternal Uncle    Family Psychiatric  History: See admission H&P Social History:  Social History   Substance and Sexual Activity  Alcohol Use Not Currently  . Alcohol/week: 0.0 standard drinks   Comment: socially     Social History   Substance and  Sexual Activity  Drug Use No    Social History   Socioeconomic History  . Marital status: Divorced    Spouse name: Not on file  . Number of children: 1  . Years of education: Not on file  . Highest education level: Not on file  Occupational History  . Occupation: Unemployed  Social Needs  . Financial resource strain: Not on file  . Food insecurity:    Worry: Not on file    Inability: Not on  file  . Transportation needs:    Medical: Not on file    Non-medical: Not on file  Tobacco Use  . Smoking status: Never Smoker  . Smokeless tobacco: Never Used  Substance and Sexual Activity  . Alcohol use: Not Currently    Alcohol/week: 0.0 standard drinks    Comment: socially  . Drug use: No  . Sexual activity: Not Currently  Lifestyle  . Physical activity:    Days per week: Not on file    Minutes per session: Not on file  . Stress: Not on file  Relationships  . Social connections:    Talks on phone: Not on file    Gets together: Not on file    Attends religious service: Not on file    Active member of club or organization: Not on file    Attends meetings of clubs or organizations: Not on file    Relationship status: Not on file  Other Topics Concern  . Not on file  Social History Narrative  . Not on file   Additional Social History:    History of alcohol / drug use?: No history of alcohol / drug abuse                    Sleep: Good  Appetite:  Good  Current Medications: Current Facility-Administered Medications  Medication Dose Route Frequency Provider Last Rate Last Dose  . alum & mag hydroxide-simeth (MAALOX/MYLANTA) 200-200-20 MG/5ML suspension 30 mL  30 mL Oral Q4H PRN Sharma Covert, MD   30 mL at 05/06/18 1716  . antiseptic oral rinse (BIOTENE) solution 15 mL  15 mL Mouth Rinse PRN Sharma Covert, MD   15 mL at 05/07/18 0850  . buPROPion (WELLBUTRIN XL) 24 hr tablet 150 mg  150 mg Oral Daily Sharma Covert, MD   150 mg at 05/07/18 0854  . cloNIDine (CATAPRES) tablet 0.1 mg  0.1 mg Oral TID PRN Sharma Covert, MD      . hydrOXYzine (ATARAX/VISTARIL) tablet 25 mg  25 mg Oral TID PRN Sharma Covert, MD   25 mg at 05/06/18 2108  . ibuprofen (ADVIL,MOTRIN) tablet 400 mg  400 mg Oral Q6H PRN Sharma Covert, MD   400 mg at 05/03/18 2106  . ketotifen (ZADITOR) 0.025 % ophthalmic solution 1 drop  1 drop Both Eyes BID Sharma Covert,  MD   1 drop at 05/07/18 0850  . magnesium hydroxide (MILK OF MAGNESIA) suspension 30 mL  30 mL Oral Daily PRN Sharma Covert, MD      . metFORMIN (GLUCOPHAGE) tablet 500 mg  500 mg Oral Q breakfast Sharma Covert, MD   500 mg at 05/07/18 0853  . [START ON 05/08/2018] OXcarbazepine (TRILEPTAL) tablet 150 mg  150 mg Oral Daily Connye Burkitt, NP      . OXcarbazepine (TRILEPTAL) tablet 450 mg  450 mg Oral QHS Connye Burkitt, NP   450 mg at 05/06/18 2108  .  traZODone (DESYREL) tablet 50 mg  50 mg Oral QHS PRN Sharma Covert, MD        Lab Results:  Results for orders placed or performed during the hospital encounter of 05/03/18 (from the past 48 hour(s))  Glucose, capillary     Status: Abnormal   Collection Time: 05/05/18  5:11 PM  Result Value Ref Range   Glucose-Capillary 129 (H) 70 - 99 mg/dL  Glucose, capillary     Status: Abnormal   Collection Time: 05/05/18  8:10 PM  Result Value Ref Range   Glucose-Capillary 194 (H) 70 - 99 mg/dL  Glucose, capillary     Status: Abnormal   Collection Time: 05/06/18  6:11 AM  Result Value Ref Range   Glucose-Capillary 130 (H) 70 - 99 mg/dL  Glucose, capillary     Status: Abnormal   Collection Time: 05/06/18 11:32 AM  Result Value Ref Range   Glucose-Capillary 135 (H) 70 - 99 mg/dL  Glucose, capillary     Status: Abnormal   Collection Time: 05/06/18  4:48 PM  Result Value Ref Range   Glucose-Capillary 126 (H) 70 - 99 mg/dL  Glucose, capillary     Status: Abnormal   Collection Time: 05/06/18  8:25 PM  Result Value Ref Range   Glucose-Capillary 179 (H) 70 - 99 mg/dL   Comment 1 Notify RN   Glucose, capillary     Status: Abnormal   Collection Time: 05/07/18  6:10 AM  Result Value Ref Range   Glucose-Capillary 109 (H) 70 - 99 mg/dL   Comment 1 Notify RN    Comment 2 Document in Chart     Blood Alcohol level:  Lab Results  Component Value Date   ETH <10 05/01/2018   ETH <11 81/44/8185    Metabolic Disorder Labs: Lab Results   Component Value Date   HGBA1C 7.6 (H) 05/02/2018   MPG 171.42 05/02/2018   No results found for: PROLACTIN No results found for: CHOL, TRIG, HDL, CHOLHDL, VLDL, LDLCALC  Physical Findings: AIMS: Facial and Oral Movements Muscles of Facial Expression: None, normal Lips and Perioral Area: None, normal Jaw: None, normal Tongue: None, normal,Extremity Movements Upper (arms, wrists, hands, fingers): None, normal Lower (legs, knees, ankles, toes): None, normal, Trunk Movements Neck, shoulders, hips: None, normal, Overall Severity Severity of abnormal movements (highest score from questions above): None, normal Incapacitation due to abnormal movements: None, normal Patient's awareness of abnormal movements (rate only patient's report): No Awareness, Dental Status Current problems with teeth and/or dentures?: No Does patient usually wear dentures?: No  CIWA:  CIWA-Ar Total: 1 COWS:  COWS Total Score: 2  Musculoskeletal: Strength & Muscle Tone: within normal limits Gait & Station: normal Patient leans: N/A  Psychiatric Specialty Exam: Physical Exam  Nursing note and vitals reviewed. Constitutional: She is oriented to person, place, and time. She appears well-developed and well-nourished.  Cardiovascular: Normal rate.  Respiratory: Effort normal.  Neurological: She is alert and oriented to person, place, and time.    Review of Systems  Constitutional: Negative.   Respiratory: Negative.   Cardiovascular: Negative.   Psychiatric/Behavioral: Positive for depression (improving). Negative for hallucinations, memory loss, substance abuse and suicidal ideas. The patient is not nervous/anxious and does not have insomnia.     Blood pressure 124/86, pulse 97, temperature (!) 97.4 F (36.3 C), temperature source Oral, resp. rate 18, height 5' (1.524 m), weight 108.4 kg, SpO2 100 %.Body mass index is 46.68 kg/m.  General Appearance: Well Groomed  Eye Contact:  Good  Speech:  Clear and  Coherent  Volume:  Normal  Mood:  Euthymic  Affect:  Congruent and Full Range  Thought Process:  Coherent  Orientation:  Full (Time, Place, and Person)  Thought Content:  WDL  Suicidal Thoughts:  No  Homicidal Thoughts:  No  Memory:  Immediate;   Good  Judgement:  Fair  Insight:  Fair  Psychomotor Activity:  Normal  Concentration:  Concentration: Good  Recall:  Good  Fund of Knowledge:  Fair  Language:  Good  Akathisia:  No  Handed:  Right  AIMS (if indicated):     Assets:  Communication Skills Desire for Improvement Leisure Time Social Support  ADL's:  Intact  Cognition:  WNL  Sleep:  Number of Hours: 6     Treatment Plan Summary: Daily contact with patient to assess and evaluate symptoms and progress in treatment and Medication management  Continue inpatient hospitalization.  Mood: Continue Trileptal 450 mg PO QHS; daytime Trileptal dose decreased to 150 mg PO QAM due to sedation Continue Wellbutrin XL 150 mg PO daily  Sleep: Continue trazodone 50 mg PO QHS PRN insomnia  Anxiety: Continue Vistaril 25 mg PO TID PRN anxiety  Other medical: Continue HCTZ 25 mg PO daily Continue clonidine 0.1 mg PO PRN SBP>160 Continue metformin 500 mg PO daily Continue Zaditor eye drops  Patient will participate in the therapeutic group milieu.  Discharge disposition in progress.  Connye Burkitt, NP 05/07/2018, 9:46 AM

## 2018-05-08 DIAGNOSIS — F314 Bipolar disorder, current episode depressed, severe, without psychotic features: Secondary | ICD-10-CM

## 2018-05-08 LAB — GLUCOSE, CAPILLARY
GLUCOSE-CAPILLARY: 73 mg/dL (ref 70–99)
Glucose-Capillary: 137 mg/dL — ABNORMAL HIGH (ref 70–99)
Glucose-Capillary: 106 mg/dL — ABNORMAL HIGH (ref 70–99)

## 2018-05-08 MED ORDER — OXCARBAZEPINE 300 MG PO TABS
300.0000 mg | ORAL_TABLET | Freq: Every day | ORAL | Status: DC
  Administered 2018-05-08: 300 mg via ORAL
  Filled 2018-05-08 (×2): qty 1

## 2018-05-08 NOTE — Progress Notes (Signed)
D:  Patient's self inventory sheet, patient sleeps good, no sleep medication.  Good appetite, low energy level, poor concentration.  Rated depression and anxiety 7, hopeless 8.  Denied withdrawals.  Denied SI.  Denied physical problems.  Physical pain, stomach, IBD, worst pain #6 in past 24 hours.  Goal is coping.  Plans to attend group.  A:  Medications administered per MD orders.  Emotional support and encouragement given patient. R:  Denied SI and HI, contracts for safety.  Denied A/V hallucinations.  Safety maintained with 15 minute checks.

## 2018-05-08 NOTE — Progress Notes (Signed)
CSW spoke with patient at bedside regarding discharge planning. Patient is expected to discharge tomorrow.  Patient reports she has some anxiety regarding discharge, refers to her suicide attempt saying "I didn't expect to still be alive." Reports she needs to figure out a long term plan, requested CSW print a form for her SSDI appeal. CSW to provide form to patient prior to discharge.  Patient reports she will return to her home and that her mother will be able to pick her up.  Stephanie Acre, LCSW-A Clinical Social Worker

## 2018-05-08 NOTE — Progress Notes (Signed)
DAR NOTE: Pt present with bright affect and pleasant  mood in the unit., took all her meds as scheduled.  Pt's safety ensured with 15 minute and environmental checks. Pt currently denies SI/HI and A/V hallucinations. Pt verbally agrees to seek staff if SI/HI or A/VH occurs and to consult with staff before acting on these thoughts. Will continue POC.

## 2018-05-08 NOTE — Tx Team (Signed)
Interdisciplinary Treatment and Diagnostic Plan Update  05/08/2018 Time of Session: 9:00am Patricia Rodriguez MRN: [id number]  Principal Diagnosis: Bipolar disorder [facility])  Secondary Diagnoses: Principal Problem:   Bipolar disorder ([place]) Active Problems:   Major depression, recurrent ([place])   Current Medications:  Current Facility-Administered Medications  Medication Dose Route Frequency Provider Last Rate Last Dose  . alum & mag hydroxide-simeth (MAALOX/MYLANTA) 200-200-20 MG/5ML suspension 30 mL  30 mL Oral Q4H PRN Sharma Covert, MD   30 mL at 05/06/18 1716  . antiseptic oral rinse (BIOTENE) solution 15 mL  15 mL Mouth Rinse PRN Sharma Covert, MD   15 mL at 05/07/18 2144  . buPROPion (WELLBUTRIN XL) 24 hr tablet 150 mg  150 mg Oral Daily Sharma Covert, MD   150 mg at 05/08/18 0820  . cloNIDine (CATAPRES) tablet 0.1 mg  0.1 mg Oral TID PRN Sharma Covert, MD      . hydrOXYzine (ATARAX/VISTARIL) tablet 25 mg  25 mg Oral TID PRN Sharma Covert, MD   25 mg at 05/06/18 2108  . ibuprofen (ADVIL,MOTRIN) tablet 400 mg  400 mg Oral Q6H PRN Sharma Covert, MD   400 mg at 05/03/18 2106  . ketotifen (ZADITOR) 0.025 % ophthalmic solution 1 drop  1 drop Both Eyes BID Sharma Covert, MD   1 drop at 05/08/18 0820  . magnesium hydroxide (MILK OF MAGNESIA) suspension 30 mL  30 mL Oral Daily PRN Sharma Covert, MD      . metFORMIN (GLUCOPHAGE) tablet 500 mg  500 mg Oral Q breakfast Sharma Covert, MD   500 mg at 05/08/18 7425  . OXcarbazepine (TRILEPTAL) tablet 150 mg  150 mg Oral Daily Connye Burkitt, NP   150 mg at 05/08/18 9563  . Oxcarbazepine (TRILEPTAL) tablet 300 mg  300 mg Oral QHS Sharma Covert, MD      . traZODone (DESYREL) tablet 50 mg  50 mg Oral QHS PRN Sharma Covert, MD       PTA Medications: Medications Prior to Admission  Medication Sig Dispense Refill Last Dose  . acetaminophen (TYLENOL) 325 MG tablet Take 2 tablets (650 mg total) by  mouth every 6 (six) hours as needed for moderate pain, fever or headache.     Marland Kitchen amLODipine (NORVASC) 10 MG tablet Take 1 tablet (10 mg total) by mouth daily.       Patient Stressors: Financial difficulties Health problems Medication change or noncompliance Occupational concerns  Patient Strengths: Ability for insight Average or above average intelligence Capable of independent living Communication skills General fund of knowledge  Treatment Modalities: Medication Management, Group therapy, Case management,  1 to 1 session with clinician, Psychoeducation, Recreational therapy.   Physician Treatment Plan for Primary Diagnosis: Bipolar disorder ([place]) Long Term Goal(s): Improvement in symptoms so as ready for discharge Improvement in symptoms so as ready for discharge   Short Term Goals: Ability to identify changes in lifestyle to reduce recurrence of condition will improve Ability to verbalize feelings will improve Ability to disclose and discuss suicidal ideas Ability to demonstrate self-control will improve Ability to identify and develop effective coping behaviors will improve Ability to maintain clinical measurements within normal limits will improve Compliance with prescribed medications will improve Ability to identify changes in lifestyle to reduce recurrence of condition will improve Ability to verbalize feelings will improve Ability to disclose and discuss suicidal ideas Ability to demonstrate self-control will improve Ability to identify and develop effective coping  behaviors will improve Ability to maintain clinical measurements within normal limits will improve Compliance with prescribed medications will improve  Medication Management: Evaluate patient's response, side effects, and tolerance of medication regimen.  Therapeutic Interventions: 1 to 1 sessions, Unit Group sessions and Medication administration.  Evaluation of Outcomes: Progressing  Physician  Treatment Plan for Secondary Diagnosis: Principal Problem:   Bipolar disorder ([place]) Active Problems:   Major depression, recurrent ([place])  Long Term Goal(s): Improvement in symptoms so as ready for discharge Improvement in symptoms so as ready for discharge   Short Term Goals: Ability to identify changes in lifestyle to reduce recurrence of condition will improve Ability to verbalize feelings will improve Ability to disclose and discuss suicidal ideas Ability to demonstrate self-control will improve Ability to identify and develop effective coping behaviors will improve Ability to maintain clinical measurements within normal limits will improve Compliance with prescribed medications will improve Ability to identify changes in lifestyle to reduce recurrence of condition will improve Ability to verbalize feelings will improve Ability to disclose and discuss suicidal ideas Ability to demonstrate self-control will improve Ability to identify and develop effective coping behaviors will improve Ability to maintain clinical measurements within normal limits will improve Compliance with prescribed medications will improve     Medication Management: Evaluate patient's response, side effects, and tolerance of medication regimen.  Therapeutic Interventions: 1 to 1 sessions, Unit Group sessions and Medication administration.  Evaluation of Outcomes: Progressing   RN Treatment Plan for Primary Diagnosis: Bipolar disorder ([place]) Long Term Goal(s): Knowledge of disease and therapeutic regimen to maintain health will improve  Short Term Goals: Ability to remain free from injury will improve, Ability to demonstrate self-control, Ability to participate in decision making will improve, Ability to verbalize feelings will improve, Ability to disclose and discuss suicidal ideas and Ability to identify and develop effective coping behaviors will improve  Medication Management: RN will administer  medications as ordered by provider, will assess and evaluate patient's response and provide education to patient for prescribed medication. RN will report any adverse and/or side effects to prescribing provider.  Therapeutic Interventions: 1 on 1 counseling sessions, Psychoeducation, Medication administration, Evaluate responses to treatment, Monitor vital signs and CBGs as ordered, Perform/monitor CIWA, COWS, AIMS and Fall Risk screenings as ordered, Perform wound care treatments as ordered.  Evaluation of Outcomes: Progressing   LCSW Treatment Plan for Primary Diagnosis: Bipolar disorder ([place]) Long Term Goal(s): Safe transition to appropriate next level of care at discharge, Engage patient in therapeutic group addressing interpersonal concerns.  Short Term Goals: Engage patient in aftercare planning with referrals and resources, Increase social support, Increase emotional regulation, Identify triggers associated with mental health/substance abuse issues and Increase skills for wellness and recovery  Therapeutic Interventions: Assess for all discharge needs, 1 to 1 time with Social worker, Explore available resources and support systems, Assess for adequacy in community support network, Educate family and significant other(s) on suicide prevention, Complete Psychosocial Assessment, Interpersonal group therapy.  Evaluation of Outcomes: Progressing   Progress in Treatment: Attending groups: Yes. Participating in groups: Yes. Taking medication as prescribed: Yes. Toleration medication: Yes. Wants meds altered to improve sleep Family/Significant other contact made: Yes, individual(s) contacted:  daughter Patient understands diagnosis: Yes. Discussing patient identified problems/goals with staff: Yes. Medical problems stabilized or resolved: Yes. Denies suicidal/homicidal ideation: Yes. Issues/concerns per patient self-inventory: Yes.   New problem(s) identified: No, Describe:  none  New  Short Term/Long Term Goal(s): medication management for mood stabilization; elimination of SI thoughts;  development of comprehensive mental wellness/sobriety plan.  Patient Goals: "I want to have a solid plan for living outside the hospital without consistent thoughts of suicide"   Discharge Plan or Barriers: Discharge home and follow up with [facility].  Reason for Continuation of Hospitalization: Anxiety Depression Medication stabilization  Estimated Length of Stay: discharge tomorrow, 05/09/2018  Attendees: Patient: 05/08/2018 8:48 AM  Physician: Queen Blossom 05/08/2018 8:48 AM  Nursing:  05/08/2018 8:48 AM  RN Care Manager: 05/08/2018 8:48 AM  Social Worker: Stephanie Acre, Latanya Presser 05/08/2018 8:48 AM  Recreational Therapist:  05/08/2018 8:48 AM  Other:  05/08/2018 8:48 AM  Other:  05/08/2018 8:48 AM  Other: 05/08/2018 8:48 AM    Scribe for Treatment Team: Joellen [place], [place] 05/08/2018 8:48 AM

## 2018-05-08 NOTE — [facility] Group Notes (Signed)
[place] Group Notes:  (Nursing/MHT/Case Management/Adjunct)  Date:  05/08/2018  Time:  4:00 pm  Type of Therapy:  Psychoeducational Skills  Participation Level:  Did Not Attend  Participation Quality:    Affect:    Cognitive:    Insight:    Engagement in Group:    Modes of Intervention:    Summary of Progress/Problems:  Cammy Copa 05/08/2018, 6:50 PM

## 2018-05-08 NOTE — Progress Notes (Signed)
Nursing Progress Note: 7p-7a D: Pt currently presents with a anxious/pleasant affect and behavior. Interacting appropriately with the milieu. Pt reports good sleep during the previous night with current medication regimen. Pt did attend wrap-up group.  A: Pt provided with medications per providers orders. Pt's labs and vitals were monitored throughout the night. Pt supported emotionally and encouraged to express concerns and questions. Pt educated on medications.  R: Pt's safety ensured with 15 minute and environmental checks. Pt currently denies SI, HI, and AVH. Pt verbally contracts to seek staff if SI,HI, or AVH occurs and to consult with staff before acting on any harmful thoughts. Will continue to monitor.  

## 2018-05-08 NOTE — Plan of Care (Signed)
Nurse discussed anxiety, depression, coping skills with patient. 

## 2018-05-08 NOTE — Progress Notes (Signed)
Patient did not attend wrap up group. 

## 2018-05-08 NOTE — [facility] Group Notes (Signed)
LCSW Group Therapy Note 05/08/2018 2:49 PM  Type of Therapy and Topic: Group Therapy: Overcoming Obstacles  Participation Level: Active  Description of Group:  In this group patients will be encouraged to explore what they see as obstacles to their own wellness and recovery. They will be guided to discuss their thoughts, feelings, and behaviors related to these obstacles. The group will process together ways to cope with barriers, with attention given to specific choices patients can make. Each patient will be challenged to identify changes they are motivated to make in order to overcome their obstacles. This group will be process-oriented, with patients participating in exploration of their own experiences as well as giving and receiving support and challenge from other group members.  Therapeutic Goals: 1. Patient will identify personal and current obstacles as they relate to admission. 2. Patient will identify barriers that currently interfere with their wellness or overcoming obstacles.  3. Patient will identify feelings, thought process and behaviors related to these barriers. 4. Patient will identify two changes they are willing to make to overcome these obstacles:   Summary of Patient Progress  Kenya was engaged and participated throughout the group session. Makaya reports that her main obstacle is being fearful of discharging from the hospital. Joleen states that she does not have a concrete plan for how she will "get her old life back", however she knows that she has to start building a plan.     Therapeutic Modalities:  Cognitive Behavioral Therapy Solution Focused Therapy Motivational Interviewing Relapse Prevention Therapy   Theresa Duty Clinical Social Worker

## 2018-05-08 NOTE — Progress Notes (Signed)
[facility] MD Progress Note  05/08/2018 1:15 PM LYSBETH DICOLA  MRN:  [id number] Subjective: Patient reports some improvement compared to admission although still describes symptoms of anxiety and mild to moderate depression.  Denies suicidal ideations at this time and is future oriented.  States "I do not want to die, I want to live".  Also states she plans to reapply for disability.  Currently does not endorse medication side effects.   Objective: I have discussed case with treatment team and have met with patient.  53 year old female, lives alone, presented to the ED on 1/13 following a suicide attempt by overdosing on Benadryl.  Patient reports worsening depression and suicidal ideations where partly due to not getting disability that she had applied for. Today reports some improvement, acknowledges she is feeling better than she did on admission, denies current suicidal ideations and states she has made the decision to "live", and currently presents future oriented, stating that she plans to reapply for disability.  She is visible in dayroom, pleasant/cooperative on approach.  No disruptive or agitated behaviors.  At this time she is on Wellbutrin, Trileptal, Trazodone PRN for insomnia.  Does not endorse medication side effects.  Of note, denies any history of seizure disorder, head trauma, eating disorder. Labs reviewed-CBG today 106.  TSH is mildly elevated at 5.75.  Patient denies prior history of thyroid disease.  Principal Problem: Bipolar disorder ([place]) Diagnosis: Principal Problem:   Bipolar disorder ([place]) Active Problems:   Major depression, recurrent ([place])  Total Time spent with patient: 20 minutes  Past Psychiatric History: See admission H&P  Past Medical History:  Past Medical History:  Diagnosis Date  . Anxiety   . Depression   . Diabetes mellitus without complication ([place])   . Headache(784.0)   . Hypertension   . IBS (irritable bowel syndrome)   . Mental disorder      Past Surgical History:  Procedure Laterality Date  . NO PAST SURGERIES     Family History:  Family History  Problem Relation Age of Onset  . Depression Maternal Aunt   . Alcohol abuse Maternal Aunt   . Drug abuse Maternal Aunt   . Drug abuse Father   . Alcohol abuse Paternal Aunt   . Drug abuse Paternal Aunt   . Alcohol abuse Maternal Uncle   . Drug abuse Maternal Uncle   . Alcohol abuse Paternal Uncle   . Drug abuse Paternal Uncle    Family Psychiatric  History: See admission H&P Social History:  Social History   Substance and Sexual Activity  Alcohol Use Not Currently  . Alcohol/week: 0.0 standard drinks   Comment: socially     Social History   Substance and Sexual Activity  Drug Use No    Social History   Socioeconomic History  . Marital status: Divorced    Spouse name: Not on file  . Number of children: 1  . Years of education: Not on file  . Highest education level: Not on file  Occupational History  . Occupation: Unemployed  Social Needs  . Financial resource strain: Not on file  . Food insecurity:    Worry: Not on file    Inability: Not on file  . Transportation needs:    Medical: Not on file    Non-medical: Not on file  Tobacco Use  . Smoking status: Never Smoker  . Smokeless tobacco: Never Used  Substance and Sexual Activity  . Alcohol use: Not Currently    Alcohol/week: 0.0 standard drinks  Comment: socially  . Drug use: No  . Sexual activity: Not Currently  Lifestyle  . Physical activity:    Days per week: Not on file    Minutes per session: Not on file  . Stress: Not on file  Relationships  . Social connections:    Talks on phone: Not on file    Gets together: Not on file    Attends religious service: Not on file    Active member of club or organization: Not on file    Attends meetings of clubs or organizations: Not on file    Relationship status: Not on file  Other Topics Concern  . Not on file  Social History Narrative  .  Not on file   Additional Social History:    History of alcohol / drug use?: No history of alcohol / drug abuse  Sleep: Fair  Appetite:  Good  Current Medications: Current Facility-Administered Medications  Medication Dose Route Frequency Provider Last Rate Last Dose  . alum & mag hydroxide-simeth (MAALOX/MYLANTA) 200-200-20 MG/5ML suspension 30 mL  30 mL Oral Q4H PRN Sharma Covert, MD   30 mL at 05/06/18 1716  . antiseptic oral rinse (BIOTENE) solution 15 mL  15 mL Mouth Rinse PRN Sharma Covert, MD   15 mL at 05/07/18 2144  . buPROPion (WELLBUTRIN XL) 24 hr tablet 150 mg  150 mg Oral Daily Sharma Covert, MD   150 mg at 05/08/18 0820  . cloNIDine (CATAPRES) tablet 0.1 mg  0.1 mg Oral TID PRN Sharma Covert, MD      . hydrOXYzine (ATARAX/VISTARIL) tablet 25 mg  25 mg Oral TID PRN Sharma Covert, MD   25 mg at 05/06/18 2108  . ibuprofen (ADVIL,MOTRIN) tablet 400 mg  400 mg Oral Q6H PRN Sharma Covert, MD   400 mg at 05/03/18 2106  . ketotifen (ZADITOR) 0.025 % ophthalmic solution 1 drop  1 drop Both Eyes BID Sharma Covert, MD   1 drop at 05/08/18 0820  . magnesium hydroxide (MILK OF MAGNESIA) suspension 30 mL  30 mL Oral Daily PRN Sharma Covert, MD      . metFORMIN (GLUCOPHAGE) tablet 500 mg  500 mg Oral Q breakfast Sharma Covert, MD   500 mg at 05/08/18 0867  . OXcarbazepine (TRILEPTAL) tablet 150 mg  150 mg Oral Daily Connye Burkitt, NP   150 mg at 05/08/18 6195  . Oxcarbazepine (TRILEPTAL) tablet 300 mg  300 mg Oral QHS Sharma Covert, MD      . traZODone (DESYREL) tablet 50 mg  50 mg Oral QHS PRN Sharma Covert, MD        Lab Results:  Results for orders placed or performed during the hospital encounter of 05/03/18 (from the past 48 hour(s))  Glucose, capillary     Status: Abnormal   Collection Time: 05/06/18  4:48 PM  Result Value Ref Range   Glucose-Capillary 126 (H) 70 - 99 mg/dL  Glucose, capillary     Status: Abnormal    Collection Time: 05/06/18  8:25 PM  Result Value Ref Range   Glucose-Capillary 179 (H) 70 - 99 mg/dL   Comment 1 Notify RN   Glucose, capillary     Status: Abnormal   Collection Time: 05/07/18  6:10 AM  Result Value Ref Range   Glucose-Capillary 109 (H) 70 - 99 mg/dL   Comment 1 Notify RN    Comment 2 Document in Chart   Glucose,  capillary     Status: Abnormal   Collection Time: 05/07/18 11:57 AM  Result Value Ref Range   Glucose-Capillary 133 (H) 70 - 99 mg/dL  Glucose, capillary     Status: None   Collection Time: 05/07/18  4:57 PM  Result Value Ref Range   Glucose-Capillary 99 70 - 99 mg/dL  Glucose, capillary     Status: Abnormal   Collection Time: 05/08/18  5:55 AM  Result Value Ref Range   Glucose-Capillary 137 (H) 70 - 99 mg/dL  Glucose, capillary     Status: Abnormal   Collection Time: 05/08/18 11:47 AM  Result Value Ref Range   Glucose-Capillary 106 (H) 70 - 99 mg/dL   Comment 1 Notify RN    Comment 2 Document in Chart     Blood Alcohol level:  Lab Results  Component Value Date   ETH <10 05/01/2018   ETH <11 14/97/0263    Metabolic Disorder Labs: Lab Results  Component Value Date   HGBA1C 7.6 (H) 05/02/2018   MPG 171.42 05/02/2018   No results found for: PROLACTIN No results found for: CHOL, TRIG, HDL, CHOLHDL, VLDL, LDLCALC  Physical Findings: AIMS: Facial and Oral Movements Muscles of Facial Expression: None, normal Lips and Perioral Area: None, normal Jaw: None, normal Tongue: None, normal,Extremity Movements Upper (arms, wrists, hands, fingers): None, normal Lower (legs, knees, ankles, toes): None, normal, Trunk Movements Neck, shoulders, hips: None, normal, Overall Severity Severity of abnormal movements (highest score from questions above): None, normal Incapacitation due to abnormal movements: None, normal Patient's awareness of abnormal movements (rate only patient's report): No Awareness, Dental Status Current problems with teeth and/or  dentures?: No Does patient usually wear dentures?: No  CIWA:  CIWA-Ar Total: 1 COWS:  COWS Total Score: 2  Musculoskeletal: Strength & Muscle Tone: within normal limits Gait & Station: normal Patient leans: N/A  Psychiatric Specialty Exam: Physical Exam  Nursing note and vitals reviewed. Constitutional: She is oriented to person, place, and time. She appears well-developed and well-nourished.  Cardiovascular: Normal rate.  Respiratory: Effort normal.  Neurological: She is alert and oriented to person, place, and time.    Review of Systems  Constitutional: Negative.   Respiratory: Negative.   Cardiovascular: Negative.   Psychiatric/Behavioral: Positive for depression (improving). Negative for hallucinations, memory loss, substance abuse and suicidal ideas. The patient is not nervous/anxious and does not have insomnia.   Denies chest pain, no shortness of breath, no vomiting  Blood pressure 124/86, pulse 97, temperature 97.8 F (36.6 C), temperature source Oral, resp. rate 18, height 5' (1.524 m), weight 108.4 kg, SpO2 100 %.Body mass index is 46.68 kg/m.  General Appearance: Improving grooming  Eye Contact:  Good  Speech:  Normal Rate  Volume:  Normal  Mood:  Describes feeling better than she did on admission, reports some lingering depression  Affect:  Appropriate and Full Range  Thought Process:  Linear and Descriptions of Associations: Intact  Orientation:  Other:  Fully alert and attentive  Thought Content:  At this time denies hallucinations, does not appear internally preoccupied, no delusions are expressed  Suicidal Thoughts:  No-denies current suicidal ideations and contracts for safety  Homicidal Thoughts:  No-denies homicidal or violent ideations  Memory:  Recent and remote grossly intact  Judgement:  Fair-improving  Insight:  Fair-improving  Psychomotor Activity:  Normal-no current restlessness or agitation  Concentration:  Concentration: Good and Attention Span:  Good  Recall:  Good  Fund of Knowledge:  Good  Language:  Good  Akathisia:  No  Handed:  Right  AIMS (if indicated):     Assets:  Communication Skills Desire for Improvement Leisure Time Social Support  ADL's:  Intact  Cognition:  WNL  Sleep:  Number of Hours: 4.75   Assessment -  53 year old female, lives alone, presented to the ED on 1/13 following a suicide attempt by overdosing on Benadryl.  Patient reports worsening depression and suicidal ideations where partly due to not getting disability that she had applied for.  Patient reports partial improvement compared to admission, currently denies suicidal ideations and at this time presents future oriented.  Thus far tolerating medications well.  As she improves she is focusing more on disposition planning and hopeful for discharge soon.  Of note TSH is mildly elevated: She denies any known history of thyroid disease.   Treatment Plan Summary: Daily contact with patient to assess and evaluate symptoms and progress in treatment and Medication management Treatment plan reviewed as below today 1/20 Continue to encourage group and milieu participation to work on coping skills and symptom reduction Continue Trileptal 150 mgr QAM and 300  mg PO QHS for mood disorder Continue Wellbutrin XL 150 mg QDAY for depression Continue Trazodone 50 mg  QHS PRN insomnia Continue Vistaril 25 mg TID PRN anxiety Continue Metformin for DM management Check T3, T4 to follow up on elevated TSH Treatment team working on disposition planning options  Jenne Campus, MD 05/08/2018, 1:15 PM   Patient ID: [name], female   DOB: Nov 17, 1965, 53 y.o.   MRN: [id number]

## 2018-05-09 LAB — GLUCOSE, CAPILLARY
Glucose-Capillary: 127 mg/dL — ABNORMAL HIGH (ref 70–99)
Glucose-Capillary: 114 mg/dL — ABNORMAL HIGH (ref 70–99)
Glucose-Capillary: 108 mg/dL — ABNORMAL HIGH (ref 70–99)

## 2018-05-09 LAB — T4, FREE: Free T4: 0.9 ng/dL (ref 0.82–1.77)

## 2018-05-09 MED ORDER — ARIPIPRAZOLE 5 MG PO TABS
5.0000 mg | ORAL_TABLET | Freq: Every day | ORAL | Status: DC
  Administered 2018-05-09 – 2018-05-10 (×2): 5 mg via ORAL
  Filled 2018-05-09 (×4): qty 1

## 2018-05-09 MED ORDER — ARIPIPRAZOLE 2 MG PO TABS: 2.0000 mg | ORAL_TABLET | Freq: Every day | ORAL | Status: DC

## 2018-05-09 MED ORDER — SERTRALINE HCL 50 MG PO TABS
50.0000 mg | ORAL_TABLET | Freq: Every day | ORAL | Status: DC
  Administered 2018-05-10 – 2018-05-11 (×2): 50 mg via ORAL
  Filled 2018-05-09 (×3): qty 1
  Filled 2018-05-09: qty 7
  Filled 2018-05-09: qty 1

## 2018-05-09 NOTE — Progress Notes (Signed)
Patient ID: [name], female   DOB: [birth year], 53 y.o.   MRN: [id number]  Nursing Progress Note [id number]  Data: On initial approach, patient is seen up in the milieu on the telephone. Patient presents pleasant and cooperative. Patient compliant with scheduled medications. Patient reports a headache this morning and is medicated with PRNs. Patient completed self-inventory sheet and rates depression, hopelessness, and anxiety 7,9,6 respectively. Patient rates their sleep and appetite as good/good respectively. Patient states goal for today is to "work on my life plan". Patient is seen attending groups and visible in the milieu. Patient currently denies SI/HI/AVH.   Action: Patient is educated about and provided medication per provider's orders. Patient safety maintained with q15 min safety checks and frequent rounding. Low fall risk precautions in place. Emotional support given. 1:1 interaction and active listening provided. Patient encouraged to attend meals, groups, and work on treatment plan and goals. Labs, vital signs and patient behavior monitored throughout shift.   Response: Patient remains safe on the unit at this time and agrees to come to staff with any issues/concerns. Patient is interacting with peers appropriately on the unit. Will continue to support and monitor.

## 2018-05-09 NOTE — Progress Notes (Signed)
Adult Psychoeducational Group Note  Date:  05/09/2018 Time:  9:19 PM  Group Topic/Focus:  Wrap-Up Group:   The focus of this group is to help patients review their daily goal of treatment and discuss progress on daily workbooks.  Participation Level:  Active  Participation Quality:  Appropriate  Affect:  Appropriate  Cognitive:  Appropriate  Insight: Appropriate  Engagement in Group:  Engaged  Modes of Intervention:  Discussion  Additional Comments:  Patient attended group and participated.   Alette Kataoka W Haylen Bellotti 3/66/8159, 9:19 PM

## 2018-05-09 NOTE — Progress Notes (Signed)
[facility] MD Progress Note  05/09/2018 10:15 AM TYARRA NOLTON  MRN:  [id number] Subjective: Patient describes some improvement but overall states she feels anxious and depressed still.  Denies suicidal plans/intentions but states she continues to feel as if she would rather be dead sometimes, and that reason to live is "because other people want me to be alive". Describes some increased anxiety as she approaches disposition planning/potential discharge.  Denies medication side effects, but states she feels that medications are "not helping that much".  Objective: I have discussed case with treatment team and have met with patient.  53 year old female, lives alone, presented to the ED on 1/13 following a suicide attempt by overdosing on Benadryl.  Patient reports worsening depression and suicidal ideations where partly due to not getting disability that she had applied for. Patient presents vaguely depressed, anxious with a sad affect although she does smile briefly at times.  Denies suicidal plan/intention but as above continues to states she sometimes feels she would be better off dead.  We discussed medication management options-bupropion may be associated with increased risk of high blood pressure and increased anxiety.  At this time prefers to try an SSRI, which she has not been on before.  She states that [place] was helpful in the past, felt better on it but had difficulty affording it.  She expresses interest in Abilify for mood disorder and antidepressant augmentation.  No disruptive or agitated behaviors on unit, pleasant on approach.  Labs reviewed-TSH was mildly elevated at 5.75, T4 within normal at 0.9. Principal Problem: Bipolar disorder ([place]) Diagnosis: Principal Problem:   Bipolar disorder ([place]) Active Problems:   Major depression, recurrent ([place])  Total Time spent with patient: 20 minutes  Past Psychiatric History: See admission H&P  Past Medical History:  Past Medical  History:  Diagnosis Date  . Anxiety   . Depression   . Diabetes mellitus without complication ([place])   . Headache(784.0)   . Hypertension   . IBS (irritable bowel syndrome)   . Mental disorder     Past Surgical History:  Procedure Laterality Date  . NO PAST SURGERIES     Family History:  Family History  Problem Relation Age of Onset  . Depression Maternal Aunt   . Alcohol abuse Maternal Aunt   . Drug abuse Maternal Aunt   . Drug abuse Father   . Alcohol abuse Paternal Aunt   . Drug abuse Paternal Aunt   . Alcohol abuse Maternal Uncle   . Drug abuse Maternal Uncle   . Alcohol abuse Paternal Uncle   . Drug abuse Paternal Uncle    Family Psychiatric  History: See admission H&P Social History:  Social History   Substance and Sexual Activity  Alcohol Use Not Currently  . Alcohol/week: 0.0 standard drinks   Comment: socially     Social History   Substance and Sexual Activity  Drug Use No    Social History   Socioeconomic History  . Marital status: Divorced    Spouse name: Not on file  . Number of children: 1  . Years of education: Not on file  . Highest education level: Not on file  Occupational History  . Occupation: Unemployed  Social Needs  . Financial resource strain: Not on file  . Food insecurity:    Worry: Not on file    Inability: Not on file  . Transportation needs:    Medical: Not on file    Non-medical: Not on file  Tobacco Use  .  Smoking status: Never Smoker  . Smokeless tobacco: Never Used  Substance and Sexual Activity  . Alcohol use: Not Currently    Alcohol/week: 0.0 standard drinks    Comment: socially  . Drug use: No  . Sexual activity: Not Currently  Lifestyle  . Physical activity:    Days per week: Not on file    Minutes per session: Not on file  . Stress: Not on file  Relationships  . Social connections:    Talks on phone: Not on file    Gets together: Not on file    Attends religious service: Not on file    Active member of  club or organization: Not on file    Attends meetings of clubs or organizations: Not on file    Relationship status: Not on file  Other Topics Concern  . Not on file  Social History Narrative  . Not on file   Additional Social History:    History of alcohol / drug use?: No history of alcohol / drug abuse  Sleep: Fair  Appetite:  Good  Current Medications: Current Facility-Administered Medications  Medication Dose Route Frequency Provider Last Rate Last Dose  . alum & mag hydroxide-simeth (MAALOX/MYLANTA) 200-200-20 MG/5ML suspension 30 mL  30 mL Oral Q4H PRN Sharma Covert, MD   30 mL at 05/06/18 1716  . antiseptic oral rinse (BIOTENE) solution 15 mL  15 mL Mouth Rinse PRN Sharma Covert, MD   15 mL at 05/08/18 2314  . ARIPiprazole (ABILIFY) tablet 5 mg  5 mg Oral Daily Eugenie Harewood A, MD      . cloNIDine (CATAPRES) tablet 0.1 mg  0.1 mg Oral TID PRN Sharma Covert, MD   0.1 mg at 05/09/18 0836  . hydrOXYzine (ATARAX/VISTARIL) tablet 25 mg  25 mg Oral TID PRN Sharma Covert, MD   25 mg at 05/06/18 2108  . ibuprofen (ADVIL,MOTRIN) tablet 400 mg  400 mg Oral Q6H PRN Sharma Covert, MD   400 mg at 05/09/18 0836  . ketotifen (ZADITOR) 0.025 % ophthalmic solution 1 drop  1 drop Both Eyes BID Sharma Covert, MD   1 drop at 05/09/18 [phone]  . magnesium hydroxide (MILK OF MAGNESIA) suspension 30 mL  30 mL Oral Daily PRN Sharma Covert, MD      . metFORMIN (GLUCOPHAGE) tablet 500 mg  500 mg Oral Q breakfast Sharma Covert, MD   500 mg at 05/09/18 3295  . sertraline (ZOLOFT) tablet 50 mg  50 mg Oral Daily Endy Easterly A, MD      . traZODone (DESYREL) tablet 50 mg  50 mg Oral QHS PRN Sharma Covert, MD        Lab Results:  Results for orders placed or performed during the hospital encounter of 05/03/18 (from the past 48 hour(s))  Glucose, capillary     Status: Abnormal   Collection Time: 05/07/18 11:57 AM  Result Value Ref Range   Glucose-Capillary  133 (H) 70 - 99 mg/dL  Glucose, capillary     Status: None   Collection Time: 05/07/18  4:57 PM  Result Value Ref Range   Glucose-Capillary 99 70 - 99 mg/dL  Glucose, capillary     Status: Abnormal   Collection Time: 05/08/18  5:55 AM  Result Value Ref Range   Glucose-Capillary 137 (H) 70 - 99 mg/dL  Glucose, capillary     Status: Abnormal   Collection Time: 05/08/18 11:47 AM  Result Value Ref Range  Glucose-Capillary 106 (H) 70 - 99 mg/dL   Comment 1 Notify RN    Comment 2 Document in Chart   Glucose, capillary     Status: None   Collection Time: 05/08/18  5:01 PM  Result Value Ref Range   Glucose-Capillary 73 70 - 99 mg/dL   Comment 1 Notify RN    Comment 2 Document in Chart   T4, free     Status: None   Collection Time: 05/08/18  6:24 PM  Result Value Ref Range   Free T4 0.90 0.82 - 1.77 ng/dL    Comment: (NOTE) Biotin ingestion may interfere with free T4 tests. If the results are inconsistent with the TSH level, previous test results, or the clinical presentation, then consider biotin interference. If needed, order repeat testing after stopping biotin. Performed at [facility], [place] [address] [place], [place] [address]   Glucose, capillary     Status: Abnormal   Collection Time: 05/09/18  5:39 AM  Result Value Ref Range   Glucose-Capillary 127 (H) 70 - 99 mg/dL   Comment 1 Notify RN     Blood Alcohol level:  Lab Results  Component Value Date   ETH <10 05/01/2018   ETH <11 50/35/4656    Metabolic Disorder Labs: Lab Results  Component Value Date   HGBA1C 7.6 (H) 05/02/2018   MPG 171.42 05/02/2018   No results found for: PROLACTIN No results found for: CHOL, TRIG, HDL, CHOLHDL, VLDL, LDLCALC  Physical Findings: AIMS: Facial and Oral Movements Muscles of Facial Expression: None, normal Lips and Perioral Area: None, normal Jaw: None, normal Tongue: None, normal,Extremity Movements Upper (arms, wrists, hands, fingers): None, normal Lower (legs,  knees, ankles, toes): None, normal, Trunk Movements Neck, shoulders, hips: None, normal, Overall Severity Severity of abnormal movements (highest score from questions above): None, normal Incapacitation due to abnormal movements: None, normal Patient's awareness of abnormal movements (rate only patient's report): No Awareness, Dental Status Current problems with teeth and/or dentures?: No Does patient usually wear dentures?: No  CIWA:  CIWA-Ar Total: 1 COWS:  COWS Total Score: 2  Musculoskeletal: Strength & Muscle Tone: within normal limits Gait & Station: normal Patient leans: N/A  Psychiatric Specialty Exam: Physical Exam  Nursing note and vitals reviewed. Constitutional: She is oriented to person, place, and time. She appears well-developed and well-nourished.  Cardiovascular: Normal rate.  Respiratory: Effort normal.  Neurological: She is alert and oriented to person, place, and time.    Review of Systems  Constitutional: Negative.   Respiratory: Negative.   Cardiovascular: Negative.   Psychiatric/Behavioral: Positive for depression (improving). Negative for hallucinations, memory loss, substance abuse and suicidal ideas. The patient is not nervous/anxious and does not have insomnia.   Denies chest pain, no shortness of breath, no vomiting  Blood pressure (!) 146/100, pulse 99, temperature 97.6 F (36.4 C), temperature source Oral, resp. rate 18, height 5' (1.524 m), weight 108.4 kg, SpO2 100 %.Body mass index is 46.68 kg/m.  General Appearance: Improving grooming  Eye Contact:  Good  Speech:  Normal Rate  Volume:  Normal  Mood:  Partially improved mood but remains depressed and vaguely anxious  Affect:  Constricted, does smile at times appropriately  Thought Process:  Linear and Descriptions of Associations: Intact  Orientation:  Other:  Fully alert and attentive  Thought Content:  At this time denies hallucinations, does not appear internally preoccupied, no delusions  are expressed  Suicidal Thoughts:  Yes.  without intent/plan-denies current suicidal plans  or intentions and contracts for safety  Homicidal Thoughts:  No-denies homicidal or violent ideations  Memory:  Recent and remote grossly intact  Judgement:  Fair-improving  Insight:  Fair-improving  Psychomotor Activity:  Normal-no current restlessness or agitation  Concentration:  Concentration: Good and Attention Span: Good  Recall:  Good  Fund of Knowledge:  Good  Language:  Good  Akathisia:  No  Handed:  Right  AIMS (if indicated):     Assets:  Communication Skills Desire for Improvement Leisure Time Social Support  ADL's:  Intact  Cognition:  WNL  Sleep:  Number of Hours: 4.5   Assessment -  53 year old female, lives alone, presented to the ED on 1/13 following a suicide attempt by overdosing on Benadryl.  Patient reports worsening depression and suicidal ideations where partly due to not getting disability that she had applied for.  At this time patient reports persistent depression and anxiety and describes increased symptoms as she approaches potential discharge.  We reviewed medication regimen and she prefers to switch to an SSRI and Abilify, concerned that Wellbutrin may be associated with increased blood pressure and anxiety symptoms. Contracts for safety on unit but endorses intermittent passive SI.   Treatment Plan Summary: Daily contact with patient to assess and evaluate symptoms and progress in treatment and Medication management Treatment plan reviewed as below today 1/21 Continue to encourage group and milieu participation to work on coping skills and symptom reduction Discontinue Trileptal Continue Wellbutrin Start Zoloft 50 mg daily for depression Start Abilify 5 mg daily for mood disorder/augmentation Continue Trazodone 50 mg  QHS PRN insomnia Continue Vistaril 25 mg TID PRN anxiety Continue Metformin for DM management Treatment team working on disposition planning  options  Jenne Campus, MD 05/09/2018, 10:15 AM   Patient ID: [name], female   DOB: 03-15-1966, 53 y.o.   MRN: [id number]

## 2018-05-09 NOTE — Plan of Care (Signed)
  Problem: Education: Goal: Knowledge of [facility] Education information/materials will improve Outcome: Progressing   Problem: Activity: Goal: Interest or engagement in activities will improve Outcome: Progressing   Problem: Coping: Goal: Ability to demonstrate self-control will improve Outcome: Progressing   Problem: Health Behavior/Discharge Planning: Goal: Compliance with treatment plan for underlying cause of condition will improve Outcome: Progressing   Problem: Physical Regulation: Goal: Ability to maintain clinical measurements within normal limits will improve Outcome: Progressing   Problem: Safety: Goal: Periods of time without injury will increase Outcome: Progressing   Problem: Medication: Goal: Compliance with prescribed medication regimen will improve Outcome: Progressing   Problem: Self-Concept: Goal: Level of anxiety will decrease Outcome: Progressing   Problem: Education: Goal: Knowledge of the prescribed therapeutic regimen will improve Outcome: Progressing   Problem: Activity: Goal: Imbalance in normal sleep/wake cycle will improve Outcome: Progressing

## 2018-05-09 NOTE — [facility] Group Notes (Signed)
LCSW Group Therapy Note   05/09/2018  2:22 PM   Type of Therapy and Topic:  Group Therapy- Anger: Consequences and Cues   Participation Level:  Active- left early     Description of Group:   In this group, patients defined and differentiated anger from other emotions and identified when and how anger becomes a problem. Patient's identified triggering events and their personal anger cues, secondary emotions to anger, and identified healthy coping mechanisms.    Therapeutic Goals: 1. Patients will identify and discuss consequences of anger and outbursts. 2. Patients will identify and discuss triggering events for anger. 3. Patients will explore other emotions that tend to fuel their secondary emotion of anger. 4. Patients will recognize their physical, behavioral, emotional, and cognitive anger cues. 5. Patients will identify coping skills for anger triggering emotions or events.    Summary of Patient Progress:   Patricia Rodriguez actively participated in the group discussion and responded well with others. Patient identified confrontation as a trigger for her anger and coping as a physical anger cue. Patient left group before the conclusion and did not return.   Therapeutic Modalities:   Cognitive Behavioral Therapy Motivation Interviewing Solution Focused Therapy

## 2018-05-10 LAB — GLUCOSE, CAPILLARY
Glucose-Capillary: 117 mg/dL — ABNORMAL HIGH (ref 70–99)
Glucose-Capillary: 103 mg/dL — ABNORMAL HIGH (ref 70–99)
Glucose-Capillary: 161 mg/dL — ABNORMAL HIGH (ref 70–99)
Glucose-Capillary: 125 mg/dL — ABNORMAL HIGH (ref 70–99)

## 2018-05-10 LAB — METHYLMALONIC ACID, SERUM: Methylmalonic Acid, Quantitative: 159 nmol/L (ref 0–378)

## 2018-05-10 LAB — T3, FREE: T3 FREE: 2.6 pg/mL (ref 2.0–4.4)

## 2018-05-10 MED ORDER — AMLODIPINE BESYLATE 10 MG PO TABS
10.0000 mg | ORAL_TABLET | Freq: Every day | ORAL | Status: DC
  Administered 2018-05-10 – 2018-05-11 (×2): 10 mg via ORAL
  Filled 2018-05-10 (×3): qty 1
  Filled 2018-05-10: qty 7
  Filled 2018-05-10: qty 1

## 2018-05-10 MED ORDER — ARIPIPRAZOLE 2 MG PO TABS
2.0000 mg | ORAL_TABLET | Freq: Every day | ORAL | Status: DC
  Administered 2018-05-11: 2 mg via ORAL
  Filled 2018-05-10 (×3): qty 1
  Filled 2018-05-10: qty 7

## 2018-05-10 NOTE — Progress Notes (Signed)
[facility] MD Progress Note  05/10/2018 4:25 PM Patricia Rodriguez  MRN:  [id number] Subjective: Patient reports partial improvement since admission, denies suicidal ideations.  Reports she was feeling vaguely dizzy this a.m. but not at this time.    Objective: I have discussed case with treatment team and have met with patient.  53 year old female, lives alone, presented to the ED on 1/13 following a suicide attempt by overdosing on Benadryl.  Patient reports worsening depression and suicidal ideations where partly due to not getting disability that she had applied for. Reports partially improved mood compared to how she felt at admission.  Affect is vaguely constricted but reactive and tends to improve during session, smiles at times appropriately.  She is currently on Zoloft and Abilify.  As noted reports she felt vaguely dizzy this morning but not at this time.  Gait is steady at present.  Blood pressure has trended high and repeat BP is 160/104.  No associated symptoms.  Patient states she has been told her blood pressure was elevated in the past but states it has not been treated with medications before.  Chart review indicates she had recently started on Norvasc, which she is not currently on.  Does not remember having had side effects.  I have reviewed with hospitalist consultant who recommends restarting Norvasc at 10 mg daily. No disruptive or agitated behaviors on unit. We have reviewed psychiatric history further, endorses prior diagnoses of bipolar disorder but describes mainly episodes of depression.  States she has had brief episodes of subjectively increased energy and mood, usually lasting only a few hours.  Does not endorse any history of overt manic symptoms. Principal Problem: Bipolar disorder ([place]) Diagnosis: Principal Problem:   Bipolar disorder ([place]) Active Problems:   Major depression, recurrent ([place])  Total Time spent with patient: 20 minutes  Past Psychiatric History: See  admission H&P  Past Medical History:  Past Medical History:  Diagnosis Date  . Anxiety   . Depression   . Diabetes mellitus without complication ([place])   . Headache(784.0)   . Hypertension   . IBS (irritable bowel syndrome)   . Mental disorder     Past Surgical History:  Procedure Laterality Date  . NO PAST SURGERIES     Family History:  Family History  Problem Relation Age of Onset  . Depression Maternal Aunt   . Alcohol abuse Maternal Aunt   . Drug abuse Maternal Aunt   . Drug abuse Father   . Alcohol abuse Paternal Aunt   . Drug abuse Paternal Aunt   . Alcohol abuse Maternal Uncle   . Drug abuse Maternal Uncle   . Alcohol abuse Paternal Uncle   . Drug abuse Paternal Uncle    Family Psychiatric  History: See admission H&P Social History:  Social History   Substance and Sexual Activity  Alcohol Use Not Currently  . Alcohol/week: 0.0 standard drinks   Comment: socially     Social History   Substance and Sexual Activity  Drug Use No    Social History   Socioeconomic History  . Marital status: Divorced    Spouse name: Not on file  . Number of children: 1  . Years of education: Not on file  . Highest education level: Not on file  Occupational History  . Occupation: Unemployed  Social Needs  . Financial resource strain: Not on file  . Food insecurity:    Worry: Not on file    Inability: Not on file  . Transportation  needs:    Medical: Not on file    Non-medical: Not on file  Tobacco Use  . Smoking status: Never Smoker  . Smokeless tobacco: Never Used  Substance and Sexual Activity  . Alcohol use: Not Currently    Alcohol/week: 0.0 standard drinks    Comment: socially  . Drug use: No  . Sexual activity: Not Currently  Lifestyle  . Physical activity:    Days per week: Not on file    Minutes per session: Not on file  . Stress: Not on file  Relationships  . Social connections:    Talks on phone: Not on file    Gets together: Not on file     Attends religious service: Not on file    Active member of club or organization: Not on file    Attends meetings of clubs or organizations: Not on file    Relationship status: Not on file  Other Topics Concern  . Not on file  Social History Narrative  . Not on file   Additional Social History:    History of alcohol / drug use?: No history of alcohol / drug abuse  Sleep: Improving  Appetite:  Good  Current Medications: Current Facility-Administered Medications  Medication Dose Route Frequency Provider Last Rate Last Dose  . alum & mag hydroxide-simeth (MAALOX/MYLANTA) 200-200-20 MG/5ML suspension 30 mL  30 mL Oral Q4H PRN Sharma Covert, MD   30 mL at 05/06/18 1716  . antiseptic oral rinse (BIOTENE) solution 15 mL  15 mL Mouth Rinse PRN Sharma Covert, MD   15 mL at 05/08/18 2314  . ARIPiprazole (ABILIFY) tablet 5 mg  5 mg Oral Daily Shakelia Scrivner, Myer Peer, MD   5 mg at 05/10/18 0734  . cloNIDine (CATAPRES) tablet 0.1 mg  0.1 mg Oral TID PRN Sharma Covert, MD   0.1 mg at 05/10/18 1147  . hydrOXYzine (ATARAX/VISTARIL) tablet 25 mg  25 mg Oral TID PRN Sharma Covert, MD   25 mg at 05/06/18 2108  . ibuprofen (ADVIL,MOTRIN) tablet 400 mg  400 mg Oral Q6H PRN Sharma Covert, MD   400 mg at 05/09/18 0836  . ketotifen (ZADITOR) 0.025 % ophthalmic solution 1 drop  1 drop Both Eyes BID Sharma Covert, MD   1 drop at 05/10/18 0734  . magnesium hydroxide (MILK OF MAGNESIA) suspension 30 mL  30 mL Oral Daily PRN Sharma Covert, MD      . metFORMIN (GLUCOPHAGE) tablet 500 mg  500 mg Oral Q breakfast Sharma Covert, MD   500 mg at 05/10/18 0735  . sertraline (ZOLOFT) tablet 50 mg  50 mg Oral Daily Tinsley Everman, Myer Peer, MD   50 mg at 05/10/18 0734  . traZODone (DESYREL) tablet 50 mg  50 mg Oral QHS PRN Sharma Covert, MD        Lab Results:  Results for orders placed or performed during the hospital encounter of 05/03/18 (from the past 48 hour(s))  Glucose, capillary      Status: None   Collection Time: 05/08/18  5:01 PM  Result Value Ref Range   Glucose-Capillary 73 70 - 99 mg/dL   Comment 1 Notify RN    Comment 2 Document in Chart   T3, free     Status: None   Collection Time: 05/08/18  6:24 PM  Result Value Ref Range   T3, Free 2.6 2.0 - 4.4 pg/mL    Comment: (NOTE) Performed At: [facility]  [place], [place] [id number] Rush Farmer MD [id number]   T4, free     Status: None   Collection Time: 05/08/18  6:24 PM  Result Value Ref Range   Free T4 0.90 0.82 - 1.77 ng/dL    Comment: (NOTE) Biotin ingestion may interfere with free T4 tests. If the results are inconsistent with the TSH level, previous test results, or the clinical presentation, then consider biotin interference. If needed, order repeat testing after stopping biotin. Performed at [facility], [place] [address], [place], [place] [address]   Glucose, capillary     Status: Abnormal   Collection Time: 05/09/18  5:39 AM  Result Value Ref Range   Glucose-Capillary 127 (H) 70 - 99 mg/dL   Comment 1 Notify RN   Glucose, capillary     Status: Abnormal   Collection Time: 05/09/18 11:47 AM  Result Value Ref Range   Glucose-Capillary 114 (H) 70 - 99 mg/dL   Comment 1 Notify RN    Comment 2 Document in Chart   Glucose, capillary     Status: Abnormal   Collection Time: 05/09/18  5:03 PM  Result Value Ref Range   Glucose-Capillary 108 (H) 70 - 99 mg/dL   Comment 1 Notify RN    Comment 2 Document in Chart   Glucose, capillary     Status: Abnormal   Collection Time: 05/10/18  6:16 AM  Result Value Ref Range   Glucose-Capillary 117 (H) 70 - 99 mg/dL  Glucose, capillary     Status: Abnormal   Collection Time: 05/10/18 11:54 AM  Result Value Ref Range   Glucose-Capillary 103 (H) 70 - 99 mg/dL   Comment 1 Notify RN     Blood Alcohol level:  Lab Results  Component Value Date   ETH <10 05/01/2018   ETH <11 54/00/8676    Metabolic Disorder Labs: Lab  Results  Component Value Date   HGBA1C 7.6 (H) 05/02/2018   MPG 171.42 05/02/2018   No results found for: PROLACTIN No results found for: CHOL, TRIG, HDL, CHOLHDL, VLDL, LDLCALC  Physical Findings: AIMS: Facial and Oral Movements Muscles of Facial Expression: None, normal Lips and Perioral Area: None, normal Jaw: None, normal Tongue: None, normal,Extremity Movements Upper (arms, wrists, hands, fingers): None, normal Lower (legs, knees, ankles, toes): None, normal, Trunk Movements Neck, shoulders, hips: None, normal, Overall Severity Severity of abnormal movements (highest score from questions above): None, normal Incapacitation due to abnormal movements: None, normal Patient's awareness of abnormal movements (rate only patient's report): No Awareness, Dental Status Current problems with teeth and/or dentures?: No Does patient usually wear dentures?: No  CIWA:  CIWA-Ar Total: 1 COWS:  COWS Total Score: 2  Musculoskeletal: Strength & Muscle Tone: within normal limits Gait & Station: normal Patient leans: N/A  Psychiatric Specialty Exam: Physical Exam  Nursing note and vitals reviewed. Constitutional: She is oriented to person, place, and time. She appears well-developed and well-nourished.  Cardiovascular: Normal rate.  Respiratory: Effort normal.  Neurological: She is alert and oriented to person, place, and time.    Review of Systems  Constitutional: Negative.   Respiratory: Negative.   Cardiovascular: Negative.   Psychiatric/Behavioral: Positive for depression (improving). Negative for hallucinations, memory loss, substance abuse and suicidal ideas. The patient is not nervous/anxious and does not have insomnia.   Denies chest pain, no shortness of breath, no vomiting. Reports dizziness earlier today, now improved . No current lightheadedness  Blood pressure (!) 160/104, pulse 93, temperature 98.4  F (36.9 C), temperature source Oral, resp. rate 18, height 5' (1.524 m),  weight 108.4 kg, SpO2 100 %.Body mass index is 46.68 kg/m.  General Appearance: Improving grooming  Eye Contact:  Good  Speech:  Normal Rate  Volume:  Normal  Mood:  Partially improved mood, states she feels better, still presents depressed   Affect:  Less constricted than yesterday, smiles at times appropriately  Thought Process:  Linear and Descriptions of Associations: Intact  Orientation:  Other:  Fully alert and attentive  Thought Content:  At this time denies hallucinations, does not appear internally preoccupied, no delusions are expressed  Suicidal Thoughts:  No-day denies any suicidal thoughts/denies  suicidal plans or intentions and contracts for safety  Homicidal Thoughts:  No-denies homicidal or violent ideations  Memory:  Recent and remote grossly intact  Judgement:  Fair-improving  Insight:  Fair-improving  Psychomotor Activity:  Normal-no current restlessness or agitation  Concentration:  Concentration: Good and Attention Span: Good  Recall:  Good  Fund of Knowledge:  Good  Language:  Good  Akathisia:  No  Handed:  Right  AIMS (if indicated):     Assets:  Communication Skills Desire for Improvement Leisure Time Social Support  ADL's:  Intact  Cognition:  WNL  Sleep:  Number of Hours: 6.25   Assessment -  52 year old female, lives alone, presented to the ED on 1/13 following a suicide attempt by overdosing on Benadryl.  Patient reports worsening depression and suicidal ideations where partly due to not getting disability that she had applied for.  Patient reports some improvement compared to admission.  Mood is described as improving, affect remains vaguely constricted/anxious although more reactive/improves during session.  Denies suicidal ideations.  On Zoloft and Abilify (new medication regimen).  Describes some dizziness earlier today after taking the medications, now resolved.  Blood pressure has trended high, without associated symptoms, has been recently  started on Norvasc which she is not currently taking.   Treatment Plan Summary: Daily contact with patient to assess and evaluate symptoms and progress in treatment and Medication management Treatment plan reviewed as below today 1/22 Continue to encourage group and milieu participation to work on coping skills and symptom reduction Continue  Zoloft 50 mg daily for depression Decrease Abilify to 2 mg daily for mood disorder/augmentation-rationale to decrease dose is to minimize potential side effects. Continue Trazodone 50 mg  QHS PRN insomnia Continue Vistaril 25 mg TID PRN anxiety Continue Metformin for DM management Start Norvasc 10 mgrs QDAY for HTN Treatment team working on disposition planning options  Jenne Campus, MD 05/10/2018, 4:25 PM   Patient ID: Patricia Rodriguez, female   DOB: [birth year], 53 y.o.   MRN: [id number]

## 2018-05-10 NOTE — Progress Notes (Signed)
Nursing Progress Note 1900-0700  Pt is pleasant and cooperative. Pt is animated in affect and in a pleasant mood. Pt's BP was lower this evening, although pt had three doses of prn Clonidine today. Pt reported a headache and was given PRN Motrin. Pt denies SI/HI/AVH and contracts for safety.

## 2018-05-10 NOTE — Progress Notes (Addendum)
Patient ID: Patricia Rodriguez, female   DOB: 1965-11-14, 53 y.o.   MRN: [id number]  Nursing Progress Note [id number]  Data: On initial approach, patient is seen awake reading in bed. Patient presents pleasant and cooperative with complaints of mild anxiety. Patient compliant with scheduled medications. Patient denies pain/physical complaints. Patient completed self-inventory sheet and rates depression, hopelessness, and anxiety 8,8,8 respectively. Patient rates their sleep and appetite as fair/good respectively. Patient states goal for today is to "getting new med combinations on". Patient is seen attending groups but does isolate to her room. Patient currently denies SI/HI/AVH.   Action: Patient is educated about and provided medication per provider's orders. Patient safety maintained with q15 min safety checks and frequent rounding. Low fall risk precautions in place. Emotional support given. 1:1 interaction and active listening provided. Patient encouraged to attend meals, groups, and work on treatment plan and goals. Labs, vital signs and patient behavior monitored throughout shift.   Response: Patient remains safe on the unit at this time and agrees to come to staff with any issues/concerns. Patient is interacting with peers appropriately on the unit. Will continue to support and monitor.

## 2018-05-11 LAB — GLUCOSE, CAPILLARY
Glucose-Capillary: 121 mg/dL — ABNORMAL HIGH (ref 70–99)
Glucose-Capillary: 93 mg/dL (ref 70–99)

## 2018-05-11 MED ORDER — HYDROXYZINE HCL 25 MG PO TABS: 25.0000 mg | ORAL_TABLET | Freq: Three times a day (TID) | ORAL | 0 refills | Status: DC | PRN

## 2018-05-11 MED ORDER — SERTRALINE HCL 50 MG PO TABS: 50.0000 mg | ORAL_TABLET | Freq: Every day | ORAL | 0 refills | Status: DC

## 2018-05-11 MED ORDER — ARIPIPRAZOLE 2 MG PO TABS: 2.0000 mg | ORAL_TABLET | Freq: Every day | ORAL | 0 refills | Status: DC

## 2018-05-11 MED ORDER — AMLODIPINE BESYLATE 10 MG PO TABS: 10.0000 mg | ORAL_TABLET | Freq: Every day | ORAL | 0 refills | Status: DC

## 2018-05-11 MED ORDER — METFORMIN HCL 500 MG PO TABS: 500.0000 mg | ORAL_TABLET | Freq: Every day | ORAL | 0 refills | Status: AC

## 2018-05-11 MED ORDER — KETOTIFEN FUMARATE 0.025 % OP SOLN: 1.0000 [drp] | Freq: Two times a day (BID) | OPHTHALMIC | 0 refills | Status: DC

## 2018-05-11 MED ORDER — BIOTENE DRY MOUTH MT LIQD: 15.0000 mL | OROMUCOSAL | 0 refills | Status: DC | PRN

## 2018-05-11 NOTE — BHH Suicide Risk Assessment (Signed)
[facility] Discharge Suicide Risk Assessment   Principal Problem: Depression, SI, S/P Overdose   Discharge Diagnoses: Principal Problem:   Bipolar disorder ([place]) Active Problems:   Major depression, recurrent ([place])   Total Time spent with patient: 30 minutes  Musculoskeletal: Strength & Muscle Tone: within normal limits Gait & Station: normal Patient leans: N/A  Psychiatric Specialty Exam: ROS no headache, no chest pain, no shortness of breath, no vomiting, no fever, no chills   Blood pressure 127/87, pulse 91, temperature 98 F (36.7 C), temperature source Oral, resp. rate 18, height 5' (1.524 m), weight 108.4 kg, SpO2 100 %.Body mass index is 46.68 kg/m.  General Appearance: improved grooming   Eye Contact::  Good  Speech:  Normal Rate409  Volume:  Normal  Mood:  improving mood, reports feeling " a lot better"  Affect:  Appropriate and more reactive  Thought Process:  Linear and Descriptions of Associations: Intact  Orientation:  Other:  fully alert and attentive  Thought Content:  no hallucinations, no delusions  Suicidal Thoughts:  No denies suicidal or self injurious ideations, no homicidal or violent ideations  Homicidal Thoughts:  No  Memory:  recent and remote grossly intact   Judgement:  Other:  improving   Insight:  improving   Psychomotor Activity:  Normal  Concentration:  Good  Recall:  Good  Fund of Knowledge:Good  Language: Good  Akathisia:  Negative  Handed:  Right  AIMS (if indicated):     Assets:  Desire for Improvement Resilience  Sleep:  Number of Hours: 4.25  Cognition: WNL  ADL's:  Intact   Mental Status Per Nursing Assessment::   On Admission:  Suicidal ideation indicated by patient, Self-harm thoughts, Belief that plan would result in death  Demographic Factors:  12, lives alone, unemployed, no current source of income   Loss Factors: Was recently turned down for disability, financial issues   Historical Factors: History of prior psychiatric  admissions, states she has been diagnosed with Bipolar Disorder ( but currently does not endorse clear history of mania or hypomania) and with PTSD in the past .   Risk Reduction Factors:   Sense of responsibility to family and Positive coping skills or problem solving skills  Continued Clinical Symptoms:  At this time patient is alert, attentive, well related, pleasant , mood improved, affect more reactive, no thought disorder, denies any suicidal or self injurious ideations, no homicidal or violent ideations, no hallucinations, no delusions, not internally preoccupied. Currently future oriented, states she plans to go live with her mother for a period of time and is considering appealing disability decision . Behavior on unit calm and in good control. Pleasant on approach. Denies medication side effects. We have reviewed side effects, including potential risk of metabolic and motor side effects associated with Abilify BP improved - today 127/87. CBG today 93   Cognitive Features That Contribute To Risk:  No gross cognitive deficits noted upon discharge. Is alert , attentive, and oriented x 3   Suicide Risk:  Mild:  Suicidal ideation of limited frequency, intensity, duration, and specificity.  There are no identifiable plans, no associated intent, mild dysphoria and related symptoms, good self-control (both objective and subjective assessment), few other risk factors, and identifiable protective factors, including available and accessible social support.  Follow-up Information    [facility] Follow up on 05/12/2018.   Specialty:  Behavioral Health Why:  Hospital follow up appointment 1/24 at 8:00a.  Please bring your photo ID, proof of insurance, SSN, current  medications, and discharge paperwork from this hospitalization.  Contact information: [place] [address] [phone]           Plan Of Care/Follow-up recommendations:  Activity:  as tolerated  Diet:  heart  healthy, diabetic diet Tests:  NA Other:  See below  Patient expresses readiness for discharge and is leaving unit in good spirits  Plans to go live with her mother for the time being  Plans to follow up as above  She is encouraged to follow up at [facility] for medical management and monitoring as needed .  Jenne Campus, MD 05/11/2018, 11:55 AM

## 2018-05-11 NOTE — Progress Notes (Signed)
  [facility] Adult Case Management Discharge Plan :  Will you be returning to the same living situation after discharge:  Yes,  home At discharge, do you have transportation home?: Yes,  family picking up Do you have the ability to pay for your medications: No. No income, pending Medicaid.   Release of information consent forms completed and in the chart. Letters on chart.  Patient to Follow up at: Follow-up Information    [facility] Follow up on 05/12/2018.   Specialty:  Behavioral Health Why:  Hospital follow up appointment 1/24 at 8:00a.  Please bring your photo ID, proof of insurance, SSN, current medications, and discharge paperwork from this hospitalization.  Contact information: [place] [address] [phone]           Next level of care provider has access to [place] and Suicide Prevention discussed: Yes,  with daughter  Have you used any form of tobacco in the last 30 days? (Cigarettes, Smokeless Tobacco, Cigars, and/or Pipes): No  Has patient been referred to the Quitline?: N/A patient is not a smoker  Patient has been referred for addiction treatment: Yes  Joellen Jersey, [place] 05/11/2018, 8:56 AM

## 2018-05-11 NOTE — Progress Notes (Signed)
Patient discharged to lobby. Patient was stable and appreciative at that time. All papers, samples and prescriptions were given and valuables returned. Verbal understanding expressed. Denies SI/HI and A/VH. Patient given opportunity to express concerns and ask questions.  

## 2018-05-11 NOTE — Discharge Summary (Addendum)
Physician Discharge Summary Note  Patient:  Patricia Rodriguez is an 53 y.o., female MRN:  [id number] DOB:  Jun 04, 1965 Patient phone:  [phone] (home)  Patient address:   [place] [address],  Total Time spent with patient: 15 minutes  Date of Admission:  05/03/2018 Date of Discharge: 05/11/2018  Reason for Admission:  Suicide attempt via O/D on 24 Benadryl tablets  Principal Problem: Bipolar disorder [facility]) Discharge Diagnoses: Principal Problem:   Bipolar disorder ([place]) Active Problems:   Major depression, recurrent ([place])   Past Psychiatric History: Per admission H&P: Patient has been admitted to the psychiatric hospital on several occasions after intentional overdoses.  Her last psychiatric hospitalization was at [facility] in 2016.  She has been previously treated with Wellbutrin XL, Latuda, Xanax and several other medications which have not been very effective.  She has gone without medication for over a year and a half currently because the loss of her insurance.  Past Medical History:  Past Medical History:  Diagnosis Date  . Anxiety   . Depression   . Diabetes mellitus without complication ([place])   . Headache(784.0)   . Hypertension   . IBS (irritable bowel syndrome)   . Mental disorder     Past Surgical History:  Procedure Laterality Date  . NO PAST SURGERIES     Family History:  Family History  Problem Relation Age of Onset  . Depression Maternal Aunt   . Alcohol abuse Maternal Aunt   . Drug abuse Maternal Aunt   . Drug abuse Father   . Alcohol abuse Paternal Aunt   . Drug abuse Paternal Aunt   . Alcohol abuse Maternal Uncle   . Drug abuse Maternal Uncle   . Alcohol abuse Paternal Uncle   . Drug abuse Paternal Uncle    Family Psychiatric  History: Per admission H&P: She had some distant relatives who had attempted suicide in the past Social History:  Social History   Substance and Sexual Activity  Alcohol Use Not Currently  .  Alcohol/week: 0.0 standard drinks   Comment: socially     Social History   Substance and Sexual Activity  Drug Use No    Social History   Socioeconomic History  . Marital status: Divorced    Spouse name: Not on file  . Number of children: 1  . Years of education: Not on file  . Highest education level: Not on file  Occupational History  . Occupation: Unemployed  Social Needs  . Financial resource strain: Not on file  . Food insecurity:    Worry: Not on file    Inability: Not on file  . Transportation needs:    Medical: Not on file    Non-medical: Not on file  Tobacco Use  . Smoking status: Never Smoker  . Smokeless tobacco: Never Used  Substance and Sexual Activity  . Alcohol use: Not Currently    Alcohol/week: 0.0 standard drinks    Comment: socially  . Drug use: No  . Sexual activity: Not Currently  Lifestyle  . Physical activity:    Days per week: Not on file    Minutes per session: Not on file  . Stress: Not on file  Relationships  . Social connections:    Talks on phone: Not on file    Gets together: Not on file    Attends religious service: Not on file    Active member of club or organization: Not on file    Attends meetings  of clubs or organizations: Not on file    Relationship status: Not on file  Other Topics Concern  . Not on file  Social History Narrative  . Not on file    Hospital Course:  Per admission H&P 05/03/2018: Patient is a 53 year old female with a reported past psychiatric history significant for depression, bipolar disorder, [place] phobia, generalized anxiety who presented to the [facility] emergency department on 1/13 after an intentional overdose of 24 -25 mg Benadryl tablets. It was estimated that this was approximately 600 mg. The patient stated that she has been attempting to get disability because of her psychiatric illness. She stated the instigating factor of this overdose was because she was turned down for disability. She is  unable to financially support her self, and because of her psychiatric illness she is unable to continue to work. She stated that this had pushed her over the edge. The patient admitted that she had attempted suicide on 5 or 6 previous occasions. Her last serious overdose was approximately 4 years ago. The patient overdosed on Xanax at that time. Most recently she had been treated with Latuda, Wellbutrin XL and Xanax. Her last Xanax prescription was in February 2018. She lost her insurance at that time, and has been unable to afford treatment since then. She stated in the past that she had periods of time where she would be awake for 2 3 days at a time without getting tired, but never left her apartment. She stated she had periods of time where her mood was elevated, but was unable to quantitate whether or not it was euphoria. She has a past medical history significant for diabetes mellitus type 2, hypertension, migraines, dry eyes. She was transferred to our facility for evaluation and stabilization.  Patricia Rodriguez was admitted after a suicide attempt via overdose on Benadryl. Suicide attempt was related to her application for disability being rejected. She had been off all medications for more than a year due to financial constraints. She was restarted on Wellbutrin and started on Trileptal with Vistaril PRN anxiety, trazodone PRN insomnia. She was started on metformin 500 mg daily for diabetes (a1c 7.6). Norvasc was started for HTN. Wellbutrin was discontinued due to elevated blood pressure with continued anxiety symptoms. Trileptal was also discontinued due to persistent low mood. Zoloft and Abilify were started. Patricia Rodriguez remained on the [facility] unit for 9 days. She stabilized with medication and therapy. She was discharged on the medications listed below. She has shown improvement with improved mood, affect, sleep, appetite, and interaction. She denies any SI/HI/AVH and contracts for safety. She  agrees to follow up at [facility]. She is provided with prescriptions and medication samples upon discharge.  Physical Findings: AIMS: Facial and Oral Movements Muscles of Facial Expression: None, normal Lips and Perioral Area: None, normal Jaw: None, normal Tongue: None, normal,Extremity Movements Upper (arms, wrists, hands, fingers): None, normal Lower (legs, knees, ankles, toes): None, normal, Trunk Movements Neck, shoulders, hips: None, normal, Overall Severity Severity of abnormal movements (highest score from questions above): None, normal Incapacitation due to abnormal movements: None, normal Patient's awareness of abnormal movements (rate only patient's report): No Awareness, Dental Status Current problems with teeth and/or dentures?: No Does patient usually wear dentures?: No  CIWA:  CIWA-Ar Total: 1 COWS:  COWS Total Score: 2  Musculoskeletal: Strength & Muscle Tone: within normal limits Gait & Station: normal Patient leans: N/A  Psychiatric Specialty Exam: Physical Exam  Nursing note and vitals reviewed. Constitutional: She is  oriented to person, place, and time. She appears well-developed and well-nourished.  Cardiovascular: Normal rate.  Respiratory: Effort normal.  Neurological: She is alert and oriented to person, place, and time.    Review of Systems  Constitutional: Negative.   Respiratory: Negative.   Cardiovascular: Negative.   Psychiatric/Behavioral: Positive for depression (improving). Negative for hallucinations, memory loss, substance abuse and suicidal ideas. The patient is not nervous/anxious and does not have insomnia.     Blood pressure 127/87, pulse 91, temperature 98 F (36.7 C), temperature source Oral, resp. rate 18, height 5' (1.524 m), weight 108.4 kg, SpO2 100 %.Body mass index is 46.68 kg/m.  See MD's discharge SRA     Have you used any form of tobacco in the last 30 days? (Cigarettes, Smokeless Tobacco, Cigars, and/or Pipes): No  Has this  patient used any form of tobacco in the last 30 days? (Cigarettes, Smokeless Tobacco, Cigars, and/or Pipes)  No  Blood Alcohol level:  Lab Results  Component Value Date   ETH <10 05/01/2018   ETH <11 76/73/4193    Metabolic Disorder Labs:  Lab Results  Component Value Date   HGBA1C 7.6 (H) 05/02/2018   MPG 171.42 05/02/2018   No results found for: PROLACTIN No results found for: CHOL, TRIG, HDL, CHOLHDL, VLDL, LDLCALC  See Psychiatric Specialty Exam and Suicide Risk Assessment completed by Attending Physician prior to discharge.  Discharge destination:  Home  Is patient on multiple antipsychotic therapies at discharge:  No   Has Patient had three or more failed trials of antipsychotic monotherapy by history:  No  Recommended Plan for Multiple Antipsychotic Therapies: NA  Discharge Instructions    Discharge instructions   Complete by:  As directed    Patient is instructed to take all prescribed medications as recommended. Report any side effects or adverse reactions to your outpatient psychiatrist. Patient is instructed to abstain from alcohol and illegal drugs while on prescription medications. In the event of worsening symptoms, patient is instructed to call the crisis hotline, 911, or go to the nearest emergency department for evaluation and treatment.     Allergies as of 05/11/2018      Reactions   Oxycodone Other (See Comments)   unknown   Penicillins Hives   DID THE REACTION INVOLVE: Swelling of the face/tongue/throat, SOB, or low BP? Unknown Sudden or severe rash/hives, skin peeling, or the inside of the mouth or nose? Unknown Did it require medical treatment? Unknown When did it last happen?unk If all above answers are "NO", may proceed with cephalosporin use.      Medication List    STOP taking these medications   acetaminophen 325 MG tablet Commonly known as:  TYLENOL     TAKE these medications     Indication  amLODipine 10 MG tablet Commonly  known as:  NORVASC Take 1 tablet (10 mg total) by mouth daily. For high blood pressure What changed:  additional instructions  Indication:  High Blood Pressure Disorder   antiseptic oral rinse Liqd 15 mLs by Mouth Rinse route as needed for dry mouth. (May buy over the counter)  Indication:  Dry mouth   ARIPiprazole 2 MG tablet Commonly known as:  ABILIFY Take 1 tablet (2 mg total) by mouth daily. For mood Start taking on:  May 12, 2018  Indication:  Mood   hydrOXYzine 25 MG tablet Commonly known as:  ATARAX/VISTARIL Take 1 tablet (25 mg total) by mouth 3 (three) times daily as needed for itching or anxiety.  Indication:  Feeling Anxious   ketotifen 0.025 % ophthalmic solution Commonly known as:  ZADITOR Place 1 drop into both eyes 2 (two) times daily. For dry eyes  Indication:  Allergic Conjunctivitis   metFORMIN 500 MG tablet Commonly known as:  GLUCOPHAGE Take 1 tablet (500 mg total) by mouth daily with breakfast. For high blood sugars Start taking on:  May 12, 2018  Indication:  Type 2 Diabetes   sertraline 50 MG tablet Commonly known as:  ZOLOFT Take 1 tablet (50 mg total) by mouth daily. For mood Start taking on:  May 12, 2018  Indication:  Mood      Follow-up Information    [facility] Follow up on 05/12/2018.   Specialty:  Behavioral Health Why:  Hospital follow up appointment 1/24 at 8:00a.  Please bring your photo ID, proof of insurance, SSN, current medications, and discharge paperwork from this hospitalization.  Contact information: [place] [address] [phone]        [facility]. Call.   Why:  Please follow up within 3 days for primary care services at discharge. Be sure to bring any discharge paperwork, including a list of your prescribed medications.  Contact information: [address] [place] [address] [phone]          Follow-up recommendations: Activity  as tolerated. Diet as recommended by primary care physician. Keep all scheduled follow-up appointments as recommended.   Comments:   Patient is instructed to take all prescribed medications as recommended. Report any side effects or adverse reactions to your outpatient psychiatrist. Patient is instructed to abstain from alcohol and illegal drugs while on prescription medications. In the event of worsening symptoms, patient is instructed to call the crisis hotline, 911, or go to the nearest emergency department for evaluation and treatment.  Signed: Connye Burkitt, NP 05/11/2018, 2:16 PM   Patient seen, Suicide Assessment Completed.  Disposition Plan Reviewed

## 2019-02-19 ENCOUNTER — Other Ambulatory Visit: Payer: Self-pay

## 2019-02-19 DIAGNOSIS — Z20822 Contact with and (suspected) exposure to covid-19: Secondary | ICD-10-CM

## 2019-02-20 LAB — NOVEL CORONAVIRUS, NAA: SARS-CoV-2, NAA: NOT DETECTED

## 2019-09-12 ENCOUNTER — Other Ambulatory Visit: Payer: Self-pay | Admitting: Gastroenterology

## 2019-11-02 ENCOUNTER — Other Ambulatory Visit (HOSPITAL_COMMUNITY)
Admission: RE | Admit: 2019-11-02 | Discharge: 2019-11-02 | Disposition: A | Discharge status: Home / Self Care | Payer: Medicare Other | Source: Ambulatory Visit | Attending: Gastroenterology | Admitting: Gastroenterology

## 2019-11-02 DIAGNOSIS — Z20822 Contact with and (suspected) exposure to covid-19: Secondary | ICD-10-CM | POA: Diagnosis present

## 2019-11-02 LAB — SARS CORONAVIRUS 2 (TAT 6-24 HRS): SARS Coronavirus 2: NEGATIVE

## 2019-11-05 NOTE — Progress Notes (Signed)
Pre op call done for endo procedure tomorrow 11/06/19. Patient states she has been quarantined at home since covid test, will arrive to hospital at [facility], and has a driver taking her home post procedure. All questions addressed.

## 2019-11-06 ENCOUNTER — Ambulatory Visit (HOSPITAL_COMMUNITY): Payer: Medicare Other | Admitting: Certified Registered"

## 2019-11-06 ENCOUNTER — Encounter (HOSPITAL_COMMUNITY)
Admission: RE | Disposition: A | Discharge status: Home / Self Care | Payer: Self-pay | Source: Home / Self Care | Attending: Gastroenterology

## 2019-11-06 ENCOUNTER — Encounter (HOSPITAL_COMMUNITY): Payer: Self-pay | Admitting: Gastroenterology

## 2019-11-06 ENCOUNTER — Ambulatory Visit (HOSPITAL_COMMUNITY)
Admission: RE | Admit: 2019-11-06 | Discharge: 2019-11-06 | Disposition: A | Discharge status: Home / Self Care | Payer: Medicare Other | Attending: Gastroenterology | Admitting: Gastroenterology

## 2019-11-06 DIAGNOSIS — Z7984 Long term (current) use of oral hypoglycemic drugs: Secondary | ICD-10-CM | POA: Insufficient documentation

## 2019-11-06 DIAGNOSIS — E119 Type 2 diabetes mellitus without complications: Secondary | ICD-10-CM | POA: Insufficient documentation

## 2019-11-06 DIAGNOSIS — K449 Diaphragmatic hernia without obstruction or gangrene: Secondary | ICD-10-CM | POA: Insufficient documentation

## 2019-11-06 DIAGNOSIS — I10 Essential (primary) hypertension: Secondary | ICD-10-CM | POA: Diagnosis not present

## 2019-11-06 DIAGNOSIS — K5730 Diverticulosis of large intestine without perforation or abscess without bleeding: Secondary | ICD-10-CM | POA: Insufficient documentation

## 2019-11-06 DIAGNOSIS — Z79899 Other long term (current) drug therapy: Secondary | ICD-10-CM | POA: Insufficient documentation

## 2019-11-06 DIAGNOSIS — K228 Other specified diseases of esophagus: Secondary | ICD-10-CM | POA: Diagnosis not present

## 2019-11-06 DIAGNOSIS — F419 Anxiety disorder, unspecified: Secondary | ICD-10-CM | POA: Insufficient documentation

## 2019-11-06 DIAGNOSIS — Z6841 Body Mass Index (BMI) 40.0 and over, adult: Secondary | ICD-10-CM | POA: Diagnosis not present

## 2019-11-06 DIAGNOSIS — F319 Bipolar disorder, unspecified: Secondary | ICD-10-CM | POA: Diagnosis not present

## 2019-11-06 DIAGNOSIS — K2950 Unspecified chronic gastritis without bleeding: Secondary | ICD-10-CM | POA: Diagnosis not present

## 2019-11-06 DIAGNOSIS — K648 Other hemorrhoids: Secondary | ICD-10-CM | POA: Insufficient documentation

## 2019-11-06 DIAGNOSIS — D509 Iron deficiency anemia, unspecified: Secondary | ICD-10-CM | POA: Diagnosis present

## 2019-11-06 HISTORY — PX: COLONOSCOPY WITH PROPOFOL: SHX5780

## 2019-11-06 HISTORY — DX: Bipolar disorder, unspecified: F31.9

## 2019-11-06 HISTORY — PX: ESOPHAGOGASTRODUODENOSCOPY (EGD) WITH PROPOFOL: SHX5813

## 2019-11-06 HISTORY — PX: BIOPSY: SHX5522

## 2019-11-06 HISTORY — DX: Anemia, unspecified: D64.9

## 2019-11-06 LAB — GLUCOSE, CAPILLARY: Glucose-Capillary: 177 mg/dL — ABNORMAL HIGH (ref 70–99)

## 2019-11-06 SURGERY — COLONOSCOPY WITH PROPOFOL
Anesthesia: Monitor Anesthesia Care

## 2019-11-06 MED ORDER — LIDOCAINE 2% (20 MG/ML) 5 ML SYRINGE
INTRAMUSCULAR | Status: DC | PRN
  Administered 2019-11-06: 40 mg via INTRAVENOUS

## 2019-11-06 MED ORDER — PROPOFOL 10 MG/ML IV BOLUS
INTRAVENOUS | Status: DC | PRN
  Administered 2019-11-06 (×6): 20 mg via INTRAVENOUS
  Administered 2019-11-06: 50 mg via INTRAVENOUS
  Administered 2019-11-06 (×7): 20 mg via INTRAVENOUS

## 2019-11-06 MED ORDER — LACTATED RINGERS IV SOLN
INTRAVENOUS | Status: DC | PRN
Start: 2019-11-06 — End: 2019-11-06

## 2019-11-06 SURGICAL SUPPLY — 25 items

## 2019-11-06 NOTE — Anesthesia Procedure Notes (Signed)
Procedure Name: MAC Date/Time: 11/06/2019 9:43 AM Performed by: Cynda Familia, CRNA Pre-anesthesia Checklist: Patient identified, Emergency Drugs available, Suction available, Patient being monitored and Timeout performed Patient Re-evaluated:Patient Re-evaluated prior to induction Oxygen Delivery Method: Simple face mask Placement Confirmation: breath sounds checked- equal and bilateral and positive ETCO2 Dental Injury: Teeth and Oropharynx as per pre-operative assessment

## 2019-11-06 NOTE — H&P (Signed)
Primary Care Physician:  Maude Leriche, PA-C Primary Gastroenterologist:  Dr. Alessandra Bevels  Reason for Consultation:  Anemia   HPI: Patricia Rodriguez is a 54 y.o. female with past medical history of diabetes and morbid obesity referred to GI for evaluation of anemia. Patient's hemoglobin was normal in 2018. Blood work in March 2021 showed hemoglobin of 9.6 with low MCV. Repeat blood work in April 2021 showed hemoglobin of 9.3.        Patient denies any GI symptoms. Denies abdominal pain, nausea or vomiting. Denies reflux, dysphagia or odynophagia. Denies diarrhea or constipation. Denies seeing any blood in the stool or black stool. Denies unintentional weight loss.        Denies significant NSAID use.        No previous EGD or colonoscopy. No family history of colon cancer.  Past Medical History:  Diagnosis Date  . Anemia   . Anxiety   . Bipolar disorder ([place])   . Depression   . Diabetes mellitus without complication ([place])   . Headache(784.0)   . Hypertension   . IBS (irritable bowel syndrome)   . Mental disorder     Past Surgical History:  Procedure Laterality Date  . NO PAST SURGERIES      Prior to Admission medications   Medication Sig Start Date End Date Taking? Authorizing Provider  ARIPiprazole (ABILIFY) 15 MG tablet Take 15 mg by mouth daily.   Yes [provider]  atorvastatin (LIPITOR) 20 MG tablet Take 20 mg by mouth daily.   Yes [provider]  benztropine (COGENTIN) 1 MG tablet Take 1 mg by mouth daily.   Yes [provider]  busPIRone (BUSPAR) 10 MG tablet Take 10 mg by mouth 3 (three) times daily.   Yes [provider]  gabapentin (NEURONTIN) 100 MG capsule Take 100 mg by mouth 3 (three) times daily.   Yes [provider]  hydrochlorothiazide (HYDRODIURIL) 25 MG tablet Take 25 mg by mouth daily.   Yes [provider]  metFORMIN (GLUCOPHAGE) 500 MG tablet Take 1 tablet (500 mg total) by mouth daily with  breakfast. For high blood sugars Patient taking differently: Take 1,000 mg by mouth 2 (two) times daily with a meal. For high blood sugars 05/12/18  Yes Connye Burkitt, NP  prazosin (MINIPRESS) 1 MG capsule Take 1 mg by mouth at bedtime.   Yes [provider]  vortioxetine HBr (TRINTELLIX) 20 MG TABS tablet Take 20 mg by mouth daily.   Yes [provider]    Scheduled Meds: Continuous Infusions: PRN Meds:.  Allergies as of 09/12/2019 - Review Complete 05/06/2018  Allergen Reaction Noted  . Oxycodone Other (See Comments) 10/27/2015  . Penicillins Hives 12/11/2014    Family History  Problem Relation Age of Onset  . Depression Maternal Aunt   . Alcohol abuse Maternal Aunt   . Drug abuse Maternal Aunt   . Drug abuse Father   . Alcohol abuse Paternal Aunt   . Drug abuse Paternal Aunt   . Alcohol abuse Maternal Uncle   . Drug abuse Maternal Uncle   . Alcohol abuse Paternal Uncle   . Drug abuse Paternal Uncle     Social History   Socioeconomic History  . Marital status: Divorced    Spouse name: Not on file  . Number of children: 1  . Years of education: Not on file  . Highest education level: Not on file  Occupational History  . Occupation: Unemployed  Tobacco Use  .  Smoking status: Never Smoker  . Smokeless tobacco: Never Used  Vaping Use  . Vaping Use: Never used  Substance and Sexual Activity  . Alcohol use: Not Currently    Alcohol/week: 0.0 standard drinks    Comment: socially  . Drug use: No  . Sexual activity: Not Currently  Other Topics Concern  . Not on file  Social History Narrative  . Not on file   Social Determinants of Health   Financial Resource Strain:   . Difficulty of Paying Living Expenses:   Food Insecurity:   . Worried About Charity fundraiser in the Last Year:   . Arboriculturist in the Last Year:   Transportation Needs:   . Film/video editor (Medical):   Marland Kitchen Lack of Transportation (Non-Medical):   Physical Activity:    . Days of Exercise per Week:   . Minutes of Exercise per Session:   Stress:   . Feeling of Stress :   Social Connections:   . Frequency of Communication with Friends and Family:   . Frequency of Social Gatherings with Friends and Family:   . Attends Religious Services:   . Active Member of Clubs or Organizations:   . Attends Archivist Meetings:   Marland Kitchen Marital Status:   Intimate Partner Violence:   . Fear of Current or Ex-Partner:   . Emotionally Abused:   Marland Kitchen Physically Abused:   . Sexually Abused:     Review of Systems:  Physical Exam: Vital signs: There were no vitals filed for this visit.   General:   Alert,  Obese,  pleasant and cooperative in NAD Lungs:  Clear throughout to auscultation.   No wheezes, crackles, or rhonchi. No acute distress. Heart:  Regular rate and rhythm; no murmurs, clicks, rubs,  or gallops. Abdomen: soft, NT, ND, BS +  Rectal:  Deferred  GI:  Lab Results: No results for input(s): WBC, HGB, HCT, PLT in the last 72 hours. BMET No results for input(s): NA, K, CL, CO2, GLUCOSE, BUN, CREATININE, CALCIUM in the last 72 hours. LFT No results for input(s): PROT, ALBUMIN, AST, ALT, ALKPHOS, BILITOT, BILIDIR, IBILI in the last 72 hours. PT/INR No results for input(s): LABPROT, INR in the last 72 hours.   Studies/Results: No results found.  Impression/Plan: -Microcytic anemia -Morbid obesity  Recommendations ----------------------- -Proceed with EGD and colonoscopy.  Risks (bleeding, infection, bowel perforation that could require surgery, sedation-related changes in cardiopulmonary systems), benefits (identification and possible treatment of source of symptoms, exclusion of certain causes of symptoms), and alternatives (watchful waiting, radiographic imaging studies, empiric medical treatment)  were explained to patient in detail and patient wishes to proceed.    LOS: 0 days   Otis Brace  MD, [place] 11/06/2019, 9:18 AM  Contact  #  [phone]

## 2019-11-06 NOTE — Anesthesia Preprocedure Evaluation (Addendum)
Anesthesia Evaluation  Patient identified by MRN, date of birth, ID band Patient awake    Reviewed: Allergy & Precautions, NPO status , Patient's Chart, lab work & pertinent test results  Airway Mallampati: II  TM Distance: >3 FB Neck ROM: Full    Dental no notable dental hx. (+) Dental Advisory Given, Chipped, Poor Dentition, Missing   Pulmonary    Pulmonary exam normal breath sounds clear to auscultation       Cardiovascular hypertension, Pt. on medications Normal cardiovascular exam Rhythm:Regular Rate:Normal     Neuro/Psych  Headaches, PSYCHIATRIC DISORDERS Anxiety Depression Bipolar Disorder    GI/Hepatic   Endo/Other  diabetes, Type 2Morbid obesity  Renal/GU      Musculoskeletal   Abdominal   Peds negative pediatric ROS (+)  Hematology   Anesthesia Other Findings   Reproductive/Obstetrics                            Anesthesia Physical Anesthesia Plan  ASA: III  Anesthesia Plan: MAC   Post-op Pain Management:    Induction: Intravenous  PONV Risk Score and Plan: 2 and Treatment may vary due to age or medical condition  Airway Management Planned: Mask and Natural Airway  Additional Equipment: None  Intra-op Plan:   Post-operative Plan:   Informed Consent:   Plan Discussed with: Anesthesiologist  Anesthesia Plan Comments:        Anesthesia Quick Evaluation

## 2019-11-06 NOTE — Transfer of Care (Signed)
Immediate Anesthesia Transfer of Care Note  Patient: Patricia Rodriguez  Procedure(s) Performed: COLONOSCOPY WITH PROPOFOL (N/A ) ESOPHAGOGASTRODUODENOSCOPY (EGD) WITH PROPOFOL (N/A ) BIOPSY  Patient Location: PACU and Endoscopy Unit  Anesthesia Type:MAC  Level of Consciousness: awake and alert   Airway & Oxygen Therapy: Patient Spontanous Breathing and Patient connected to face mask oxygen  Post-op Assessment: Report given to RN and Post -op Vital signs reviewed and stable  Post vital signs: Reviewed and stable  Last Vitals:  Vitals Value Taken Time  BP    Temp    Pulse    Resp    SpO2      Last Pain:  Vitals:   11/06/19 0921  TempSrc: Oral  PainSc: 0-No pain         Complications: No complications documented.

## 2019-11-06 NOTE — Discharge Instructions (Signed)

## 2019-11-06 NOTE — Anesthesia Postprocedure Evaluation (Signed)
Anesthesia Post Note  Patient: [name]  Procedure(s) Performed: COLONOSCOPY WITH PROPOFOL (N/A ) ESOPHAGOGASTRODUODENOSCOPY (EGD) WITH PROPOFOL (N/A ) BIOPSY     Patient location during evaluation: PACU Anesthesia Type: MAC Level of consciousness: awake and alert Pain management: pain level controlled Vital Signs Assessment: post-procedure vital signs reviewed and stable Respiratory status: spontaneous breathing, nonlabored ventilation, respiratory function stable and patient connected to nasal cannula oxygen Cardiovascular status: stable and blood pressure returned to baseline Postop Assessment: no apparent nausea or vomiting Anesthetic complications: no   No complications documented.  Last Vitals:  Vitals:   11/06/19 1030 11/06/19 1031  BP: (!) 118/59   Pulse: 93   Resp: (!) 22   Temp:    SpO2: 95% 95%    Last Pain:  Vitals:   11/06/19 1031  TempSrc:   PainSc: 0-No pain                 Janeah Kovacich

## 2019-11-06 NOTE — Op Note (Signed)
[facility] Patient Name: Patricia Rodriguez Procedure Date: 11/06/2019 MRN: [id number] Attending MD: Otis Brace , MD Date of Birth: 06/08/1965 CSN: [id number] Age: 54 Admit Type: Inpatient Procedure:                Upper GI endoscopy Indications:              Iron deficiency anemia Providers:                Otis Brace, MD, Wynonia Sours, RN, Corie Chiquito, Technician, Glenis Smoker, CRNA Referring MD:              Medicines:                Sedation Administered by an Anesthesia Professional Complications:            No immediate complications. Estimated Blood Loss:     Estimated blood loss was minimal. Procedure:                Pre-Anesthesia Assessment:                           - Prior to the procedure, a History and Physical                            was performed, and patient medications and                            allergies were reviewed. The patient's tolerance of                            previous anesthesia was also reviewed. The risks                            and benefits of the procedure and the sedation                            options and risks were discussed with the patient.                            All questions were answered, and informed consent                            was obtained. Prior Anticoagulants: The patient has                            taken no previous anticoagulant or antiplatelet                            agents. ASA Grade Assessment: III - A patient with                            severe systemic disease. After reviewing the risks  and benefits, the patient was deemed in                            satisfactory condition to undergo the procedure.                           After obtaining informed consent, the endoscope was                            passed under direct vision. Throughout the                            procedure, the patient's blood pressure,  pulse, and                            oxygen saturations were monitored continuously. The                            [id number]-H190 ([id number]) Olympus gastroscope was                            introduced through the mouth, and advanced to the                            second part of duodenum. The patient tolerated the                            procedure well. The upper GI endoscopy was                            performed with moderate difficulty due to abnormal                            anatomy. Scope In: Scope Out: Findings:      The Z-line was regular and was found 35 cm from the incisors.      A single medium submucosal nodule with a localized distribution was       found in the upper third of the esophagus, 20 cm from the incisors.      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      Scattered mild inflammation characterized by congestion (edema) and       erythema was found in the gastric antrum. Biopsies were taken with a       cold forceps for histology.      The cardia and gastric fundus were normal on retroflexion.      The duodenal bulb, first portion of the duodenum and second portion of       the duodenum were normal. Impression:               - Z-line regular, 35 cm from the incisors.                           - Submucosal nodule found in the esophagus.                           -  Large paraesophageal hernia.                           - Chronic gastritis. Biopsied.                           - Normal duodenal bulb, first portion of the                            duodenum and second portion of the duodenum. Moderate Sedation:      Moderate (conscious) sedation was personally administered by an       anesthesia professional. The following parameters were monitored: oxygen       saturation, heart rate, blood pressure, and response to care. Recommendation:           - Patient has a contact number available for                            emergencies. The signs and symptoms  of potential                            delayed complications were discussed with the                            patient. Return to normal activities tomorrow.                            Written discharge instructions were provided to the                            patient.                           - Resume previous diet.                           - Continue present medications.                           - Await pathology results.                           - Perform an upper endoscopic ultrasound (UEUS) at                            appointment to be scheduled. Procedure Code(s):        --- Professional ---                           [phone], Esophagogastroduodenoscopy, flexible,                            transoral; with biopsy, single or multiple Diagnosis Code(s):        --- Professional ---                           K22.8, Other specified diseases of esophagus  K44.9, Diaphragmatic hernia without obstruction or                            gangrene                           K29.50, Unspecified chronic gastritis without                            bleeding                           D50.9, Iron deficiency anemia, unspecified CPT copyright 2019 American [employer] Association. All rights reserved. The codes documented in this report are preliminary and upon coder review may  be revised to meet current compliance requirements. Otis Brace, MD Otis Brace, MD 11/06/2019 10:22:45 AM Number of Addenda: 0

## 2019-11-06 NOTE — Op Note (Signed)
[facility] Patient Name: Patricia Rodriguez Procedure Date: 11/06/2019 MRN: [id number] Attending MD: Otis Brace , MD Date of Birth: 1966-03-05 CSN: [id number] Age: 54 Admit Type: Inpatient Procedure:                Colonoscopy Indications:              This is the patient's first colonoscopy, Iron                            deficiency anemia Providers:                Otis Brace, MD, Wynonia Sours, RN, Corie Chiquito, Technician, Glenis Smoker, CRNA Referring MD:              Medicines:                Sedation Administered by an Anesthesia Professional Complications:            No immediate complications. Estimated Blood Loss:     Estimated blood loss was minimal. Procedure:                Pre-Anesthesia Assessment:                           - Prior to the procedure, a History and Physical                            was performed, and patient medications and                            allergies were reviewed. The patient's tolerance of                            previous anesthesia was also reviewed. The risks                            and benefits of the procedure and the sedation                            options and risks were discussed with the patient.                            All questions were answered, and informed consent                            was obtained. Prior Anticoagulants: The patient has                            taken no previous anticoagulant or antiplatelet                            agents. ASA Grade Assessment: III - A patient with  severe systemic disease. After reviewing the risks                            and benefits, the patient was deemed in                            satisfactory condition to undergo the procedure.                           After obtaining informed consent, the colonoscope                            was passed under direct vision. Throughout the                             procedure, the patient's blood pressure, pulse, and                            oxygen saturations were monitored continuously. The                            [id number]-[id number] ([id number] Olympus pediatric colonscope                            was introduced through the anus and advanced to the                            the terminal ileum, with identification of the                            appendiceal orifice and IC valve. The colonoscopy                            was performed without difficulty. The patient                            tolerated the procedure well. The quality of the                            bowel preparation was good. Scope In: 10:00:08 AM Scope Out: 10:15:47 AM Scope Withdrawal Time: 0 hours 10 minutes 21 seconds  Total Procedure Duration: 0 hours 15 minutes 39 seconds  Findings:      The perianal and digital rectal examinations were normal.      The terminal ileum appeared normal.      Multiple large-mouthed diverticula were found in the entire colon.      Multiple diverticula were found in the sigmoid colon. Peri-diverticular       erythema was seen. Biopsies were taken with a cold forceps for histology.      Internal hemorrhoids were found during retroflexion. The hemorrhoids       were small. Impression:               - The examined portion of the ileum was normal.                           -  Diverticulosis in the entire examined colon.                           - Mild diverticulosis in the sigmoid colon.                            Peri-diverticular erythema was seen. Biopsied.                           - Internal hemorrhoids. Moderate Sedation:      Moderate (conscious) sedation was personally administered by an       anesthesia professional. The following parameters were monitored: oxygen       saturation, heart rate, blood pressure, and response to care. Recommendation:           - Patient has a contact number available for                             emergencies. The signs and symptoms of potential                            delayed complications were discussed with the                            patient. Return to normal activities tomorrow.                            Written discharge instructions were provided to the                            patient.                           - Resume previous diet.                           - Continue present medications.                           - Await pathology results.                           - Return to my office in 3 months. Procedure Code(s):        --- Professional ---                           [phone], Colonoscopy, flexible; with biopsy, single                            or multiple Diagnosis Code(s):        --- Professional ---                           K64.8, Other hemorrhoids                           D50.9, Iron deficiency anemia, unspecified  K57.30, Diverticulosis of large intestine without                            perforation or abscess without bleeding CPT copyright 2019 American Medical Association. All rights reserved. The codes documented in this report are preliminary and upon coder review may  be revised to meet current compliance requirements. Otis Brace, MD Otis Brace, MD 11/06/2019 10:28:35 AM Number of Addenda: 0

## 2019-11-07 ENCOUNTER — Encounter (HOSPITAL_COMMUNITY): Payer: Self-pay | Admitting: Gastroenterology

## 2019-11-07 ENCOUNTER — Other Ambulatory Visit: Payer: Self-pay

## 2019-11-07 LAB — SURGICAL PATHOLOGY

## 2019-11-22 ENCOUNTER — Other Ambulatory Visit: Payer: Self-pay | Admitting: Gastroenterology

## 2019-12-12 NOTE — Progress Notes (Signed)
Attempted to obtain medical history via telephone, unable to reach at this time. I left a voicemail to return pre surgical testing department's phone call.  

## 2019-12-15 ENCOUNTER — Other Ambulatory Visit (HOSPITAL_COMMUNITY)
Admission: RE | Admit: 2019-12-15 | Discharge: 2019-12-15 | Disposition: A | Discharge status: Home / Self Care | Payer: Medicare Other | Source: Ambulatory Visit | Attending: Gastroenterology | Admitting: Gastroenterology

## 2019-12-15 DIAGNOSIS — Z01812 Encounter for preprocedural laboratory examination: Secondary | ICD-10-CM | POA: Diagnosis present

## 2019-12-15 DIAGNOSIS — Z20822 Contact with and (suspected) exposure to covid-19: Secondary | ICD-10-CM | POA: Insufficient documentation

## 2019-12-15 LAB — SARS CORONAVIRUS 2 (TAT 6-24 HRS): SARS Coronavirus 2: NEGATIVE

## 2019-12-17 ENCOUNTER — Other Ambulatory Visit (HOSPITAL_COMMUNITY): Payer: Medicare Other

## 2019-12-19 ENCOUNTER — Ambulatory Visit (HOSPITAL_COMMUNITY): Payer: Medicare Other | Admitting: Certified Registered"

## 2019-12-19 ENCOUNTER — Ambulatory Visit (HOSPITAL_COMMUNITY)
Admission: RE | Admit: 2019-12-19 | Discharge: 2019-12-19 | Disposition: A | Discharge status: Home / Self Care | Payer: Medicare Other | Attending: Gastroenterology | Admitting: Gastroenterology

## 2019-12-19 ENCOUNTER — Encounter (HOSPITAL_COMMUNITY)
Admission: RE | Disposition: A | Discharge status: Home / Self Care | Payer: Self-pay | Source: Home / Self Care | Attending: Gastroenterology

## 2019-12-19 ENCOUNTER — Encounter (HOSPITAL_COMMUNITY): Payer: Self-pay | Admitting: Gastroenterology

## 2019-12-19 ENCOUNTER — Other Ambulatory Visit: Payer: Self-pay

## 2019-12-19 DIAGNOSIS — Z7984 Long term (current) use of oral hypoglycemic drugs: Secondary | ICD-10-CM | POA: Insufficient documentation

## 2019-12-19 DIAGNOSIS — I10 Essential (primary) hypertension: Secondary | ICD-10-CM | POA: Insufficient documentation

## 2019-12-19 DIAGNOSIS — G43909 Migraine, unspecified, not intractable, without status migrainosus: Secondary | ICD-10-CM | POA: Insufficient documentation

## 2019-12-19 DIAGNOSIS — F4310 Post-traumatic stress disorder, unspecified: Secondary | ICD-10-CM | POA: Insufficient documentation

## 2019-12-19 DIAGNOSIS — Z6841 Body Mass Index (BMI) 40.0 and over, adult: Secondary | ICD-10-CM | POA: Insufficient documentation

## 2019-12-19 DIAGNOSIS — Z833 Family history of diabetes mellitus: Secondary | ICD-10-CM | POA: Diagnosis not present

## 2019-12-19 DIAGNOSIS — Z800 Family history of malignant neoplasm of digestive organs: Secondary | ICD-10-CM | POA: Insufficient documentation

## 2019-12-19 DIAGNOSIS — Z79899 Other long term (current) drug therapy: Secondary | ICD-10-CM | POA: Insufficient documentation

## 2019-12-19 DIAGNOSIS — G4733 Obstructive sleep apnea (adult) (pediatric): Secondary | ICD-10-CM | POA: Insufficient documentation

## 2019-12-19 DIAGNOSIS — K228 Other specified diseases of esophagus: Secondary | ICD-10-CM | POA: Insufficient documentation

## 2019-12-19 DIAGNOSIS — Z5309 Procedure and treatment not carried out because of other contraindication: Secondary | ICD-10-CM | POA: Insufficient documentation

## 2019-12-19 DIAGNOSIS — K589 Irritable bowel syndrome without diarrhea: Secondary | ICD-10-CM | POA: Insufficient documentation

## 2019-12-19 DIAGNOSIS — E119 Type 2 diabetes mellitus without complications: Secondary | ICD-10-CM | POA: Insufficient documentation

## 2019-12-19 DIAGNOSIS — F319 Bipolar disorder, unspecified: Secondary | ICD-10-CM | POA: Diagnosis not present

## 2019-12-19 DIAGNOSIS — Z885 Allergy status to narcotic agent status: Secondary | ICD-10-CM | POA: Insufficient documentation

## 2019-12-19 DIAGNOSIS — F419 Anxiety disorder, unspecified: Secondary | ICD-10-CM | POA: Insufficient documentation

## 2019-12-19 DIAGNOSIS — Z8249 Family history of ischemic heart disease and other diseases of the circulatory system: Secondary | ICD-10-CM | POA: Diagnosis not present

## 2019-12-19 DIAGNOSIS — Z803 Family history of malignant neoplasm of breast: Secondary | ICD-10-CM | POA: Insufficient documentation

## 2019-12-19 DIAGNOSIS — Z880 Allergy status to penicillin: Secondary | ICD-10-CM | POA: Diagnosis not present

## 2019-12-19 HISTORY — PX: ESOPHAGOGASTRODUODENOSCOPY (EGD) WITH PROPOFOL: SHX5813

## 2019-12-19 HISTORY — PX: EUS: SHX5427

## 2019-12-19 LAB — GLUCOSE, CAPILLARY: Glucose-Capillary: 180 mg/dL — ABNORMAL HIGH (ref 70–99)

## 2019-12-19 SURGERY — ESOPHAGOGASTRODUODENOSCOPY (EGD) WITH PROPOFOL
Anesthesia: Monitor Anesthesia Care

## 2019-12-19 MED ORDER — FENTANYL CITRATE (PF) 100 MCG/2ML IJ SOLN
INTRAMUSCULAR | Status: AC
  Filled 2019-12-19: qty 2

## 2019-12-19 MED ORDER — LIDOCAINE HCL (CARDIAC) PF 100 MG/5ML IV SOSY
PREFILLED_SYRINGE | INTRAVENOUS | Status: DC | PRN
  Administered 2019-12-19: 80 mg via INTRAVENOUS

## 2019-12-19 MED ORDER — BUTAMBEN-TETRACAINE-BENZOCAINE 2-2-14 % EX AERO
INHALATION_SPRAY | CUTANEOUS | Status: DC | PRN
  Administered 2019-12-19: 1 via TOPICAL

## 2019-12-19 MED ORDER — SODIUM CHLORIDE 0.9 % IV SOLN: INTRAVENOUS | Status: DC

## 2019-12-19 MED ORDER — PROPOFOL 10 MG/ML IV BOLUS
INTRAVENOUS | Status: AC
  Filled 2019-12-19: qty 20

## 2019-12-19 MED ORDER — GLYCOPYRROLATE 0.2 MG/ML IJ SOLN
INTRAMUSCULAR | Status: DC | PRN
  Administered 2019-12-19 (×2): .1 mg via INTRAVENOUS

## 2019-12-19 MED ORDER — LACTATED RINGERS IV SOLN: INTRAVENOUS | Status: DC

## 2019-12-19 MED ORDER — DEXMEDETOMIDINE (PRECEDEX) IN NS 20 MCG/5ML (4 MCG/ML) IV SYRINGE
PREFILLED_SYRINGE | INTRAVENOUS | Status: DC | PRN
  Administered 2019-12-19 (×3): 4 ug via INTRAVENOUS

## 2019-12-19 MED ORDER — PROPOFOL 500 MG/50ML IV EMUL
INTRAVENOUS | Status: DC | PRN
  Administered 2019-12-19: 125 ug/kg/min via INTRAVENOUS

## 2019-12-19 SURGICAL SUPPLY — 15 items

## 2019-12-19 NOTE — Op Note (Signed)
[facility] Patient Name: Patricia Rodriguez Procedure Date: 12/19/2019 MRN: [id number] Attending MD: Arta Silence , MD Date of Birth: Nov 02, 1965 CSN: [id number] Age: 54 Admit Type: Inpatient Procedure:                Upper EUS Indications:              Esophageal deformity on endoscopy/Subepithelial                            tumor vs. extrinsic compression Providers:                Arta Silence, MD, Cleda Daub, RN, Elspeth Cho Tech., Technician, Ladona Ridgel,                            Technician Referring MD:             Dr. Otis Brace Medicines:                Monitored Anesthesia Care Complications:            No immediate complications. Estimated Blood Loss:     Estimated blood loss: none. Procedure:                Pre-Anesthesia Assessment:                           - Prior to the procedure, a History and Physical                            was performed, and patient medications and                            allergies were reviewed. The patient's tolerance of                            previous anesthesia was also reviewed. The risks                            and benefits of the procedure and the sedation                            options and risks were discussed with the patient.                            All questions were answered, and informed consent                            was obtained. Prior Anticoagulants: The patient has                            taken no previous anticoagulant or antiplatelet                            agents. ASA Grade Assessment: III -  A patient with                            severe systemic disease. After reviewing the risks                            and benefits, the patient was deemed in                            satisfactory condition to undergo the procedure.                           After obtaining informed consent, the endoscope was                            passed  under direct vision. Throughout the                            procedure, the patient's blood pressure, pulse, and                            oxygen saturations were monitored continuously. The                            [id number]-[id number] ([id number]) Olympus Radial EUS was                            introduced through the mouth, and advanced to the                            fundus of the stomach. The upper EUS was                            accomplished without difficulty. The patient                            tolerated the procedure well. Scope In: Scope Out: Findings:      ENDOSCOPIC FINDING: :      A single small submucosal nodule with a localized distribution was found       in the proximal esophagus, 20 cm from the incisors. Lesion yellow and       ballotable.      ENDOSONOGRAPHIC FINDING: :      An oval intramural (subepithelial) lesion was found in the upper third       of the esophagus. It was encountered at 20 cm from the incisors. The       lesion was heterogenous, with "salt and pepper" type echotexture.       Sonographically, the origin appeared to be within the deep mucosa (Layer       2). The lesion also appeared to involve the following wall layer(s):       submucosa (Layer 3). The endosonographic borders were well-defined.       Dimension of lesion approximately 23mm x 45mm. Impression:               - Submucosal nodule found in the esophagus.                           -  An intramural (subepithelial) lesion was found in                            the upper third of the esophagus. It appeared to                            originate from within the deep mucosa (Layer 2).                            Tissue has not been obtained. 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 Moderate Sedation:      None Recommendation:           - Discharge patient to home (via wheelchair).                           - Resume previous diet today.                           - Continue present medications.                           - Return to GI clinic (Dr. Alessandra Bevels) as                            previously scheduled.                           - No further surveillance or monitoring of                            patient's esophageal nodule is necessary, in                            absence of new/interval symptoms. Procedure Code(s):        --- Professional ---                           [phone], 77, Esophagogastroduodenoscopy, flexible,                            transoral; with endoscopic ultrasound examination  limited to the esophagus, stomach or duodenum, and                            adjacent structures Diagnosis Code(s):        --- Professional ---                           K22.8, Other specified diseases of esophagus CPT copyright 2019 American Medical Association. All rights reserved. The codes documented in this report are preliminary and upon coder review may  be revised to meet current compliance requirements. Arta Silence, MD 12/19/2019 9:18:36 AM This report has been signed electronically. Number of Addenda: 0

## 2019-12-19 NOTE — Anesthesia Preprocedure Evaluation (Addendum)
Anesthesia Evaluation  Patient identified by MRN, date of birth, ID band Patient awake    Reviewed: Allergy & Precautions, NPO status , Patient's Chart, lab work & pertinent test results  Airway Mallampati: II  TM Distance: >3 FB Neck ROM: Full    Dental  (+) Missing   Pulmonary neg pulmonary ROS,    Pulmonary exam normal breath sounds clear to auscultation       Cardiovascular hypertension, Pt. on medications Normal cardiovascular exam Rhythm:Regular Rate:Normal     Neuro/Psych  Headaches, PSYCHIATRIC DISORDERS Anxiety Depression Bipolar Disorder    GI/Hepatic Neg liver ROS, submucosal nodule   Endo/Other  diabetes, Type 2, Oral Hypoglycemic AgentsMorbid obesity  Renal/GU negative Renal ROS     Musculoskeletal negative musculoskeletal ROS (+)   Abdominal   Peds  Hematology negative hematology ROS (+)   Anesthesia Other Findings Day of surgery medications reviewed with the patient.  Reproductive/Obstetrics                            Anesthesia Physical Anesthesia Plan  ASA: III  Anesthesia Plan: MAC   Post-op Pain Management:    Induction: Intravenous  PONV Risk Score and Plan: 2 and Propofol infusion and Treatment may vary due to age or medical condition  Airway Management Planned: Nasal Cannula and Natural Airway  Additional Equipment:   Intra-op Plan:   Post-operative Plan:   Informed Consent: I have reviewed the patients History and Physical, chart, labs and discussed the procedure including the risks, benefits and alternatives for the proposed anesthesia with the patient or authorized representative who has indicated his/her understanding and acceptance.       Plan Discussed with: CRNA and Anesthesiologist  Anesthesia Plan Comments:         Anesthesia Quick Evaluation

## 2019-12-19 NOTE — Discharge Instructions (Signed)

## 2019-12-19 NOTE — Anesthesia Postprocedure Evaluation (Signed)
Anesthesia Post Note  Patient: Patricia Rodriguez  Procedure(s) Performed: ESOPHAGOGASTRODUODENOSCOPY (EGD) WITH PROPOFOL (N/A ) UPPER ENDOSCOPIC ULTRASOUND (EUS) RADIAL (N/A )     Patient location during evaluation: Endoscopy Anesthesia Type: MAC Level of consciousness: awake and alert Pain management: pain level controlled Vital Signs Assessment: post-procedure vital signs reviewed and stable Respiratory status: spontaneous breathing, nonlabored ventilation, respiratory function stable and patient connected to nasal cannula oxygen Cardiovascular status: stable and blood pressure returned to baseline Postop Assessment: no apparent nausea or vomiting Anesthetic complications: no   No complications documented.  Last Vitals:  Vitals:   12/19/19 0932 12/19/19 0933  BP: (!) 143/97   Pulse: 98 89  Resp: 18 18  Temp:    SpO2: 93% 95%    Last Pain:  Vitals:   12/19/19 0932  TempSrc:   PainSc: 0-No pain                 Catalina Gravel

## 2019-12-19 NOTE — Transfer of Care (Signed)
Immediate Anesthesia Transfer of Care Note  Patient: Patricia Rodriguez  Procedure(s) Performed: ESOPHAGOGASTRODUODENOSCOPY (EGD) WITH PROPOFOL (N/A ) UPPER ENDOSCOPIC ULTRASOUND (EUS) RADIAL (N/A )  Patient Location: PACU  Anesthesia Type:MAC  Level of Consciousness: awake, alert  and oriented  Airway & Oxygen Therapy: Patient Spontanous Breathing and Patient connected to nasal cannula oxygen  Post-op Assessment: Report given to RN and Post -op Vital signs reviewed and stable  Post vital signs: Reviewed and stable  Last Vitals:  Vitals Value Taken Time  BP 116/78 12/19/19 0914  Temp 36.6 C 12/19/19 0914  Pulse 106 12/19/19 0915  Resp 27 12/19/19 0915  SpO2 92 % 12/19/19 0915  Vitals shown include unvalidated device data.  Last Pain:  Vitals:   12/19/19 0914  TempSrc: Oral  PainSc:          Complications: No complications documented.

## 2019-12-19 NOTE — H&P (Signed)
Patient interval history reviewed.  Patient examined again.  There has been no change from documented H/P scanned into chart from our office except as documented below.  Assessment:  1.  Esophageal nodule.  Plan:  1.  Upper endoscopy including ultrasound (EGD + EUS). 2.  Risks (bleeding, infection, bowel perforation that could require surgery, sedation-related changes in cardiopulmonary systems), benefits (identification and possible treatment of source of symptoms, exclusion of certain causes of symptoms), and alternatives (watchful waiting, radiographic imaging studies, empiric medical treatment) of upper endoscopy and upper endoscopy with ultrasound and possible fine needle aspiration biopsies (EGD + EUS +/- FNA) were explained to patient/family in detail and patient wishes to proceed.

## 2019-12-23 ENCOUNTER — Encounter (HOSPITAL_COMMUNITY): Payer: Self-pay | Admitting: Gastroenterology

## 2021-10-21 ENCOUNTER — Encounter (HOSPITAL_BASED_OUTPATIENT_CLINIC_OR_DEPARTMENT_OTHER): Payer: Self-pay | Admitting: Emergency Medicine

## 2021-10-21 ENCOUNTER — Other Ambulatory Visit (HOSPITAL_BASED_OUTPATIENT_CLINIC_OR_DEPARTMENT_OTHER): Payer: Self-pay

## 2021-10-21 ENCOUNTER — Other Ambulatory Visit: Payer: Self-pay

## 2021-10-21 ENCOUNTER — Emergency Department (HOSPITAL_BASED_OUTPATIENT_CLINIC_OR_DEPARTMENT_OTHER)
Admission: EM | Admit: 2021-10-21 | Discharge: 2021-10-21 | Disposition: A | Discharge status: Home / Self Care | Payer: Medicare Other | Attending: Emergency Medicine | Admitting: Emergency Medicine

## 2021-10-21 ENCOUNTER — Emergency Department (HOSPITAL_BASED_OUTPATIENT_CLINIC_OR_DEPARTMENT_OTHER): Payer: Medicare Other | Admitting: Radiology

## 2021-10-21 DIAGNOSIS — S8261XA Displaced fracture of lateral malleolus of right fibula, initial encounter for closed fracture: Secondary | ICD-10-CM | POA: Diagnosis not present

## 2021-10-21 DIAGNOSIS — M25562 Pain in left knee: Secondary | ICD-10-CM | POA: Insufficient documentation

## 2021-10-21 DIAGNOSIS — Z7984 Long term (current) use of oral hypoglycemic drugs: Secondary | ICD-10-CM | POA: Diagnosis not present

## 2021-10-21 DIAGNOSIS — X501XXA Overexertion from prolonged static or awkward postures, initial encounter: Secondary | ICD-10-CM | POA: Diagnosis not present

## 2021-10-21 DIAGNOSIS — Z79899 Other long term (current) drug therapy: Secondary | ICD-10-CM | POA: Diagnosis not present

## 2021-10-21 DIAGNOSIS — Y9301 Activity, walking, marching and hiking: Secondary | ICD-10-CM | POA: Diagnosis not present

## 2021-10-21 DIAGNOSIS — E119 Type 2 diabetes mellitus without complications: Secondary | ICD-10-CM | POA: Diagnosis not present

## 2021-10-21 DIAGNOSIS — I10 Essential (primary) hypertension: Secondary | ICD-10-CM | POA: Diagnosis not present

## 2021-10-21 DIAGNOSIS — S99911A Unspecified injury of right ankle, initial encounter: Secondary | ICD-10-CM | POA: Diagnosis present

## 2021-10-21 MED ORDER — IBUPROFEN 600 MG PO TABS
600.0000 mg | ORAL_TABLET | Freq: Four times a day (QID) | ORAL | 0 refills | Status: AC | PRN
  Filled 2021-10-21: qty 30, 8d supply, fill #0

## 2021-10-21 MED ORDER — IBUPROFEN 800 MG PO TABS
800.0000 mg | ORAL_TABLET | Freq: Once | ORAL | Status: AC
  Administered 2021-10-21: 800 mg via ORAL
  Filled 2021-10-21: qty 1

## 2021-10-21 MED ORDER — CYCLOBENZAPRINE HCL 10 MG PO TABS
10.0000 mg | ORAL_TABLET | Freq: Two times a day (BID) | ORAL | 0 refills | Status: AC | PRN
  Filled 2021-10-21: qty 20, 10d supply, fill #0

## 2021-10-21 NOTE — ED Triage Notes (Signed)
Pt arrives to ED with c/o fall yesterday after twisting her right ankle. She also reports left knee pain.

## 2021-10-21 NOTE — ED Notes (Signed)
Pt d/c home with mother per MD order.Discharge summary reviewed, pt verbalizes understanding. Off unit via WC- No s/s of acute distress noted at discharge.

## 2021-10-21 NOTE — ED Provider Notes (Signed)
[facility] EMERGENCY DEPT Provider Note   CSN: [id number] Arrival date & time: 10/21/21  1015     History  Chief Complaint  Patient presents with   Fall   Ankle Pain    Patricia Rodriguez is a 56 y.o. female.  The history is provided by the patient. No language interpreter was used.  Fall  Ankle Pain   56 year old female significant history of obesity, diabetes, hypertension, presenting for evaluation of a fall.  Patient reports yesterday she was carrying food out to her car and when she was walking on uneven pavers she twisted her right ankle and fell.  She did not hit her head nor experienced any loss of consciousness but she did experiencing pain about her right ankle and left knee.  Pain is sharp throbbing nonradiating worse with ambulation.  She noted swelling to her right ankle.  She tries using ice but noticed the swelling still persist.  She denies any back pain and denies any numbness.  Home Medications Prior to Admission medications   Medication Sig Start Date End Date Taking? Authorizing Provider  ARIPiprazole (ABILIFY) 15 MG tablet Take 15 mg by mouth every evening.     [provider]  atorvastatin (LIPITOR) 20 MG tablet Take 20 mg by mouth every evening.     [provider]  benztropine (COGENTIN) 1 MG tablet Take 1 mg by mouth at bedtime.     [provider]  busPIRone (BUSPAR) 10 MG tablet Take 10 mg by mouth 3 (three) times daily.    [provider]  gabapentin (NEURONTIN) 100 MG capsule Take 100 mg by mouth 3 (three) times daily.    [provider]  hydrochlorothiazide (HYDRODIURIL) 25 MG tablet Take 25 mg by mouth daily.    [provider]  metFORMIN (GLUCOPHAGE) 500 MG tablet Take 1 tablet (500 mg total) by mouth daily with breakfast. For high blood sugars Patient taking differently: Take 1,000 mg by mouth 2 (two) times daily with a meal. For high blood sugars 05/12/18   Connye Burkitt, NP   prazosin (MINIPRESS) 1 MG capsule Take 1 mg by mouth at bedtime.    [provider]  triamcinolone cream (KENALOG) 0.5 % Apply 1 application topically 2 (two) times daily as needed (eczema).  09/28/19   [provider]  vortioxetine HBr (TRINTELLIX) 20 MG TABS tablet Take 20 mg by mouth daily.    [provider]      Allergies    Hydrocodone-acetaminophen, Oxycodone, and Penicillins    Review of Systems   Review of Systems  All other systems reviewed and are negative.   Physical Exam Updated Vital Signs BP (!) 149/86   Pulse 84   Temp 99.3 F (37.4 C) (Oral)   Resp 18   Ht 5' (1.524 m)   Wt 96.6 kg   SpO2 100%   BMI 41.60 kg/m  Physical Exam Vitals and nursing note reviewed.  Constitutional:      General: She is not in acute distress.    Appearance: She is well-developed.  HENT:     Head: Atraumatic.  Eyes:     Conjunctiva/sclera: Conjunctivae normal.  Pulmonary:     Effort: Pulmonary effort is normal.  Musculoskeletal:        General: Tenderness (Right ankle: Moderate edema noted to lateral malleoli region with tenderness to palpation but no crepitus noted.  Able to range ankle.  Dorsalis pedis pulse palpable, no pain to fifth metatarsal region) present.  Cervical back: Neck supple.     Comments: Left knee: Tenderness about the anterior knee with normal knee flexion and extension and no swelling or deformity noted.  No foreign body noted.  No laceration noted.  Skin:    Findings: No rash.  Neurological:     Mental Status: She is alert.  Psychiatric:        Mood and Affect: Mood normal.     ED Results / Procedures / Treatments   Labs (all labs ordered are listed, but only abnormal results are displayed) Labs Reviewed - No data to display  EKG None  Radiology DG Ankle Complete Right  Result Date: 10/21/2021 CLINICAL DATA:  fall, pain EXAM: RIGHT ANKLE - COMPLETE 3+ VIEW; LEFT KNEE - COMPLETE 4+ VIEW COMPARISON:  Ankle radiograph  03/28/2015 FINDINGS: Right ankle: There is ankle soft tissue swelling, most prominent laterally. There is a small nondisplaced avulsion fracture of the lateral malleolus. No evidence of medial malleolar or posterior malleolar fracture. There is similar bony spurring along the lateral talus and lateral malleolus. Left knee: There is no evidence of acute fracture. There is tricompartment osteophyte formation with mild medial compartment joint space narrowing. There is a small knee effusion. There is a small metallic density measuring 3 mm overlying the femoral diaphysis, only seen on frontal and oblique views. Additional tiny metallic density overlying the lateral mid thigh soft tissues. IMPRESSION: RIGHT ANKLE: Nondisplaced avulsion fracture of the lateral malleolus with ankle soft tissue swelling. LEFT KNEE: No evidence of acute fracture.  Small left knee joint effusion. Mild tricompartment osteoarthritis. Small metallic density measuring 3 mm overlying the femoral diaphysis and additional tiny metallic density overlying the lateral mid thigh soft tissues, potentially foreign bodies. Electronically Signed   By: Maurine Simmering M.D.   On: 10/21/2021 11:13   DG Knee Complete 4 Views Left  Result Date: 10/21/2021 CLINICAL DATA:  fall, pain EXAM: RIGHT ANKLE - COMPLETE 3+ VIEW; LEFT KNEE - COMPLETE 4+ VIEW COMPARISON:  Ankle radiograph 03/28/2015 FINDINGS: Right ankle: There is ankle soft tissue swelling, most prominent laterally. There is a small nondisplaced avulsion fracture of the lateral malleolus. No evidence of medial malleolar or posterior malleolar fracture. There is similar bony spurring along the lateral talus and lateral malleolus. Left knee: There is no evidence of acute fracture. There is tricompartment osteophyte formation with mild medial compartment joint space narrowing. There is a small knee effusion. There is a small metallic density measuring 3 mm overlying the femoral diaphysis, only seen on frontal  and oblique views. Additional tiny metallic density overlying the lateral mid thigh soft tissues. IMPRESSION: RIGHT ANKLE: Nondisplaced avulsion fracture of the lateral malleolus with ankle soft tissue swelling. LEFT KNEE: No evidence of acute fracture.  Small left knee joint effusion. Mild tricompartment osteoarthritis. Small metallic density measuring 3 mm overlying the femoral diaphysis and additional tiny metallic density overlying the lateral mid thigh soft tissues, potentially foreign bodies. Electronically Signed   By: Maurine Simmering M.D.   On: 10/21/2021 11:13    Procedures Procedures    Medications Ordered in ED Medications  ibuprofen (ADVIL) tablet 800 mg (has no administration in time range)    ED Course/ Medical Decision Making/ A&P                           Medical Decision Making Amount and/or Complexity of Data Reviewed Radiology: ordered.   BP (!) 149/86   Pulse 84  Temp 99.3 F (37.4 C) (Oral)   Resp 18   Ht 5' (1.524 m)   Wt 96.6 kg   SpO2 100%   BMI 41.60 kg/m   12:28 PM This is a 56 year old female presenting for evaluation of a fall.  She had a mechanical fall when she stepped on uneven pavement yesterday and twisted her right ankle and fell to the ground.  She complains of pain to the right ankle and her left knee but able to ambulate.  She did tried using ice at home without adequate relief.  On exam, she does have edema noted to her right ankle at the lateral malleoli region.  No laceration no foreign body noted.  Left knee is fairly mobile and no significant point tenderness or foreign body noted.  She is neurovascular intact.  X-ray of the left knee and right ankle was independently visualized and interpreted by me and I agree with radiologist interpretation.  X-ray of the right ankle demonstrate a nondisplaced avulsion fracture of the lateral malleolus with ankle soft tissue swelling.  This finding is consistent with patient's presentation.  Left knee shows a  small metallic density noted to the femoral diaphysis suggestive of potential foreign bodies.  However on exam patient does not have any evidence of penetrating wound no foreign body therefore this is likely an incidental finding.  Plan to place patient in a cam walker boot for the right ankle for stability and aid with mobilization.  Patient given ibuprofen for pain with improvement of symptoms.  I felt patient can be weightbearing with this injury and she may follow-up with orthopedic outpatient as needed.  Will provide anti-inflammatory medication and muscle relaxant for symptom control, RICE therapy discussed.        Final Clinical Impression(s) / ED Diagnoses Final diagnoses:  Avulsion fracture of lateral malleolus of right fibula, closed, initial encounter    Rx / DC Orders ED Discharge Orders          Ordered    ibuprofen (ADVIL) 600 MG tablet  Every 6 hours PRN        10/21/21 1234    cyclobenzaprine (FLEXERIL) 10 MG tablet  2 times daily PRN        10/21/21 1234              Domenic Moras, PA-C 10/21/21 1236    Charlesetta Shanks, MD 11/05/21 [phone]

## 2022-01-14 ENCOUNTER — Encounter (HOSPITAL_COMMUNITY): Payer: Self-pay

## 2022-01-14 ENCOUNTER — Emergency Department (HOSPITAL_COMMUNITY)
Admission: EM | Admit: 2022-01-14 | Discharge: 2022-01-14 | Disposition: A | Discharge status: Home / Self Care | Payer: Medicare Other | Attending: Emergency Medicine | Admitting: Emergency Medicine

## 2022-01-14 ENCOUNTER — Emergency Department (HOSPITAL_COMMUNITY): Payer: Medicare Other

## 2022-01-14 DIAGNOSIS — Y9389 Activity, other specified: Secondary | ICD-10-CM | POA: Diagnosis not present

## 2022-01-14 DIAGNOSIS — W19XXXA Unspecified fall, initial encounter: Secondary | ICD-10-CM | POA: Insufficient documentation

## 2022-01-14 DIAGNOSIS — R55 Syncope and collapse: Secondary | ICD-10-CM | POA: Insufficient documentation

## 2022-01-14 DIAGNOSIS — M25562 Pain in left knee: Secondary | ICD-10-CM | POA: Insufficient documentation

## 2022-01-14 DIAGNOSIS — S82839A Other fracture of upper and lower end of unspecified fibula, initial encounter for closed fracture: Secondary | ICD-10-CM

## 2022-01-14 DIAGNOSIS — S8262XA Displaced fracture of lateral malleolus of left fibula, initial encounter for closed fracture: Secondary | ICD-10-CM | POA: Diagnosis not present

## 2022-01-14 DIAGNOSIS — S99912A Unspecified injury of left ankle, initial encounter: Secondary | ICD-10-CM | POA: Diagnosis present

## 2022-01-14 LAB — I-STAT CHEM 8, ED
BUN: 6 mg/dL (ref 6–20)
Calcium, Ion: 1.09 mmol/L — ABNORMAL LOW (ref 1.15–1.40)
Chloride: 99 mmol/L (ref 98–111)
Creatinine, Ser: 0.8 mg/dL (ref 0.44–1.00)
Glucose, Bld: 269 mg/dL — ABNORMAL HIGH (ref 70–99)
HCT: 43 % (ref 36.0–46.0)
Hemoglobin: 14.6 g/dL (ref 12.0–15.0)
Potassium: 3.6 mmol/L (ref 3.5–5.1)
Sodium: 138 mmol/L (ref 135–145)
TCO2: 26 mmol/L (ref 22–32)

## 2022-01-14 LAB — CBC WITH DIFFERENTIAL/PLATELET
Abs Immature Granulocytes: 0.03 10*3/uL (ref 0.00–0.07)
Basophils Absolute: 0 10*3/uL (ref 0.0–0.1)
Basophils Relative: 0 %
Eosinophils Absolute: 0.1 10*3/uL (ref 0.0–0.5)
Eosinophils Relative: 1 %
HCT: 42.8 % (ref 36.0–46.0)
Hemoglobin: 13.7 g/dL (ref 12.0–15.0)
Immature Granulocytes: 0 %
Lymphocytes Relative: 21 %
Lymphs Abs: 2.1 10*3/uL (ref 0.7–4.0)
MCH: 27.7 pg (ref 26.0–34.0)
MCHC: 32 g/dL (ref 30.0–36.0)
MCV: 86.5 fL (ref 80.0–100.0)
Monocytes Absolute: 0.5 10*3/uL (ref 0.1–1.0)
Monocytes Relative: 5 %
Neutro Abs: 7.3 10*3/uL (ref 1.7–7.7)
Neutrophils Relative %: 73 %
Platelets: 373 10*3/uL (ref 150–400)
RBC: 4.95 MIL/uL (ref 3.87–5.11)
RDW: 14.7 % (ref 11.5–15.5)
WBC: 10.1 10*3/uL (ref 4.0–10.5)
nRBC: 0 % (ref 0.0–0.2)

## 2022-01-14 NOTE — ED Provider Notes (Signed)
Patricia Rodriguez Patricia Rodriguez   CSN: [id number] Arrival date & time: 01/14/22  0813     History  Chief Complaint  Patient presents with   Fall   Ankle Pain    Patricia Rodriguez is a 56 y.o. female.  56 yo F with a chief complaints of a syncopal event.  This happened yesterday.  She had donated plasma and not had anything to eat or drink beforehand.  She now feels better from that side of things.  Denies chest pain difficulty breathing headache or neck pain.  She fell and hurt her left ankle.  She actually hurt her right ankle about a month or so ago.  She was using her mother's cane for support.  Has been up to ambulate but with pain.  Has had some mild pain to the left knee but think she just bruised it.   Fall  Ankle Pain      Home Medications Prior to Admission medications   Medication Sig Start Date End Date Taking? Authorizing Provider  ARIPiprazole (ABILIFY) 15 MG tablet Take 15 mg by mouth every evening.     [provider]  atorvastatin (LIPITOR) 20 MG tablet Take 20 mg by mouth every evening.     [provider]  benztropine (COGENTIN) 1 MG tablet Take 1 mg by mouth at bedtime.     [provider]  busPIRone (BUSPAR) 10 MG tablet Take 10 mg by mouth 3 (three) times daily.    [provider]  cyclobenzaprine (FLEXERIL) 10 MG tablet Take 1 tablet (10 mg total) by mouth 2 (two) times daily as needed for muscle spasms. 10/21/21   Domenic Moras, PA-C  gabapentin (NEURONTIN) 100 MG capsule Take 100 mg by mouth 3 (three) times daily.    [provider]  hydrochlorothiazide (HYDRODIURIL) 25 MG tablet Take 25 mg by mouth daily.    [provider]  ibuprofen (ADVIL) 600 MG tablet Take 1 tablet (600 mg total) by mouth every 6 (six) hours as needed. 10/21/21   Domenic Moras, PA-C  metFORMIN (GLUCOPHAGE) 500 MG tablet Take 1 tablet (500 mg total) by mouth daily with breakfast. For high blood  sugars Patient taking differently: Take 1,000 mg by mouth 2 (two) times daily with a meal. For high blood sugars 05/12/18   Connye Burkitt, NP  prazosin (MINIPRESS) 1 MG capsule Take 1 mg by mouth at bedtime.    [provider]  triamcinolone cream (KENALOG) 0.5 % Apply 1 application topically 2 (two) times daily as needed (eczema).  09/28/19   [provider]  vortioxetine HBr (TRINTELLIX) 20 MG TABS tablet Take 20 mg by mouth daily.    [provider]      Allergies    Hydrocodone-acetaminophen, Oxycodone, and Penicillins    Review of Systems   Review of Systems  Physical Exam Updated Vital Signs BP (!) 171/76   Pulse 90   Temp 98.8 F (37.1 C)   Resp 18   Ht 5' (1.524 m)   Wt 98.4 kg   SpO2 99%   BMI 42.38 kg/m  Physical Exam Vitals and nursing Rodriguez reviewed.  Constitutional:      General: She is not in acute distress.    Appearance: She is well-developed. She is not diaphoretic.  HENT:     Head: Normocephalic and atraumatic.  Eyes:     Pupils: Pupils are equal, round, and reactive to light.  Cardiovascular:     Rate  and Rhythm: Normal rate and regular rhythm.     Heart sounds: No murmur heard.    No friction rub. No gallop.  Pulmonary:     Effort: Pulmonary effort is normal.     Breath sounds: No wheezing or rales.  Abdominal:     General: There is no distension.     Palpations: Abdomen is soft.     Tenderness: There is no abdominal tenderness.  Musculoskeletal:        General: Tenderness present.     Cervical back: Normal range of motion and neck supple.     Comments: Pain along the distal aspect of the fibula.  No pain along the fibular neck.  Pulse motor and sensation intact.  No pain along the base of the fifth metatarsal or the navicular.  Edema to the lateral aspect of the ankle.  Skin:    General: Skin is warm and dry.  Neurological:     Mental Status: She is alert and oriented to person, place, and time.  Psychiatric:         Behavior: Behavior normal.     ED Results / Procedures / Treatments   Labs (all labs ordered are listed, but only abnormal results are displayed) Labs Reviewed  I-STAT CHEM 8, ED - Abnormal; Notable for the following components:      Result Value   Glucose, Bld 269 (*)    Calcium, Ion 1.09 (*)    All other components within normal limits  CBC WITH DIFFERENTIAL/PLATELET    EKG None  Radiology DG Ankle Complete Left  Result Date: 01/14/2022 CLINICAL DATA:  Fall, injury to ankle. EXAM: LEFT ANKLE COMPLETE - 3+ VIEW COMPARISON:  None Available. FINDINGS: Slightly displaced fracture at the posterior-lateral aspect of the lateral malleolus. Distal tibia appears intact and normally aligned. Ankle mortise is symmetric. Visualized portions of the hindfoot and midfoot appear intact and normally aligned. Soft tissue swelling is present over the lateral malleolus. IMPRESSION: Slightly displaced fracture at the posterior-lateral aspect of the lateral malleolus. Electronically Signed   By: Franki Cabot M.D.   On: 01/14/2022 08:49    Procedures Procedures    Medications Ordered in ED Medications - No data to display  ED Course/ Medical Decision Making/ A&P                           Medical Decision Making  56 yo F with a cc of left ankle pain after a syncopal event that occurred yesterday.  Plain film independently interpreted by me with a distal fibular fracture.  Weber C.  Patient is not totally stable at baseline.  We will give her a cam walker.  Orthopedic follow-up.  Patient had blood work due to syncopal event yesterday, no significant anemia no significant electrolyte abnormality.  11:49 AM:  I have discussed the diagnosis/risks/treatment options with the patient.  Evaluation and diagnostic testing in the emergency department does not suggest an emergent condition requiring admission or immediate intervention beyond what has been performed at this time.  They will follow up with  Ortho. We also discussed returning to the ED immediately if new or worsening sx occur. We discussed the sx which are most concerning (e.g., sudden worsening pain, fever, inability to tolerate by mouth) that necessitate immediate return. Medications administered to the patient during their visit and any new prescriptions provided to the patient are listed below.  Medications given during this visit Medications - No data  to display   The patient appears reasonably screen and/or stabilized for discharge and I doubt any other medical condition or other [facility] requiring further screening, evaluation, or treatment in the ED at this time prior to discharge.          Final Clinical Impression(s) / ED Diagnoses Final diagnoses:  Avulsion fracture of distal fibula    Rx / DC Orders ED Discharge Orders     None         Deno Etienne, DO 01/14/22 1149

## 2022-01-14 NOTE — Discharge Instructions (Signed)
Try to keep your weight off the ankle as best you can.  Follow-up with the orthopedist in the office.  Take 3 over the counter ibuprofen tablets 3 times a day or your prescribed ibuprofen.  Take this with a meal Also take tylenol '1000mg'$ (2 extra strength) four times a day.

## 2022-01-14 NOTE — ED Provider Triage Note (Signed)
Emergency Medicine Provider Triage Evaluation Note  Patricia Rodriguez , a 56 y.o. female  was evaluated in triage.  Pt complains of ankle injury. Pt normally donated plasma twice weekly.  Donated plasma yesterday but did not eat. Went home and report she was walking to the bathroom and passed out, fell injured her L ankle. Report L ankle painful and swollen, no other complaint  Review of Systems  Positive: As above Negative: As above  Physical Exam  BP (!) 208/118 (BP Location: Left Arm)   Pulse (!) 115   Temp 98.8 F (37.1 C)   Resp 16   Ht 5' (1.524 m)   Wt 98.4 kg   SpO2 98%   BMI 42.38 kg/m  Gen:   Awake, no distress   Resp:  Normal effort  MSK:   Moves extremities without difficulty   Other:    Medical Decision Making  Medically screening exam initiated at 8:33 AM.  Appropriate orders placed.  NITI LEISURE was informed that the remainder of the evaluation will be completed by another provider, this initial triage assessment does not replace that evaluation, and the importance of remaining in the ED until their evaluation is complete.     Domenic Moras, PA-C 01/14/22 [phone]

## 2022-01-14 NOTE — ED Notes (Signed)
An After Visit Summary was printed and given to the patient. Discharge instructions given and no further questions at this time.  

## 2022-01-14 NOTE — ED Triage Notes (Signed)
Pt states that yesterday she was donating plasma and afterwards felt dizzy and fell to the ground, injuring her L ankle.

## 2022-02-18 ENCOUNTER — Other Ambulatory Visit: Payer: Self-pay | Admitting: Adult Medicine

## 2022-02-18 DIAGNOSIS — Z1231 Encounter for screening mammogram for malignant neoplasm of breast: Secondary | ICD-10-CM

## 2022-02-26 ENCOUNTER — Ambulatory Visit: Payer: Managed Care, Other (non HMO)

## 2022-04-21 ENCOUNTER — Ambulatory Visit: Payer: Managed Care, Other (non HMO)

## 2022-06-21 ENCOUNTER — Ambulatory Visit: Payer: Medicare PPO | Admitting: Podiatry

## 2022-07-27 ENCOUNTER — Ambulatory Visit (INDEPENDENT_AMBULATORY_CARE_PROVIDER_SITE_OTHER): Payer: Medicare PPO | Admitting: Podiatry

## 2022-07-27 ENCOUNTER — Encounter: Payer: Self-pay | Admitting: Podiatry

## 2022-07-27 DIAGNOSIS — B351 Tinea unguium: Secondary | ICD-10-CM | POA: Diagnosis not present

## 2022-07-27 DIAGNOSIS — E119 Type 2 diabetes mellitus without complications: Secondary | ICD-10-CM | POA: Insufficient documentation

## 2022-07-27 NOTE — Addendum Note (Signed)
Addended by: Helane Gunther on: 07/27/2022 03:11 PM   Modules accepted: Level of Service

## 2022-07-27 NOTE — Progress Notes (Addendum)
This patient returns to my office for at risk foot care.  This patient requires this care by a professional since this patient will be at risk due to having diabetes. This patient is unable to cut nails herself since the patient cannot reach her nails.These nails are painful walking and wearing shoes.  This patient presents for at risk foot care today.  General Appearance  Alert, conversant and in no acute stress.  Vascular  Dorsalis pedis and posterior tibial  pulses are palpable  bilaterally.  Capillary return is within normal limits  bilaterally. Temperature is within normal limits  bilaterally.  Neurologic  Senn-Weinstein monofilament wire test within normal limits  bilaterally. Muscle power within normal limits bilaterally.  Nails Thick disfigured discolored nails with subungual debris  from hallux to fifth toes bilaterally. No evidence of bacterial infection or drainage bilaterally.  Orthopedic  No limitations of motion  feet .  No crepitus or effusions noted.  No bony pathology or digital deformities noted. Minimal HAV  B/L.  Skin  normotropic skin with no porokeratosis noted bilaterally.  No signs of infections or ulcers noted.     Onychomycosis  Pain in right toes  Pain in left toes  Consent was obtained for treatment procedures.   Mechanical debridement of nails 1-5  bilaterally performed with a nail nipper.  Filed with dremel without incident. Diabetic foot exam was performed and vascular and neurological findings reveal o pathology.   Return office visit  4 months                    Told patient to return for periodic foot care and evaluation due to potential at risk complications.   Helane Gunther DPM

## 2022-11-26 ENCOUNTER — Ambulatory Visit: Payer: Medicare PPO | Admitting: Podiatry

## 2023-02-17 ENCOUNTER — Encounter (HOSPITAL_COMMUNITY): Payer: Self-pay

## 2023-02-17 ENCOUNTER — Other Ambulatory Visit: Payer: Self-pay

## 2023-02-17 ENCOUNTER — Emergency Department (HOSPITAL_COMMUNITY): Payer: Medicare PPO

## 2023-02-17 ENCOUNTER — Emergency Department (HOSPITAL_COMMUNITY)
Admission: EM | Admit: 2023-02-17 | Discharge: 2023-02-17 | Disposition: A | Discharge status: Home / Self Care | Payer: Medicare PPO | Attending: Emergency Medicine | Admitting: Emergency Medicine

## 2023-02-17 DIAGNOSIS — K567 Ileus, unspecified: Secondary | ICD-10-CM | POA: Insufficient documentation

## 2023-02-17 DIAGNOSIS — Z79899 Other long term (current) drug therapy: Secondary | ICD-10-CM | POA: Insufficient documentation

## 2023-02-17 DIAGNOSIS — D171 Benign lipomatous neoplasm of skin and subcutaneous tissue of trunk: Secondary | ICD-10-CM | POA: Insufficient documentation

## 2023-02-17 DIAGNOSIS — E1165 Type 2 diabetes mellitus with hyperglycemia: Secondary | ICD-10-CM | POA: Diagnosis not present

## 2023-02-17 DIAGNOSIS — I10 Essential (primary) hypertension: Secondary | ICD-10-CM | POA: Insufficient documentation

## 2023-02-17 DIAGNOSIS — K529 Noninfective gastroenteritis and colitis, unspecified: Secondary | ICD-10-CM | POA: Diagnosis not present

## 2023-02-17 DIAGNOSIS — Z7984 Long term (current) use of oral hypoglycemic drugs: Secondary | ICD-10-CM | POA: Insufficient documentation

## 2023-02-17 DIAGNOSIS — R1012 Left upper quadrant pain: Secondary | ICD-10-CM | POA: Diagnosis present

## 2023-02-17 LAB — CBC WITH DIFFERENTIAL/PLATELET
Abs Immature Granulocytes: 0.05 10*3/uL (ref 0.00–0.07)
Basophils Absolute: 0 10*3/uL (ref 0.0–0.1)
Basophils Relative: 0 %
Eosinophils Absolute: 0.1 10*3/uL (ref 0.0–0.5)
Eosinophils Relative: 1 %
HCT: 43.8 % (ref 36.0–46.0)
Hemoglobin: 13.3 g/dL (ref 12.0–15.0)
Immature Granulocytes: 1 %
Lymphocytes Relative: 11 %
Lymphs Abs: 1.1 10*3/uL (ref 0.7–4.0)
MCH: 25.7 pg — ABNORMAL LOW (ref 26.0–34.0)
MCHC: 30.4 g/dL (ref 30.0–36.0)
MCV: 84.7 fL (ref 80.0–100.0)
Monocytes Absolute: 0.5 10*3/uL (ref 0.1–1.0)
Monocytes Relative: 5 %
Neutro Abs: 8 10*3/uL — ABNORMAL HIGH (ref 1.7–7.7)
Neutrophils Relative %: 82 %
Platelets: 524 10*3/uL — ABNORMAL HIGH (ref 150–400)
RBC: 5.17 MIL/uL — ABNORMAL HIGH (ref 3.87–5.11)
RDW: 15 % (ref 11.5–15.5)
WBC: 9.7 10*3/uL (ref 4.0–10.5)
nRBC: 0 % (ref 0.0–0.2)

## 2023-02-17 LAB — COMPREHENSIVE METABOLIC PANEL
ALT: 17 U/L (ref 0–44)
AST: 15 U/L (ref 15–41)
Albumin: 3.8 g/dL (ref 3.5–5.0)
Alkaline Phosphatase: 70 U/L (ref 38–126)
Anion gap: 6 (ref 5–15)
BUN: 10 mg/dL (ref 6–20)
CO2: 27 mmol/L (ref 22–32)
Calcium: 8.6 mg/dL — ABNORMAL LOW (ref 8.9–10.3)
Chloride: 101 mmol/L (ref 98–111)
Creatinine, Ser: 0.78 mg/dL (ref 0.44–1.00)
GFR, Estimated: >60 mL/min (ref >60)
Glucose, Bld: 214 mg/dL — ABNORMAL HIGH (ref 70–99)
Potassium: 4 mmol/L (ref 3.5–5.1)
Sodium: 134 mmol/L — ABNORMAL LOW (ref 135–145)
Total Bilirubin: 0.5 mg/dL (ref 0.3–1.2)
Total Protein: 8.1 g/dL (ref 6.5–8.1)

## 2023-02-17 LAB — LIPASE, BLOOD: Lipase: 43 U/L (ref 11–51)

## 2023-02-17 MED ORDER — FENTANYL CITRATE PF 50 MCG/ML IJ SOSY
50.0000 ug | PREFILLED_SYRINGE | Freq: Once | INTRAMUSCULAR | Status: AC
  Administered 2023-02-17: 50 ug via INTRAVENOUS
  Filled 2023-02-17: qty 1

## 2023-02-17 MED ORDER — FAMOTIDINE IN NACL 20-0.9 MG/50ML-% IV SOLN
20.0000 mg | Freq: Once | INTRAVENOUS | Status: AC
  Administered 2023-02-17: 20 mg via INTRAVENOUS
  Filled 2023-02-17: qty 50

## 2023-02-17 MED ORDER — FAMOTIDINE 20 MG PO TABS: 20.0000 mg | ORAL_TABLET | Freq: Two times a day (BID) | ORAL | 0 refills | Status: AC

## 2023-02-17 MED ORDER — KETOROLAC TROMETHAMINE 15 MG/ML IJ SOLN
15.0000 mg | Freq: Once | INTRAMUSCULAR | Status: AC
  Administered 2023-02-17: 15 mg via INTRAVENOUS
  Filled 2023-02-17: qty 1

## 2023-02-17 MED ORDER — DICYCLOMINE HCL 20 MG PO TABS: 20.0000 mg | ORAL_TABLET | Freq: Two times a day (BID) | ORAL | 0 refills | Status: AC | PRN

## 2023-02-17 MED ORDER — ONDANSETRON HCL 4 MG PO TABS: 4.0000 mg | ORAL_TABLET | Freq: Four times a day (QID) | ORAL | 0 refills | Status: AC | PRN

## 2023-02-17 MED ORDER — ONDANSETRON HCL 4 MG/2ML IJ SOLN
4.0000 mg | Freq: Once | INTRAMUSCULAR | Status: AC
  Administered 2023-02-17: 4 mg via INTRAVENOUS
  Filled 2023-02-17: qty 2

## 2023-02-17 MED ORDER — IOHEXOL 300 MG/ML  SOLN
100.0000 mL | Freq: Once | INTRAMUSCULAR | Status: AC | PRN
  Administered 2023-02-17: 100 mL via INTRAVENOUS

## 2023-02-17 NOTE — ED Triage Notes (Signed)
BIBA. Patient reports LUQ pain x2 years but today woke her up out her sleep at 0430.

## 2023-02-17 NOTE — ED Notes (Signed)
Patient transported to CT 

## 2023-02-17 NOTE — ED Provider Notes (Signed)
[place] EMERGENCY DEPARTMENT AT [facility] Provider Note   CSN: [id number] Arrival date & time: 02/17/23  1308     History  Chief Complaint  Patient presents with   Abdominal Pain    Patricia Rodriguez is a 57 y.o. female.  Patient is a 57 year old female with a history of diabetes, hypertension, bipolar disease who is presenting today with abdominal pain.  Patient reports that for years now she has had an occasional pain in the left upper quadrant but it is usually minimal happens maybe 1 time a week and may be related to eating.  She is in the past they have told her it is IBS but today she woke up in the middle the night with severe pain in her upper abdomen.  She has had nausea as well and tried to induce vomiting but only had dry heaving.  The pain has continued and is all across her upper abdomen.  She denies any lower abdominal pain, diarrhea, urinary issues.  She has been compliant with her amlodipine but reports has not taken her metformin for over a year because she reports she has been stubborn.  She denies any alcohol use, significant NSAID use.  No prior abdominal surgeries.  No fever cough or congestion recently.  She denies any shortness of breath.  The history is provided by the patient.  Abdominal Pain      Home Medications Prior to Admission medications   Medication Sig Start Date End Date Taking? Authorizing Provider  dicyclomine (BENTYL) 20 MG tablet Take 1 tablet (20 mg total) by mouth 2 (two) times daily as needed for spasms. 02/17/23  Yes Gwyneth Sprout, MD  famotidine (PEPCID) 20 MG tablet Take 1 tablet (20 mg total) by mouth 2 (two) times daily. 02/17/23  Yes Azya Barbero, Alphonzo Lemmings, MD  ondansetron (ZOFRAN) 4 MG tablet Take 1 tablet (4 mg total) by mouth every 6 (six) hours as needed for nausea or vomiting. 02/17/23  Yes Zabian Swayne, Alphonzo Lemmings, MD  ARIPiprazole (ABILIFY) 15 MG tablet Take 15 mg by mouth every evening.     [provider]   atorvastatin (LIPITOR) 20 MG tablet Take 20 mg by mouth every evening.     [provider]  benztropine (COGENTIN) 1 MG tablet Take 1 mg by mouth at bedtime.     [provider]  busPIRone (BUSPAR) 10 MG tablet Take 10 mg by mouth 3 (three) times daily.    [provider]  cyclobenzaprine (FLEXERIL) 10 MG tablet Take 1 tablet (10 mg total) by mouth 2 (two) times daily as needed for muscle spasms. 10/21/21   Fayrene Helper, PA-C  gabapentin (NEURONTIN) 100 MG capsule Take 100 mg by mouth 3 (three) times daily.    [provider]  hydrochlorothiazide (HYDRODIURIL) 25 MG tablet Take 25 mg by mouth daily.    [provider]  ibuprofen (ADVIL) 600 MG tablet Take 1 tablet (600 mg total) by mouth every 6 (six) hours as needed. 10/21/21   Fayrene Helper, PA-C  metFORMIN (GLUCOPHAGE) 500 MG tablet Take 1 tablet (500 mg total) by mouth daily with breakfast. For high blood sugars Patient taking differently: Take 1,000 mg by mouth 2 (two) times daily with a meal. For high blood sugars 05/12/18   Aldean Baker, NP  prazosin (MINIPRESS) 1 MG capsule Take 1 mg by mouth at bedtime.    [provider]  triamcinolone cream (KENALOG) 0.5 % Apply 1 application topically 2 (two) times daily as needed (  eczema).  09/28/19   [provider]  vortioxetine HBr (TRINTELLIX) 20 MG TABS tablet Take 20 mg by mouth daily.    [provider]      Allergies    Hydrocodone-acetaminophen, Oxycodone, and Penicillins    Review of Systems   Review of Systems  Gastrointestinal:  Positive for abdominal pain.    Physical Exam Updated Vital Signs BP (!) 154/100 (BP Location: Left Arm)   Pulse 91   Temp 98.8 F (37.1 C) (Oral)   Resp 16   Ht 5' (1.524 m)   Wt 98.4 kg   SpO2 99%   BMI 42.37 kg/m  Physical Exam Vitals and nursing note reviewed.  Constitutional:      General: She is not in acute distress.    Appearance: She is well-developed.  HENT:     Head:  Normocephalic and atraumatic.  Eyes:     Pupils: Pupils are equal, round, and reactive to light.  Cardiovascular:     Rate and Rhythm: Normal rate and regular rhythm.     Heart sounds: Normal heart sounds. No murmur heard.    No friction rub.  Pulmonary:     Effort: Pulmonary effort is normal.     Breath sounds: Normal breath sounds. No wheezing or rales.  Abdominal:     General: Bowel sounds are normal. There is no distension.     Palpations: Abdomen is soft.     Tenderness: There is abdominal tenderness in the epigastric area and left upper quadrant. There is no right CVA tenderness, left CVA tenderness, guarding or rebound.  Musculoskeletal:        General: No tenderness. Normal range of motion.     Comments: No edema  Skin:    General: Skin is warm and dry.     Findings: No rash.  Neurological:     Mental Status: She is alert and oriented to person, place, and time.     Cranial Nerves: No cranial nerve deficit.  Psychiatric:        Behavior: Behavior normal.     ED Results / Procedures / Treatments   Labs (all labs ordered are listed, but only abnormal results are displayed) Labs Reviewed  CBC WITH DIFFERENTIAL/PLATELET - Abnormal; Notable for the following components:      Result Value   RBC 5.17 (*)    MCH 25.7 (*)    Platelets 524 (*)    Neutro Abs 8.0 (*)    All other components within normal limits  COMPREHENSIVE METABOLIC PANEL - Abnormal; Notable for the following components:   Sodium 134 (*)    Glucose, Bld 214 (*)    Calcium 8.6 (*)    All other components within normal limits  LIPASE, BLOOD    EKG EKG Interpretation Date/Time:  Thursday February 17 2023 09:41:45 EDT Ventricular Rate:  78 PR Interval:  152 QRS Duration:  98 QT Interval:  360 QTC Calculation: 410 R Axis:   28  Text Interpretation: Normal sinus rhythm Normal ECG When compared with ECG of 02-May-2018 12:14, PREVIOUS ECG IS PRESENT Confirmed by Gwyneth Sprout ([id number]) on 02/17/2023  10:13:48 AM  Radiology CT ABDOMEN PELVIS W CONTRAST  Result Date: 02/17/2023 CLINICAL DATA:  Acute on chronic left upper quadrant abdominal pain waking patient from sleep. EXAM: CT ABDOMEN AND PELVIS WITH CONTRAST TECHNIQUE: Multidetector CT imaging of the abdomen and pelvis was performed using the standard protocol following bolus administration of intravenous contrast. RADIATION DOSE REDUCTION: This exam was performed  according to the departmental dose-optimization program which includes automated exposure control, adjustment of the mA and/or kV according to patient size and/or use of iterative reconstruction technique. CONTRAST:  OMNIPAQUE IOHEXOL 300 MG/ML  SOLN COMPARISON:  CT abdomen dated 03/23/2007 FINDINGS: Lower chest: Similar elevation of the left hemidiaphragm with compressive atelectasis of the left lower lobe. No focal consolidation or pulmonary nodule in the lung bases. No pleural effusion or pneumothorax demonstrated. Partially imaged heart size is normal. Hepatobiliary: No focal hepatic lesions. No intra or extrahepatic biliary ductal dilation. Normal gallbladder. Pancreas: No focal lesions or main ductal dilation. Spleen: Normal in size without focal abnormality. Adrenals/Urinary Tract: No adrenal nodules. No suspicious renal mass, calculi or hydronephrosis. No focal bladder wall thickening. Stomach/Bowel: Mild mural thickening of the gastric antrum. Diffuse small bowel dilation involving the mid to distal small bowel with circumferential mural thickening of the distal ileum, where there is gradual luminal narrowing to the level of the terminal ileum. Colonic diverticulosis with mural thickening and pericolonic stranding of the distal descending colon and proximal sigmoid colon where there was diverticulitis previously. Normal appendix. Vascular/Lymphatic: Aortic atherosclerosis. 12 mm central mesenteric lymph node (2:61). Reproductive: No adnexal masses. Other: Small volume free fluid.  No fluid collections. Ovoid fat density mass resulting in mass effect centered within the right hemipelvis measures 10.8 x 6.1 cm (2:81). Musculoskeletal: No acute or abnormal lytic or blastic osseous lesions. Multifocal punctate metallic radiodensities within the anterior abdominal wall. Small fat-containing paraumbilical hernia. IMPRESSION: 1. Diffuse small bowel dilation involving the mid to distal small bowel with circumferential mural thickening of the distal ileum, where there is gradual luminal narrowing to the level of the terminal ileum. Findings are suspicious enteritis, either infectious or inflammatory, with ileus. 2. Colonic diverticulosis with mural thickening and pericolonic stranding of the distal descending colon and proximal sigmoid colon where there was diverticulitis previously. Findings may reflect acute uncomplicated diverticulitis or sequela of prior diverticulitis. Consider correlation with colonoscopy once acute symptoms have resolved to exclude underlying mass lesion in this area if one has not been performed recently. 3. Ovoid fat density mass resulting in mass effect centered within the right hemipelvis measures 10.8 x 6.1 cm, likely a lipoma or atypical lipomatous tumor. No internal enhancing components. Recommend surgical consultation. 4. Mild mural thickening of the gastric antrum, which may be reactive or reflect gastritis. 5. Central mesenteric lymphadenopathy, likely reactive. 6.  Aortic Atherosclerosis (ICD10-I70.0). Electronically Signed   By: Agustin Cree M.D.   On: 02/17/2023 13:45    Procedures Procedures    Medications Ordered in ED Medications  ondansetron (ZOFRAN) injection 4 mg (4 mg Intravenous Given 02/17/23 0858)  famotidine (PEPCID) IVPB 20 mg premix (0 mg Intravenous Stopped 02/17/23 1327)  fentaNYL (SUBLIMAZE) injection 50 mcg (50 mcg Intravenous Given 02/17/23 0859)  iohexol (OMNIPAQUE) 300 MG/ML solution 100 mL (100 mLs Intravenous Contrast Given 02/17/23  1057)  ketorolac (TORADOL) 15 MG/ML injection 15 mg (15 mg Intravenous Given 02/17/23 1327)    ED Course/ Medical Decision Making/ A&P                                 Medical Decision Making Amount and/or Complexity of Data Reviewed Labs: ordered. Decision-making details documented in ED Course. Radiology: ordered and independent interpretation performed. Decision-making details documented in ED Course. ECG/medicine tests: ordered and independent interpretation performed. Decision-making details documented in ED Course.  Risk Prescription drug management.  Pt with multiple medical problems and comorbidities and presenting today with a complaint that caries a high risk for morbidity and mortality.  Here today with severe upper abdominal pain.  Concern for perforated ulcer versus pancreatitis versus hepatitis versus cholecystitis.  Patient has no lower abdominal pain.  The lower abdomen is soft and nontender with lower suspicion for appendicitis, diverticulitis, kidney stone, pyelonephritis or pelvic pathology.  Low suspicion for cardiac or respiratory causes today but will do a screening EKG.  Patient given pain and nausea control.  She reports being totally fine yesterday and low suspicion for dehydration at this time.  2:39 PM I independently interpreted patient's labs and EKG.  EKG is within normal limits and low suspicion for cardiac cause at this time.  CBC, CMP, lipase all within normal limits except for hyperglycemia which is known.  No evidence of hepatitis or pancreatitis today.  Imaging ordered due to patient's symptoms.  I have independently visualized and interpreted pt's images today.  CT today without evidence of hydronephrosis, gallbladder pathology or pancreatitis.  However patient does have dilated small bowel loops.  Radiology reports diffuse small bowel dilation involving the mid to distal small bowel with circumferential mural thickening of the distal ileum where there is  gradual luminal narrowing to the level of the terminal ileum.  There reports findings are suspicious for enteritis either infectious or inflammatory.  Also colonic diverticulosis with mural thickening and pericolonic stranding of the distal descending colon and proximal sigmoid colon where it looks like there is been diverticulitis previously the patient has no lower abdominal pain today to suggest diverticulitis at this time.  She reports her last colonoscopy was 3 years ago and she had a few polyps removed but otherwise was told it was normal.  Discussed these results with the patient and she will follow-up with PCP who can reach out to GI to see if she needs further evaluation.  Also radiology noted the patient has an ovoid fat density mass in the right hemipelvis which measures 10 x 6 cm which is likely a lipoma.  Discussed this with the patient and given information for surgical follow-up.  She does have some mild mural thickening as well of the gastric antrum which may reflect some gastritis and was given Pepcid to take at home.  All these findings were discussed with the patient.  At this time her pain is controlled she has had no vomiting here will discharge home with antispasmodics, antiemetics and Pepcid.  Will have her start liquid diet and progress diet as she can tolerate.  Patient given return precautions.  She is comfortable with this plan.  No indication for admission at this time.          Final Clinical Impression(s) / ED Diagnoses Final diagnoses:  Enteritis  Ileus (HCC)  Lipoma of torso    Rx / DC Orders ED Discharge Orders          Ordered    dicyclomine (BENTYL) 20 MG tablet  2 times daily PRN        02/17/23 1437    ondansetron (ZOFRAN) 4 MG tablet  Every 6 hours PRN        02/17/23 1437    famotidine (PEPCID) 20 MG tablet  2 times daily        02/17/23 1437              Gwyneth Sprout, MD 02/17/23 1443

## 2023-02-17 NOTE — Discharge Instructions (Addendum)
On your CAT scan today it shows that you have an enteritis which is an inflammation of your small bowel and it is sluggish most likely from a virus.  It will be important for you to just initially try to drink or eat very small amounts.  I would try broths first.  It will take several days to to get better but as it is getting better then you can revert back to what you normally would eat.  You can use the spasm and antinausea medication as needed.  Take Tylenol as well as needed for pain.  You are also given a medication to help with acid in your stomach over the next 2 weeks.  However also on your CAT scan that showed several other things.  One was a lipoma that they recommended you follow-up with general surgery for.  If you call the office and tell them you are here they should be able to see all those results and schedule you a follow-up.  It also looks like in the past you may have had diverticulitis.  I know that your colonoscopy 3 years ago looked okay but your primary physician can reach out to gastroenterology.  They can also see these results and decide if you need any further evaluation at this time.  If you start having repetitive vomiting, severe uncontrolled pain, fever or other concerns return to the emergency room.

## 2023-03-23 ENCOUNTER — Other Ambulatory Visit: Payer: Self-pay | Admitting: Surgery

## 2023-03-23 DIAGNOSIS — R1900 Intra-abdominal and pelvic swelling, mass and lump, unspecified site: Secondary | ICD-10-CM

## 2023-05-06 ENCOUNTER — Ambulatory Visit
Admission: RE | Admit: 2023-05-06 | Discharge: 2023-05-06 | Disposition: A | Discharge status: Home / Self Care | Payer: Medicare PPO | Source: Ambulatory Visit | Attending: Surgery | Admitting: Surgery

## 2023-05-06 DIAGNOSIS — R1900 Intra-abdominal and pelvic swelling, mass and lump, unspecified site: Secondary | ICD-10-CM

## 2023-05-06 MED ORDER — GADOPICLENOL 0.5 MMOL/ML IV SOLN
9.0000 mL | Freq: Once | INTRAVENOUS | Status: AC | PRN
  Administered 2023-05-06: 9 mL via INTRAVENOUS

## 2023-05-16 ENCOUNTER — Ambulatory Visit: Payer: Self-pay | Admitting: Surgery

## 2023-05-16 DIAGNOSIS — R739 Hyperglycemia, unspecified: Secondary | ICD-10-CM

## 2024-02-09 ENCOUNTER — Other Ambulatory Visit: Payer: Self-pay

## 2024-02-09 ENCOUNTER — Ambulatory Visit
Admission: EM | Admit: 2024-02-09 | Discharge: 2024-02-09 | Disposition: A | Discharge status: Home / Self Care | Attending: Family Medicine | Admitting: Family Medicine

## 2024-02-09 DIAGNOSIS — R300 Dysuria: Secondary | ICD-10-CM

## 2024-02-09 LAB — POCT URINE DIPSTICK
Bilirubin, UA: NEGATIVE
Blood, UA: NEGATIVE
Glucose, UA: NEGATIVE mg/dL
Ketones, POC UA: NEGATIVE mg/dL
Leukocytes, UA: NEGATIVE
Nitrite, UA: NEGATIVE
POC PROTEIN,UA: NEGATIVE
Spec Grav, UA: 1.01 (ref 1.010–1.025)
Urobilinogen, UA: 0.2 U/dL
pH, UA: 5.5 (ref 5.0–8.0)

## 2024-02-09 NOTE — Discharge Instructions (Addendum)
 Your urine was negative for UTI.  You use Azo over-the-counter for your symptoms as needed.  Focus on hydration rest and follow-up with your PCP if your symptoms do not improve.  Please go to the ER for any worsening symptoms.  Hope you feel better soon!

## 2024-02-09 NOTE — ED Provider Notes (Signed)
 [facility]    CSN: [id number] Arrival date & time: 02/09/24  1811      History   Chief Complaint No chief complaint on file.   HPI Patricia Rodriguez is a 58 y.o. female presents for dysuria.  Patient reports 3 days of urinary frequency with urgency and suprapubic pressure.  No burning with urination, hematuria, fevers, vomiting or back pain.  No vaginal discharge or STD concern.  Denies history of frequent UTIs.  No other concerns at this time  HPI  Past Medical History:  Diagnosis Date   Anemia    Anxiety    Bipolar disorder (HCC)    Depression    Diabetes mellitus without complication (HCC)    Headache(784.0)    Hypertension    IBS (irritable bowel syndrome)    Mental disorder     Patient Active Problem List   Diagnosis Date Noted   Diabetes mellitus without complication (HCC) 07/27/2022   Major depression, recurrent 05/03/2018   Anticholinergic drug overdose 05/01/2018   Suicide attempt (HCC) 05/01/2018   Thrombocytosis 05/01/2018   HTN (hypertension) 05/01/2018   Type 2 diabetes mellitus (HCC) 05/01/2018   Severe recurrent major depression without psychotic features (HCC) 07/11/2014   Hypokalemia 07/02/2014   Overdose of benzodiazepine 07/01/2014   Bipolar disorder (HCC) 07/01/2014   Suicidal ideation 07/01/2014   UTI (urinary tract infection) 07/01/2014   ONYCHOMYCOSIS 06/16/2006   Obesity, morbid (HCC) 06/16/2006   INCONTINENCE, STRESS, FEMALE 06/16/2006   DYSFUNCTIONAL UTERINE BLEEDING 06/16/2006   ECZEMA, ATOPIC DERMATITIS 06/16/2006    Past Surgical History:  Procedure Laterality Date   BIOPSY  11/06/2019   Procedure: BIOPSY;  Surgeon: Elicia Claw, MD;  Location: [facility];  Service: Gastroenterology;;  EGD and COLON   COLONOSCOPY WITH PROPOFOL  N/A 11/06/2019   Procedure: COLONOSCOPY WITH PROPOFOL ;  Surgeon: Elicia Claw, MD;  Location: [facility];  Service: Gastroenterology;  Laterality: N/A;    ESOPHAGOGASTRODUODENOSCOPY (EGD) WITH PROPOFOL  N/A 11/06/2019   Procedure: ESOPHAGOGASTRODUODENOSCOPY (EGD) WITH PROPOFOL ;  Surgeon: Elicia Claw, MD;  Location: [facility];  Service: Gastroenterology;  Laterality: N/A;   ESOPHAGOGASTRODUODENOSCOPY (EGD) WITH PROPOFOL  N/A 12/19/2019   Procedure: ESOPHAGOGASTRODUODENOSCOPY (EGD) WITH PROPOFOL ;  Surgeon: Burnette Fallow, MD;  Location: [facility];  Service: Endoscopy;  Laterality: N/A;   EUS N/A 12/19/2019   Procedure: UPPER ENDOSCOPIC ULTRASOUND (EUS) RADIAL;  Surgeon: Burnette Fallow, MD;  Location: [facility];  Service: Endoscopy;  Laterality: N/A;  follow after EGD scope   NO PAST SURGERIES      OB History   No obstetric history on file.      Home Medications    Prior to Admission medications   Medication Sig Start Date End Date Taking? Authorizing Provider  amLODipine  (NORVASC ) 10 MG tablet Take 10 mg by mouth. 10/14/23  Yes [provider]  ARIPiprazole  (ABILIFY ) 15 MG tablet Take 15 mg by mouth every evening.     [provider]  atorvastatin (LIPITOR) 20 MG tablet Take 20 mg by mouth every evening.     [provider]  benztropine (COGENTIN) 1 MG tablet Take 1 mg by mouth at bedtime.     [provider]  busPIRone (BUSPAR) 10 MG tablet Take 10 mg by mouth 3 (three) times daily.    [provider]  cyclobenzaprine  (FLEXERIL ) 10 MG tablet Take 1 tablet (10 mg total) by mouth 2 (two) times daily as needed for muscle spasms. 10/21/21   Nivia Colon, PA-C  dicyclomine  (BENTYL ) 20 MG tablet Take  1 tablet (20 mg total) by mouth 2 (two) times daily as needed for spasms. 02/17/23   Doretha Folks, MD  famotidine  (PEPCID ) 20 MG tablet Take 1 tablet (20 mg total) by mouth 2 (two) times daily. 02/17/23   Doretha Folks, MD  gabapentin (NEURONTIN) 100 MG capsule Take 100 mg by mouth 3 (three) times daily.    [provider]  hydrochlorothiazide  (HYDRODIURIL ) 25 MG tablet Take 25 mg by  mouth daily.    [provider]  ibuprofen  (ADVIL ) 600 MG tablet Take 1 tablet (600 mg total) by mouth every 6 (six) hours as needed. 10/21/21   Nivia Colon, PA-C  metFORMIN  (GLUCOPHAGE ) 500 MG tablet Take 1 tablet (500 mg total) by mouth daily with breakfast. For high blood sugars Patient taking differently: Take 1,000 mg by mouth 2 (two) times daily with a meal. For high blood sugars 05/12/18   Wonda Clarita BRAVO, NP  metFORMIN  (GLUCOPHAGE -XR) 500 MG 24 hr tablet Take 500 mg by mouth 2 (two) times daily.    [provider]  ondansetron  (ZOFRAN ) 4 MG tablet Take 1 tablet (4 mg total) by mouth every 6 (six) hours as needed for nausea or vomiting. 02/17/23   Doretha Folks, MD  prazosin (MINIPRESS) 1 MG capsule Take 1 mg by mouth at bedtime.    [provider]  triamcinolone cream (KENALOG) 0.5 % Apply 1 application topically 2 (two) times daily as needed (eczema).  09/28/19   [provider]  vortioxetine HBr (TRINTELLIX) 20 MG TABS tablet Take 20 mg by mouth daily.    [provider]    Family History Family History  Problem Relation Age of Onset   Depression Maternal Aunt    Alcohol abuse Maternal Aunt    Drug abuse Maternal Aunt    Drug abuse Father    Alcohol abuse Paternal Aunt    Drug abuse Paternal Aunt    Alcohol abuse Maternal Uncle    Drug abuse Maternal Uncle    Alcohol abuse Paternal Uncle    Drug abuse Paternal Uncle     Social History Social History   Tobacco Use   Smoking status: Never   Smokeless tobacco: Never  Vaping Use   Vaping status: Never Used  Substance Use Topics   Alcohol use: Not Currently    Alcohol/week: 0.0 standard drinks of alcohol    Comment: socially   Drug use: No     Allergies   Hydrocodone  and Oxycodone    Review of Systems Review of Systems  Genitourinary:  Positive for dysuria.     Physical Exam Triage Vital Signs ED Triage Vitals  Encounter Vitals Group     BP 02/09/24 1902 (!)  173/99     Girls Systolic BP Percentile --      Girls Diastolic BP Percentile --      Boys Systolic BP Percentile --      Boys Diastolic BP Percentile --      Pulse Rate 02/09/24 1902 91     Resp 02/09/24 1902 18     Temp 02/09/24 1902 98 F (36.7 C)     Temp Source 02/09/24 1902 Oral     SpO2 02/09/24 1902 98 %     Weight --      Height --      Head Circumference --      Peak Flow --      Pain Score 02/09/24 1858 3     Pain Loc --  Pain Education --      Exclude from Growth Chart --    No data found.  Updated Vital Signs BP (!) 173/99   Pulse 91   Temp 98 F (36.7 C) (Oral)   Resp 18   SpO2 98%   Visual Acuity Right Eye Distance:   Left Eye Distance:   Bilateral Distance:    Right Eye Near:   Left Eye Near:    Bilateral Near:     Physical Exam Vitals and nursing note reviewed.  Constitutional:      Appearance: Normal appearance.  HENT:     Head: Normocephalic and atraumatic.  Eyes:     Pupils: Pupils are equal, round, and reactive to light.  Cardiovascular:     Rate and Rhythm: Normal rate.  Pulmonary:     Effort: Pulmonary effort is normal.  Skin:    General: Skin is warm and dry.  Neurological:     General: No focal deficit present.     Mental Status: She is alert and oriented to person, place, and time.  Psychiatric:        Mood and Affect: Mood normal.        Behavior: Behavior normal.      UC Treatments / Results  Labs (all labs ordered are listed, but only abnormal results are displayed) Labs Reviewed  POCT URINE DIPSTICK - Abnormal    EKG   Radiology No results found.  Procedures Procedures (including critical care time)  Medications Ordered in UC Medications - No data to display  Initial Impression / Assessment and Plan / UC Course  I have reviewed the triage vital signs and the nursing notes.  Pertinent labs & imaging results that were available during my care of the patient were reviewed by me and considered in my  medical decision making (see chart for details).     Reviewed exam and symptoms with patient.  Urine is negative for UTI, discussed likely cystitis and symptomatic treatment.  She can do Azo OTC as needed.  Encouraged rest fluids and PCP follow-up if symptoms do not improve.  ER precautions reviewed. Final Clinical Impressions(s) / UC Diagnoses   Final diagnoses:  Dysuria     Discharge Instructions      Your urine was negative for UTI.  You use Azo over-the-counter for your symptoms as needed.  Focus on hydration rest and follow-up with your PCP if your symptoms do not improve.  Please go to the ER for any worsening symptoms.  Hope you feel better soon!    ED Prescriptions   None    PDMP not reviewed this encounter.   Loreda Myla SAUNDERS, NP 02/09/24 [phone]

## 2024-02-09 NOTE — ED Triage Notes (Signed)
 Pt c/o dysuria, frequent urinationx3d

## 2024-03-22 ENCOUNTER — Ambulatory Visit
Admission: EM | Admit: 2024-03-22 | Discharge: 2024-03-22 | Disposition: A | Discharge status: Home / Self Care | Attending: Family Medicine | Admitting: Family Medicine

## 2024-03-22 DIAGNOSIS — L309 Dermatitis, unspecified: Secondary | ICD-10-CM

## 2024-03-22 DIAGNOSIS — L299 Pruritus, unspecified: Secondary | ICD-10-CM | POA: Diagnosis not present

## 2024-03-22 MED ORDER — CETIRIZINE HCL 10 MG PO CHEW: 10.0000 mg | CHEWABLE_TABLET | Freq: Every day | ORAL | 0 refills | Status: AC

## 2024-03-22 MED ORDER — TRIAMCINOLONE ACETONIDE 0.1 % EX CREA: 1.0000 | TOPICAL_CREAM | Freq: Two times a day (BID) | CUTANEOUS | 0 refills | Status: AC

## 2024-03-22 NOTE — Discharge Instructions (Signed)
 Start cetirizine allergy medicine daily.  You may use triamcinolone topical steroid cream twice daily to the affected areas as needed for itching.  Please follow-up with your PCP if your symptoms do not improve.  Please go to the ER for any worsening symptoms.  Hope you feel better soon!

## 2024-03-22 NOTE — ED Triage Notes (Signed)
 Pt present with c/o a rash x 3 weeks. Pt states the rash has not gone away. C/o a burning itch. States she has applied eczema lotion for relief. Pt states she does not see a rash or bumps but does feel itchy.

## 2024-03-22 NOTE — ED Provider Notes (Signed)
 [facility]    CSN: [id number] Arrival date & time: 03/22/24  1342      History   Chief Complaint Chief Complaint  Patient presents with   Rash    HPI Patricia Rodriguez is a 58 y.o. female presents for rash/itching.  Patient reports 3 weeks of a intermittent itching sensation on her arms chest back and legs.  She states she thinks she sees a little bit of rash on her left forearm but otherwise no visible rash in other areas.  Does have eczema and uses over-the-counter lotions for this.  She denies any new contacts including soaps, medications, detergents, excetra.  No lip/throat/tongue swelling, difficulty breathing or swallowing.  No abdominal pain.  She has not been using any OTC antihistamines since symptoms began.  No other concerns at this time.   Rash   Past Medical History:  Diagnosis Date   Anemia    Anxiety    Bipolar disorder (HCC)    Depression    Diabetes mellitus without complication (HCC)    Headache(784.0)    Hypertension    IBS (irritable bowel syndrome)    Mental disorder     Patient Active Problem List   Diagnosis Date Noted   Diabetes mellitus without complication (HCC) 07/27/2022   Major depression, recurrent 05/03/2018   Anticholinergic drug overdose 05/01/2018   Suicide attempt (HCC) 05/01/2018   Thrombocytosis 05/01/2018   HTN (hypertension) 05/01/2018   Type 2 diabetes mellitus (HCC) 05/01/2018   Severe recurrent major depression without psychotic features (HCC) 07/11/2014   Hypokalemia 07/02/2014   Overdose of benzodiazepine 07/01/2014   Bipolar disorder (HCC) 07/01/2014   Suicidal ideation 07/01/2014   UTI (urinary tract infection) 07/01/2014   ONYCHOMYCOSIS 06/16/2006   Obesity, morbid (HCC) 06/16/2006   INCONTINENCE, STRESS, FEMALE 06/16/2006   DYSFUNCTIONAL UTERINE BLEEDING 06/16/2006   ECZEMA, ATOPIC DERMATITIS 06/16/2006    Past Surgical History:  Procedure Laterality Date   BIOPSY  11/06/2019   Procedure: BIOPSY;   Surgeon: Elicia Claw, MD;  Location: [facility];  Service: Gastroenterology;;  EGD and COLON   COLONOSCOPY WITH PROPOFOL  N/A 11/06/2019   Procedure: COLONOSCOPY WITH PROPOFOL ;  Surgeon: Elicia Claw, MD;  Location: [facility];  Service: Gastroenterology;  Laterality: N/A;   ESOPHAGOGASTRODUODENOSCOPY (EGD) WITH PROPOFOL  N/A 11/06/2019   Procedure: ESOPHAGOGASTRODUODENOSCOPY (EGD) WITH PROPOFOL ;  Surgeon: Elicia Claw, MD;  Location: [facility];  Service: Gastroenterology;  Laterality: N/A;   ESOPHAGOGASTRODUODENOSCOPY (EGD) WITH PROPOFOL  N/A 12/19/2019   Procedure: ESOPHAGOGASTRODUODENOSCOPY (EGD) WITH PROPOFOL ;  Surgeon: Burnette Fallow, MD;  Location: [facility];  Service: Endoscopy;  Laterality: N/A;   EUS N/A 12/19/2019   Procedure: UPPER ENDOSCOPIC ULTRASOUND (EUS) RADIAL;  Surgeon: Burnette Fallow, MD;  Location: [facility];  Service: Endoscopy;  Laterality: N/A;  follow after EGD scope   NO PAST SURGERIES      OB History   No obstetric history on file.      Home Medications    Prior to Admission medications   Medication Sig Start Date End Date Taking? Authorizing Provider  cetirizine  (ZYRTEC ) 10 MG chewable tablet Chew 1 tablet (10 mg total) by mouth daily for 14 days. 03/22/24 04/05/24 Yes Cullen Lahaie, Jodi R, NP  triamcinolone  cream (KENALOG ) 0.1 % Apply 1 Application topically 2 (two) times daily. 03/22/24  Yes Shewanda Sharpe, Jodi R, NP  amLODipine  (NORVASC ) 10 MG tablet Take 10 mg by mouth. 10/14/23   [provider]  ARIPiprazole  (ABILIFY ) 15 MG tablet Take 15 mg by mouth every evening.  [provider]  atorvastatin (LIPITOR) 20 MG tablet Take 20 mg by mouth every evening.     [provider]  benztropine (COGENTIN) 1 MG tablet Take 1 mg by mouth at bedtime.     [provider]  busPIRone (BUSPAR) 10 MG tablet Take 10 mg by mouth 3 (three) times daily.    [provider]  cyclobenzaprine  (FLEXERIL ) 10 MG tablet Take 1 tablet  (10 mg total) by mouth 2 (two) times daily as needed for muscle spasms. 10/21/21   Nivia Colon, PA-C  dicyclomine  (BENTYL ) 20 MG tablet Take 1 tablet (20 mg total) by mouth 2 (two) times daily as needed for spasms. 02/17/23   Doretha Folks, MD  famotidine  (PEPCID ) 20 MG tablet Take 1 tablet (20 mg total) by mouth 2 (two) times daily. 02/17/23   Doretha Folks, MD  gabapentin (NEURONTIN) 100 MG capsule Take 100 mg by mouth 3 (three) times daily.    [provider]  hydrochlorothiazide  (HYDRODIURIL ) 25 MG tablet Take 25 mg by mouth daily.    [provider]  ibuprofen  (ADVIL ) 600 MG tablet Take 1 tablet (600 mg total) by mouth every 6 (six) hours as needed. 10/21/21   Nivia Colon, PA-C  metFORMIN  (GLUCOPHAGE ) 500 MG tablet Take 1 tablet (500 mg total) by mouth daily with breakfast. For high blood sugars Patient taking differently: Take 1,000 mg by mouth 2 (two) times daily with a meal. For high blood sugars 05/12/18   Wonda Clarita BRAVO, NP  metFORMIN  (GLUCOPHAGE -XR) 500 MG 24 hr tablet Take 500 mg by mouth 2 (two) times daily.    [provider]  ondansetron  (ZOFRAN ) 4 MG tablet Take 1 tablet (4 mg total) by mouth every 6 (six) hours as needed for nausea or vomiting. 02/17/23   Doretha Folks, MD  prazosin (MINIPRESS) 1 MG capsule Take 1 mg by mouth at bedtime.    [provider]  vortioxetine HBr (TRINTELLIX) 20 MG TABS tablet Take 20 mg by mouth daily.    [provider]    Family History Family History  Problem Relation Age of Onset   Depression Maternal Aunt    Alcohol abuse Maternal Aunt    Drug abuse Maternal Aunt    Drug abuse Father    Alcohol abuse Paternal Aunt    Drug abuse Paternal Aunt    Alcohol abuse Maternal Uncle    Drug abuse Maternal Uncle    Alcohol abuse Paternal Uncle    Drug abuse Paternal Uncle     Social History Social History   Tobacco Use   Smoking status: Never   Smokeless tobacco: Never  Vaping Use   Vaping  status: Never Used  Substance Use Topics   Alcohol use: Not Currently    Alcohol/week: 0.0 standard drinks of alcohol    Comment: socially   Drug use: No     Allergies   Hydrocodone  and Oxycodone    Review of Systems Review of Systems  Skin:  Positive for rash.       Pruritus     Physical Exam Triage Vital Signs ED Triage Vitals  Encounter Vitals Group     BP 03/22/24 1509 137/79     Girls Systolic BP Percentile --      Girls Diastolic BP Percentile --      Boys Systolic BP Percentile --      Boys Diastolic BP Percentile --      Pulse Rate 03/22/24 1508 94     Resp  03/22/24 1508 17     Temp --      Temp src --      SpO2 03/22/24 1508 100 %     Weight --      Height --      Head Circumference --      Peak Flow --      Pain Score 03/22/24 1508 0     Pain Loc --      Pain Education --      Exclude from Growth Chart --    No data found.  Updated Vital Signs BP 137/79 (BP Location: Right Arm)   Pulse 94   Resp 17   SpO2 100%   Visual Acuity Right Eye Distance:   Left Eye Distance:   Bilateral Distance:    Right Eye Near:   Left Eye Near:    Bilateral Near:     Physical Exam Vitals and nursing note reviewed.  Constitutional:      General: She is not in acute distress.    Appearance: Normal appearance. She is obese. She is not ill-appearing or toxic-appearing.  HENT:     Head: Normocephalic and atraumatic.  Eyes:     Pupils: Pupils are equal, round, and reactive to light.  Cardiovascular:     Rate and Rhythm: Normal rate.  Pulmonary:     Effort: Pulmonary effort is normal.  Skin:    General: Skin is warm and dry.         Comments: There is a small area of dried irritated skin to the left forearm consistent with eczema.  There is no wounds or rashes on face, neck, chest back right arm or legs.  Neurological:     General: No focal deficit present.     Mental Status: She is alert and oriented to person, place, and time.  Psychiatric:        Mood  and Affect: Mood normal.        Behavior: Behavior normal.      UC Treatments / Results  Labs (all labs ordered are listed, but only abnormal results are displayed) Labs Reviewed - No data to display  EKG   Radiology No results found.  Procedures Procedures (including critical care time)  Medications Ordered in UC Medications - No data to display  Initial Impression / Assessment and Plan / UC Course  I have reviewed the triage vital signs and the nursing notes.  Pertinent labs & imaging results that were available during my care of the patient were reviewed by me and considered in my medical decision making (see chart for details).     Reviewed exam and symptoms with patient.  Will do triamcinolone topically for her left forearm and she can take cetirizine daily for her pruritus.  Advised to follow-up with PCP if symptoms do not improve.  ER precautions reviewed. Final Clinical Impressions(s) / UC Diagnoses   Final diagnoses:  Pruritus  Eczema, unspecified type     Discharge Instructions      Start cetirizine allergy medicine daily.  You may use triamcinolone topical steroid cream twice daily to the affected areas as needed for itching.  Please follow-up with your PCP if your symptoms do not improve.  Please go to the ER for any worsening symptoms.  Hope you feel better soon!    ED Prescriptions     Medication Sig Dispense Auth. Provider   cetirizine (ZYRTEC) 10 MG chewable tablet Chew 1 tablet (10 mg total) by mouth  daily for 14 days. 14 tablet Nautika Cressey, Jodi R, NP   triamcinolone cream (KENALOG) 0.1 % Apply 1 Application topically 2 (two) times daily. 45 g Kamali Sakata, Jodi R, NP      PDMP not reviewed this encounter.   Loreda Myla SAUNDERS, NP 03/22/24 [id number]

## 2024-06-14 ENCOUNTER — Ambulatory Visit: Admitting: Podiatry
